# Patient Record
Sex: Female | Born: 1945 | Race: Black or African American | Hispanic: No | Marital: Single | Smoking: Former smoker
Health system: Southern US, Community
[De-identification: ages and names within clinical notes are randomized; demographics above are authoritative.]

## PROBLEM LIST (undated history)

## (undated) DIAGNOSIS — I10 Essential (primary) hypertension: Secondary | ICD-10-CM

## (undated) DIAGNOSIS — E785 Hyperlipidemia, unspecified: Secondary | ICD-10-CM

## (undated) DIAGNOSIS — M1990 Unspecified osteoarthritis, unspecified site: Secondary | ICD-10-CM

## (undated) HISTORY — DX: Unspecified osteoarthritis, unspecified site: M19.90

## (undated) HISTORY — DX: Hyperlipidemia, unspecified: E78.5

## (undated) HISTORY — DX: Essential (primary) hypertension: I10

---

## 2000-10-09 ENCOUNTER — Encounter: Admission: RE | Admit: 2000-10-09 | Discharge: 2000-10-09 | Payer: Self-pay | Admitting: Family Medicine

## 2000-10-09 ENCOUNTER — Encounter: Payer: Self-pay | Admitting: Family Medicine

## 2003-02-11 ENCOUNTER — Encounter: Admission: RE | Admit: 2003-02-11 | Discharge: 2003-02-11 | Payer: Self-pay | Admitting: Family Medicine

## 2003-02-11 ENCOUNTER — Encounter: Payer: Self-pay | Admitting: Family Medicine

## 2005-12-22 ENCOUNTER — Encounter: Admission: RE | Admit: 2005-12-22 | Discharge: 2005-12-22 | Payer: Self-pay | Admitting: Family Medicine

## 2007-04-06 IMAGING — CT CT HEAD WO/W CM
1 of 2 series · 13 of 30 positions shown, 17 images · IV contrast (omnipaque)
Comparison: none

CLINICAL DATA: Vertigo.
 HEAD CT WITHOUT AND WITH CONTRAST:
TECHNIQUE: Contiguous axial images were obtained from the base of the skull through the vertex according to standard protocol before and after administration of intravenous contrast.
 Contrast:  75 cc Omnipaque 300

[Series 2: brain · axial · 0.49mm/px · z∈[+33,+157]mm · 13 of 28 slices shown, 17 images]
[im 2/28  brain]
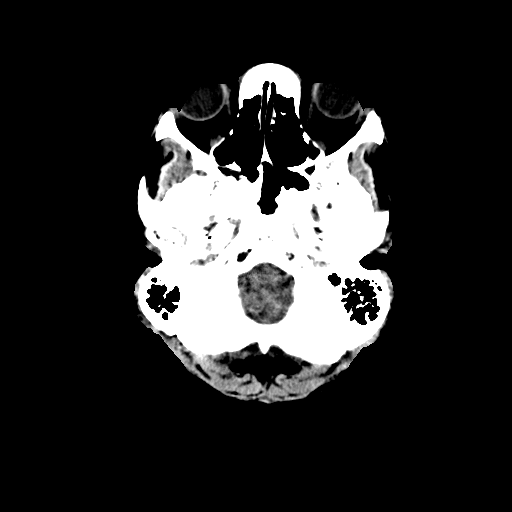
[im 2/28  bone]
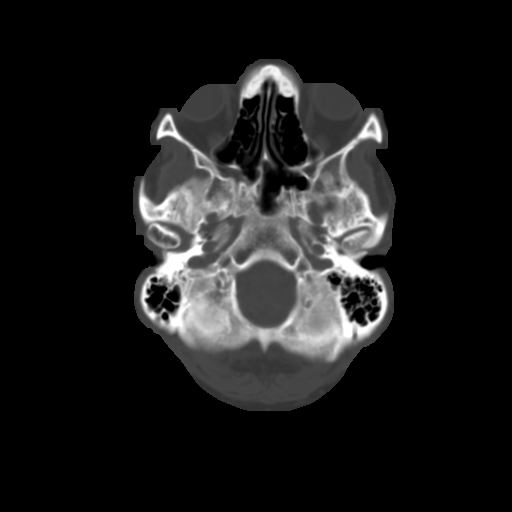
[im 4/28  brain]
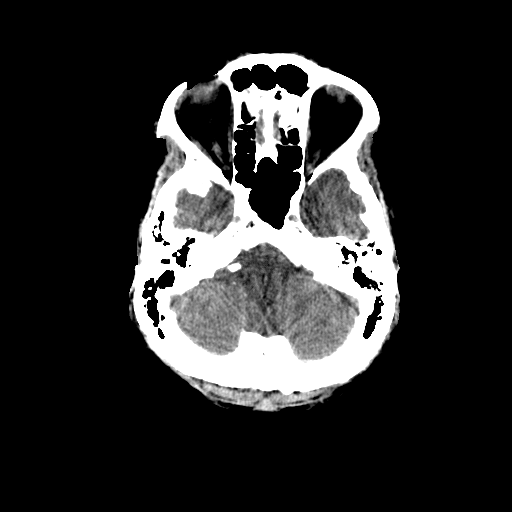
[im 6/28  brain]
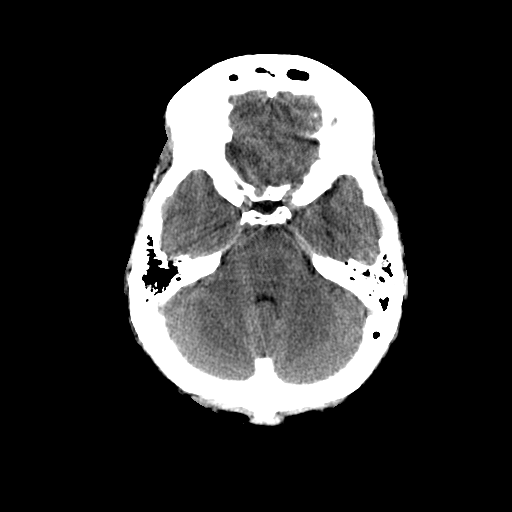
[im 8/28  brain]
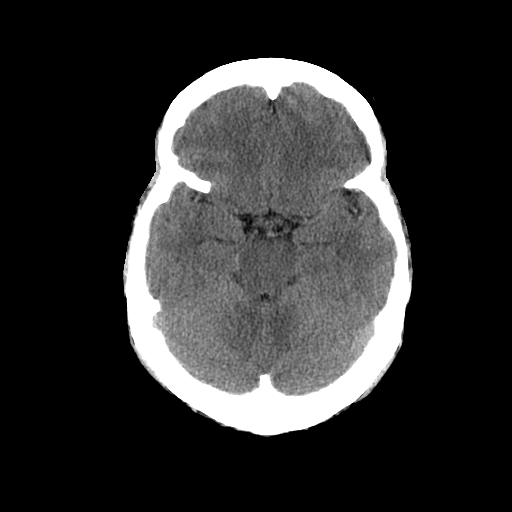
[im 10/28  brain]
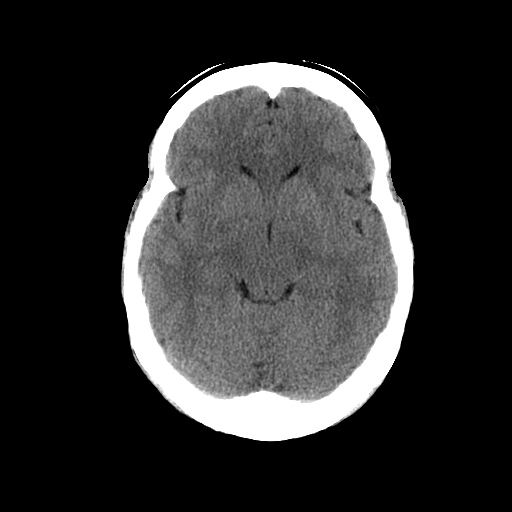
[im 10/28  bone]
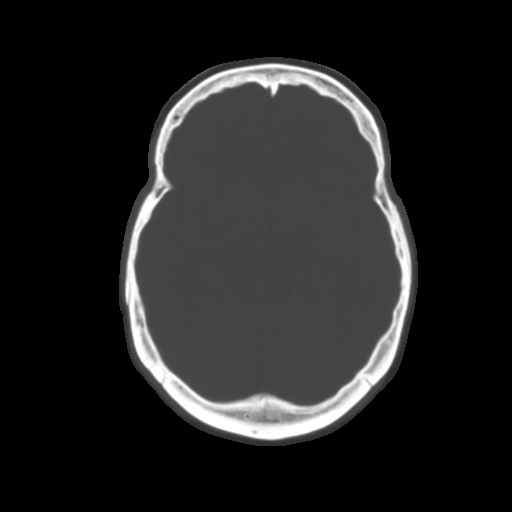
[im 12/28  brain]
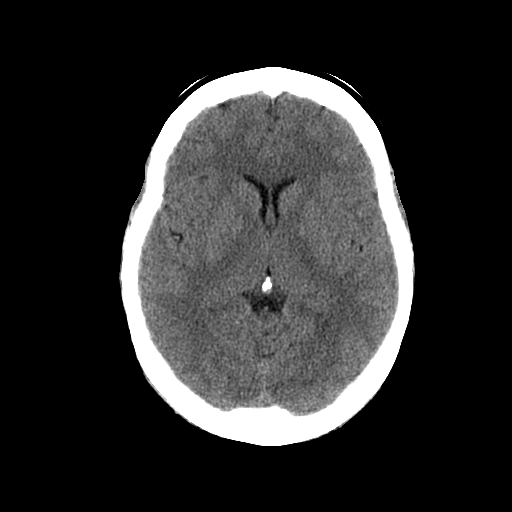
[im 14/28  brain]
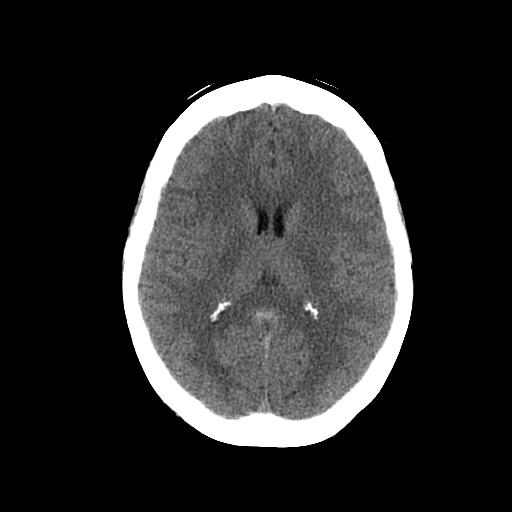
[im 16/28  brain]
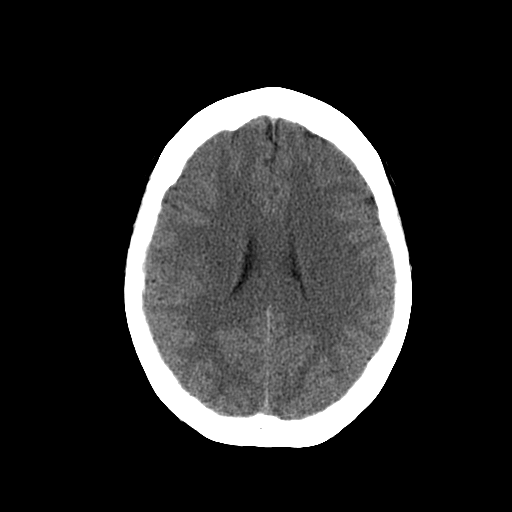
[im 18/28  brain]
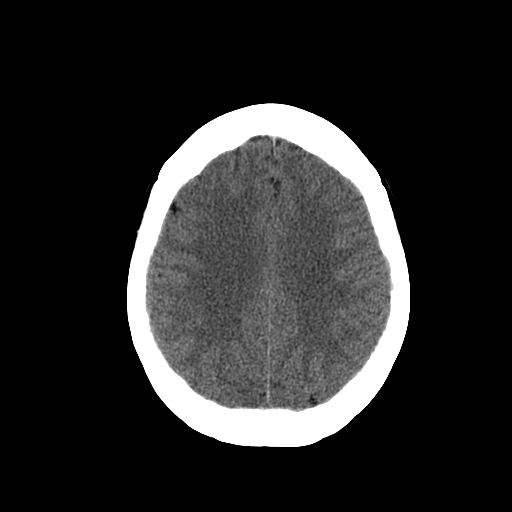
[im 18/28  bone]
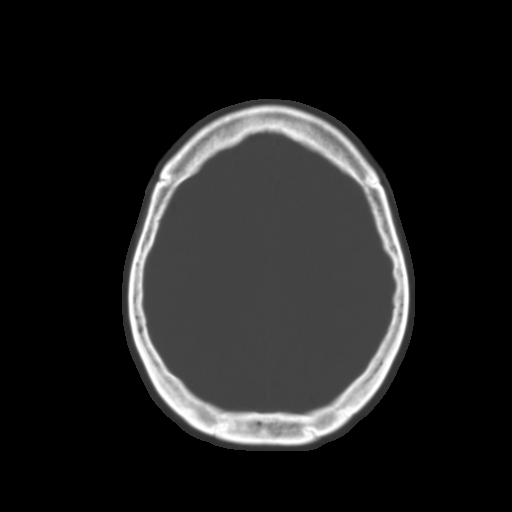
[im 20/28  brain]
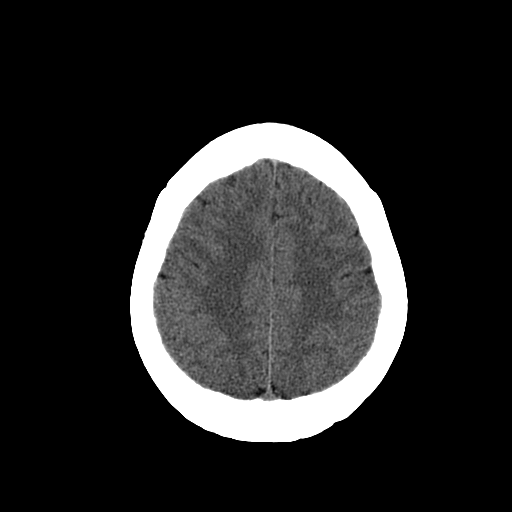
[im 22/28  brain]
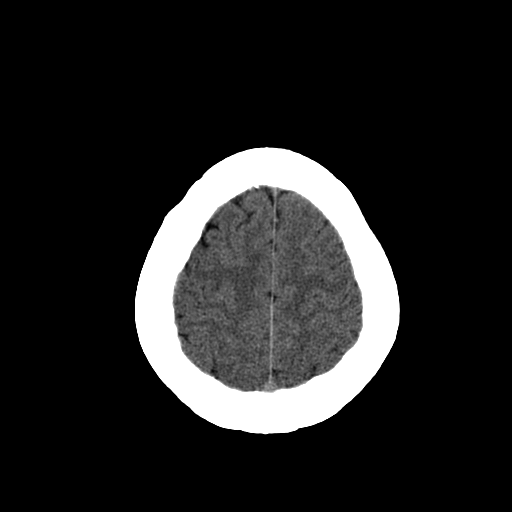
[im 24/28  brain]
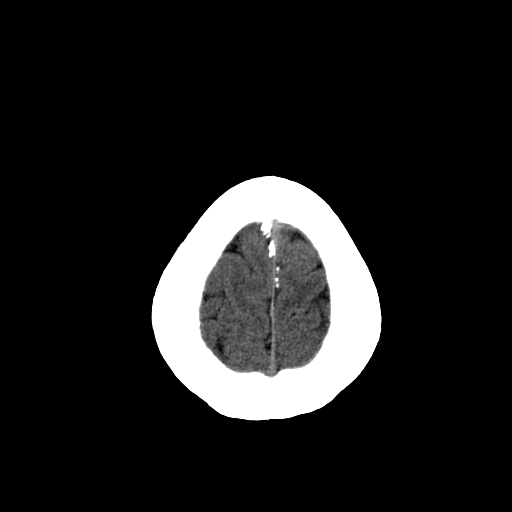
[im 26/28  brain]
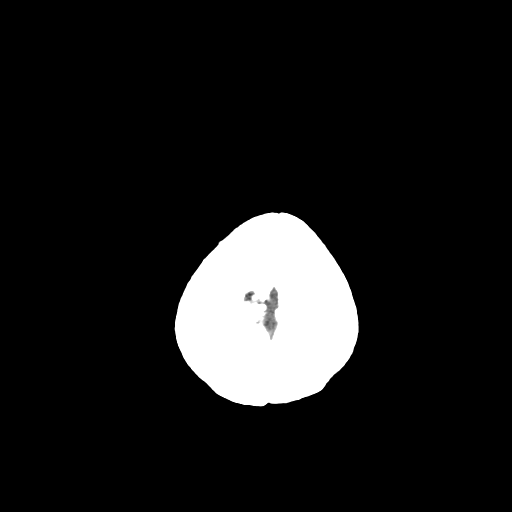
[im 26/28  bone]
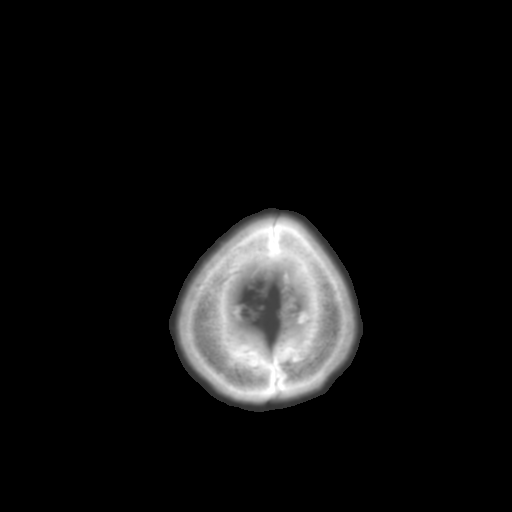

[13 of 30 positions shown; findings below may reference images not displayed]

FINDINGS: On the unenhanced portion the ventricular system is normal in size and configuration and the septum is in a normal midline position.  No acute intracranial abnormality is seen and no mass effect is noted.  After contrast administration, no enhancing lesion is seen.  On bone window images no bony abnormality is seen.  The paranasal sinuses and the mastoid air cells appear normally pneumatized.
IMPRESSION: Negative CT brain scan.  No mass or enhancing lesion is seen.

## 2011-12-26 ENCOUNTER — Other Ambulatory Visit: Payer: Self-pay | Admitting: Family Medicine

## 2011-12-26 ENCOUNTER — Ambulatory Visit
Admission: RE | Admit: 2011-12-26 | Discharge: 2011-12-26 | Disposition: A | Discharge status: Home / Self Care | Payer: Medicare Other | Source: Ambulatory Visit | Attending: Family Medicine | Admitting: Family Medicine

## 2011-12-26 DIAGNOSIS — I10 Essential (primary) hypertension: Secondary | ICD-10-CM

## 2011-12-26 DIAGNOSIS — M545 Low back pain: Secondary | ICD-10-CM

## 2012-05-28 ENCOUNTER — Other Ambulatory Visit: Payer: Self-pay | Admitting: Family Medicine

## 2012-05-28 ENCOUNTER — Encounter: Payer: Self-pay | Admitting: Gastroenterology

## 2012-05-28 DIAGNOSIS — Z1231 Encounter for screening mammogram for malignant neoplasm of breast: Secondary | ICD-10-CM

## 2012-07-01 ENCOUNTER — Ambulatory Visit (AMBULATORY_SURGERY_CENTER): Payer: Medicare Other | Admitting: *Deleted

## 2012-07-01 VITALS — Ht 66.0 in | Wt 143.0 lb

## 2012-07-01 DIAGNOSIS — Z1211 Encounter for screening for malignant neoplasm of colon: Secondary | ICD-10-CM

## 2012-07-01 MED ORDER — SUPREP BOWEL PREP KIT 17.5-3.13-1.6 GM/177ML PO SOLN: ORAL | Status: DC

## 2012-07-09 ENCOUNTER — Ambulatory Visit: Payer: Medicare Other

## 2012-07-16 ENCOUNTER — Encounter: Payer: Self-pay | Admitting: Gastroenterology

## 2012-07-16 ENCOUNTER — Ambulatory Visit (AMBULATORY_SURGERY_CENTER): Payer: Medicare Other | Admitting: Gastroenterology

## 2012-07-16 VITALS — BP 108/71 | HR 63 | Temp 98.0°F | Resp 17 | Ht 66.0 in | Wt 143.0 lb

## 2012-07-16 DIAGNOSIS — K5730 Diverticulosis of large intestine without perforation or abscess without bleeding: Secondary | ICD-10-CM

## 2012-07-16 DIAGNOSIS — D126 Benign neoplasm of colon, unspecified: Secondary | ICD-10-CM

## 2012-07-16 DIAGNOSIS — Z1211 Encounter for screening for malignant neoplasm of colon: Secondary | ICD-10-CM

## 2012-07-16 MED ORDER — SODIUM CHLORIDE 0.9 % IV SOLN: 500.0000 mL | INTRAVENOUS | Status: DC

## 2012-07-16 NOTE — Progress Notes (Signed)
1100 a/ox3 pleased with MAC report to [employer]

## 2012-07-16 NOTE — Patient Instructions (Signed)
YOU HAD AN ENDOSCOPIC PROCEDURE TODAY AT THE [facility]: Refer to the procedure report that was given to you for any specific questions about what was found during the examination.  If the procedure report does not answer your questions, please call your gastroenterologist to clarify.  If you requested that your care partner not be given the details of your procedure findings, then the procedure report has been included in a sealed envelope for you to review at your convenience later.  YOU SHOULD EXPECT: Some feelings of bloating in the abdomen. Passage of more gas than usual.  Walking can help get rid of the air that was put into your GI tract during the procedure and reduce the bloating. If you had a lower endoscopy (such as a colonoscopy or flexible sigmoidoscopy) you may notice spotting of blood in your stool or on the toilet paper. If you underwent a bowel prep for your procedure, then you may not have a normal bowel movement for a few days.  DIET: Your first meal following the procedure should be a light meal and then it is ok to progress to your normal diet.  A half-sandwich or bowl of soup is an example of a good first meal.  Heavy or fried foods are harder to digest and may make you feel nauseous or bloated.  Likewise meals heavy in dairy and vegetables can cause extra gas to form and this can also increase the bloating.  Drink plenty of fluids but you should avoid alcoholic beverages for 24 hours.  ACTIVITY: Your care partner should take you home directly after the procedure.  You should plan to take it easy, moving slowly for the rest of the day.  You can resume normal activity the day after the procedure however you should NOT DRIVE or use heavy machinery for 24 hours (because of the sedation medicines used during the test).    SYMPTOMS TO REPORT IMMEDIATELY: A gastroenterologist can be reached at any hour.  During normal business hours, 8:30 AM to 5:00 PM Monday through Friday,  call [phone].  After hours and on weekends, please call the GI answering service at [phone] who will take a message and have the physician on call contact you.   Following lower endoscopy (colonoscopy or flexible sigmoidoscopy):  Excessive amounts of blood in the stool  Significant tenderness or worsening of abdominal pains  Swelling of the abdomen that is new, acute  Fever of 100F or higher    FOLLOW UP: If any biopsies were taken you will be contacted by phone or by letter within the next 1-3 weeks.  Call your gastroenterologist if you have not heard about the biopsies in 3 weeks.  Our staff will call the home number listed on your records the next business day following your procedure to check on you and address any questions or concerns that you may have at that time regarding the information given to you following your procedure. This is a courtesy call and so if there is no answer at the home number and we have not heard from you through the emergency physician on call, we will assume that you have returned to your regular daily activities without incident.  SIGNATURES/CONFIDENTIALITY: You and/or your care partner have signed paperwork which will be entered into your electronic medical record.  These signatures attest to the fact that that the information above on your After Visit Summary has been reviewed and is understood.  Full responsibility of the confidentiality   of this discharge information lies with you and/or your care-partner.     

## 2012-07-16 NOTE — Progress Notes (Signed)
Patient did not experience any of the following events: a burn prior to discharge; a fall within the facility; wrong site/side/patient/procedure/implant event; or a hospital transfer or hospital admission upon discharge from the facility. (G8907) Patient did not have preoperative order for IV antibiotic SSI prophylaxis. (G8918)  

## 2012-07-16 NOTE — Op Note (Signed)
[facility] [address] [place], [address]   COLONOSCOPY PROCEDURE REPORT  PATIENT: Priscilla Thornton, Priscilla Thornton  MR#: [id number] BIRTHDATE: Dec 24, 1945 , 66  yrs. old GENDER: Female ENDOSCOPIST: Louis Meckel, MD REFERRED OZ:HYQMV Blount, M.D. PROCEDURE DATE:  07/16/2012 PROCEDURE:   Colonoscopy with snare polypectomy ASA CLASS:   Class II INDICATIONS:average risk screening. MEDICATIONS: MAC sedation, administered by CRNA and Propofol (Diprivan) 180 mg IV  DESCRIPTION OF PROCEDURE:   After the risks benefits and alternatives of the procedure were thoroughly explained, informed consent was obtained.  A digital rectal exam revealed no abnormalities of the rectum.   The LB CF-[id number]  endoscope was introduced through the anus and advanced to the cecum, which was identified by both the appendix and ileocecal valve. No adverse events experienced.   The quality of the prep was Suprep excellent The instrument was then slowly withdrawn as the colon was fully examined.      COLON FINDINGS: A flat polyp measuring 4 mm in size was found in the ascending colon.  A polypectomy was performed with a cold snare. The resection was complete and the polyp tissue was completely retrieved.   Mild diverticulosis was noted in the sigmoid colon. The colon mucosa was otherwise normal.  Retroflexed views revealed no abnormalities. The time to cecum=3 minutes 03 seconds. Withdrawal time=11 minutes 23 seconds.  The scope was withdrawn and the procedure completed. COMPLICATIONS: There were no complications.  ENDOSCOPIC IMPRESSION: 1.   Flat polyp measuring 4 mm in size was found in the ascending colon; polypectomy was performed with a cold snare 2.   Mild diverticulosis was noted in the sigmoid colon 3.   The colon mucosa was otherwise normal  RECOMMENDATIONS: If the polyp(s) removed today are proven to be adenomatous (pre-cancerous) polyps, you will need a repeat colonoscopy  in 5 years.  Otherwise you should continue to follow colorectal cancer screening guidelines for "routine risk" patients with colonoscopy in 10 years.  You will receive a letter within 1-2 weeks with the results of your biopsy as well as final recommendations.  Please call my office if you have not received a letter after 3 weeks.   eSigned:  Louis Meckel, MD 07/16/2012 11:00 AM   cc:   PATIENT NAME:  Priscilla Thornton, Priscilla Thornton MR#: [id number]

## 2012-07-17 ENCOUNTER — Telehealth: Payer: Self-pay | Admitting: *Deleted

## 2012-07-17 NOTE — Telephone Encounter (Signed)
  Follow up Call-  Call back number 07/16/2012  Post procedure Call Back phone  # [phone]  Permission to leave phone message Yes    [facility]

## 2012-07-23 ENCOUNTER — Encounter: Payer: Self-pay | Admitting: Gastroenterology

## 2012-08-12 ENCOUNTER — Ambulatory Visit
Admission: RE | Admit: 2012-08-12 | Discharge: 2012-08-12 | Disposition: A | Discharge status: Home / Self Care | Payer: Medicare Other | Source: Ambulatory Visit | Attending: Family Medicine | Admitting: Family Medicine

## 2012-08-12 DIAGNOSIS — Z1231 Encounter for screening mammogram for malignant neoplasm of breast: Secondary | ICD-10-CM

## 2012-08-13 ENCOUNTER — Other Ambulatory Visit: Payer: Self-pay | Admitting: Family Medicine

## 2012-08-13 DIAGNOSIS — R928 Other abnormal and inconclusive findings on diagnostic imaging of breast: Secondary | ICD-10-CM

## 2016-03-10 ENCOUNTER — Other Ambulatory Visit: Payer: Self-pay | Admitting: Internal Medicine

## 2016-03-10 DIAGNOSIS — R946 Abnormal results of thyroid function studies: Secondary | ICD-10-CM

## 2016-03-28 ENCOUNTER — Ambulatory Visit (HOSPITAL_COMMUNITY)
Admission: RE | Admit: 2016-03-28 | Discharge: 2016-03-28 | Disposition: A | Discharge status: Home / Self Care | Payer: Medicare Other | Source: Ambulatory Visit | Attending: Internal Medicine | Admitting: Internal Medicine

## 2016-03-28 DIAGNOSIS — R946 Abnormal results of thyroid function studies: Secondary | ICD-10-CM

## 2016-03-29 ENCOUNTER — Encounter (HOSPITAL_COMMUNITY)
Admission: RE | Admit: 2016-03-29 | Discharge: 2016-03-29 | Disposition: A | Discharge status: Home / Self Care | Payer: Medicare Other | Source: Ambulatory Visit | Attending: Internal Medicine | Admitting: Internal Medicine

## 2016-03-29 DIAGNOSIS — R946 Abnormal results of thyroid function studies: Secondary | ICD-10-CM | POA: Diagnosis not present

## 2016-03-29 MED ORDER — SODIUM IODIDE I 131 CAPSULE
12.8000 | Freq: Once | INTRAVENOUS | Status: AC | PRN
  Administered 2016-03-28: 12.8 via ORAL

## 2016-03-29 MED ORDER — SODIUM PERTECHNETATE TC 99M INJECTION
10.5000 | Freq: Once | INTRAVENOUS | Status: AC | PRN
  Administered 2016-03-29: 10.5 via INTRAVENOUS

## 2017-08-15 ENCOUNTER — Encounter: Payer: Self-pay | Admitting: Gastroenterology

## 2018-05-07 ENCOUNTER — Other Ambulatory Visit: Payer: Self-pay | Admitting: Nurse Practitioner

## 2018-06-03 ENCOUNTER — Ambulatory Visit: Payer: Medicare Other | Admitting: Nurse Practitioner

## 2018-06-03 ENCOUNTER — Encounter: Payer: Self-pay | Admitting: Nurse Practitioner

## 2018-06-03 VITALS — BP 106/72 | HR 62 | Temp 98.3°F | Ht 64.0 in | Wt 140.2 lb

## 2018-06-03 DIAGNOSIS — I10 Essential (primary) hypertension: Secondary | ICD-10-CM | POA: Insufficient documentation

## 2018-06-03 DIAGNOSIS — M1990 Unspecified osteoarthritis, unspecified site: Secondary | ICD-10-CM | POA: Diagnosis not present

## 2018-06-03 DIAGNOSIS — Z7982 Long term (current) use of aspirin: Secondary | ICD-10-CM

## 2018-06-03 DIAGNOSIS — E0590 Thyrotoxicosis, unspecified without thyrotoxic crisis or storm: Secondary | ICD-10-CM | POA: Diagnosis not present

## 2018-06-03 LAB — CMP14 + ANION GAP
ALBUMIN: 4.4 g/dL (ref 3.5–4.8)
ALT: 17 IU/L (ref 0–32)
ANION GAP: 16 mmol/L (ref 10.0–18.0)
AST: 22 IU/L (ref 0–40)
Albumin/Globulin Ratio: 1.7 (ref 1.2–2.2)
Alkaline Phosphatase: 56 IU/L (ref 39–117)
BUN/Creatinine Ratio: 13 (ref 12–28)
BUN: 13 mg/dL (ref 8–27)
Bilirubin Total: 0.5 mg/dL (ref 0.0–1.2)
CALCIUM: 10.3 mg/dL (ref 8.7–10.3)
CO2: 25 mmol/L (ref 20–29)
CREATININE: 1.04 mg/dL — AB (ref 0.57–1.00)
Chloride: 102 mmol/L (ref 96–106)
GFR calc Af Amer: 62 mL/min/{1.73_m2} (ref >59)
GFR, EST NON AFRICAN AMERICAN: 54 mL/min/{1.73_m2} — AB (ref >59)
GLOBULIN, TOTAL: 2.6 g/dL (ref 1.5–4.5)
Glucose: 103 mg/dL — ABNORMAL HIGH (ref 65–99)
Potassium: 4 mmol/L (ref 3.5–5.2)
SODIUM: 143 mmol/L (ref 134–144)
Total Protein: 7 g/dL (ref 6.0–8.5)

## 2018-06-03 NOTE — Progress Notes (Signed)
Subjective:     Patient ID: Priscilla Thornton , female    DOB: 01/19/1946 , 72 y.o.   MRN: [id number]   Chief Complaint  Patient presents with  . Hypertension    HPI  Hypertension  This is a chronic problem. Pertinent negatives include no chest pain, headaches, palpitations or shortness of breath. There are no associated agents to hypertension. There are no known risk factors for coronary artery disease. Past treatments include angiotensin blockers. The current treatment provides moderate improvement. There are no compliance problems.  There is no history of angina, kidney disease or CVA. Identifiable causes of hypertension include a thyroid problem. There is no history of chronic renal disease.     Past Medical History:  Diagnosis Date  . Arthritis   . Hyperlipidemia   . Hypertension      Family History  Problem Relation Age of Onset  . Diabetes Mother   . Heart disease Father   . Colon cancer Neg Hx   . Esophageal cancer Neg Hx   . Rectal cancer Neg Hx   . Stomach cancer Neg Hx      Current Outpatient Medications:  .  aspirin 81 MG tablet, Take 81 mg by mouth daily., Disp: , Rfl:  .  atorvastatin (LIPITOR) 20 MG tablet, Take 20 mg by mouth daily., Disp: , Rfl:  .  fish oil-omega-3 fatty acids 1000 MG capsule, Take 1 g by mouth daily., Disp: , Rfl:  .  folic acid (FOLVITE) 1 MG tablet, Take 1 mg by mouth daily., Disp: , Rfl:  .  ibuprofen (ADVIL,MOTRIN) 200 MG tablet, Take 200 mg by mouth every 6 (six) hours as needed., Disp: , Rfl:  .  methotrexate (50 MG/ML) 1 g injection, Inject 1 mg into the vein once a week., Disp: , Rfl:  .  Multiple Vitamins-Calcium (ONE-A-DAY WOMENS FORMULA PO), Take 1 tablet by mouth daily., Disp: , Rfl:  .  potassium chloride (K-DUR,KLOR-CON) 10 MEQ tablet, TAKE 1 TABLET BY MOUTH EVERY DAY WITH FOOD, Disp: 90 tablet, Rfl: 0 .  valsartan-hydrochlorothiazide (DIOVAN-HCT) 160-12.5 MG per tablet, Take 1 tablet by mouth daily., Disp: , Rfl:    No  Known Allergies   Review of Systems  Constitutional: Negative.  Negative for fatigue.  Eyes: Negative for visual disturbance.  Respiratory: Negative.  Negative for shortness of breath.   Cardiovascular: Negative.  Negative for chest pain, palpitations and leg swelling.  Gastrointestinal: Negative.   Endocrine: Negative.   Musculoskeletal: Negative.   Skin: Negative.   Neurological: Negative for dizziness, weakness and headaches.  Psychiatric/Behavioral: Negative for confusion. The patient is not nervous/anxious.      Today's Vitals   06/03/18 1025  BP: 106/72  Pulse: 62  Temp: 98.3 F (36.8 C)  TempSrc: Oral  SpO2: 98%  Weight: 140 lb 3.2 oz (63.6 kg)  Height: 5\' 4"  (1.626 m)  PainSc: 0-No pain   Body mass index is 24.07 kg/m.   Objective:  Physical Exam  Constitutional: She is oriented to person, place, and time. She appears well-developed and well-nourished.  Eyes: Pupils are equal, round, and reactive to light.  Cardiovascular: Normal rate, regular rhythm, normal heart sounds and intact distal pulses.  No murmur heard. Pulmonary/Chest: Effort normal and breath sounds normal.  Musculoskeletal: Normal range of motion. She exhibits no edema.  Neurological: She is alert and oriented to person, place, and time. No cranial nerve deficit.  Skin: Skin is warm and dry. Capillary refill takes less than 2  seconds.  Psychiatric: She has a normal mood and affect.  Vitals reviewed.       Assessment And Plan:     1. Essential hypertension  Chronic, controlled  Continue with current medications  2. Hyperthyroidism  Chronic  Continue follow up with Dr. Chalmers Cater  Continue with your methimazole daily  3. Arthritis  Managed by Dr Noemi Chapel  Continue methotrexate       [name], FNP

## 2018-06-03 NOTE — Patient Instructions (Signed)

## 2018-06-07 ENCOUNTER — Other Ambulatory Visit: Payer: Self-pay

## 2018-06-07 NOTE — Patient Outreach (Signed)
[facility]) Care Management  06/07/2018  Priscilla Thornton 28-Mar-1946 [id number]   Medication Adherence call to Mrs. Priscilla Thornton left a message for patient to call back patient is due on Losartan/ HCTZ 100/12.5 mg. Mrs. Priscilla Thornton is showing past due under [place].   [place] Management Direct Dial [phone]  Fax [phone] [email or web address]

## 2018-06-10 ENCOUNTER — Other Ambulatory Visit: Payer: Self-pay

## 2018-06-10 MED ORDER — LOSARTAN POTASSIUM-HCTZ 100-12.5 MG PO TABS: 1.0000 | ORAL_TABLET | Freq: Every day | ORAL | 1 refills | Status: DC

## 2018-06-19 ENCOUNTER — Other Ambulatory Visit: Payer: Self-pay | Admitting: Nurse Practitioner

## 2018-06-19 DIAGNOSIS — I10 Essential (primary) hypertension: Secondary | ICD-10-CM

## 2018-06-19 MED ORDER — HYDROCHLOROTHIAZIDE 12.5 MG PO TABS: 12.5000 mg | ORAL_TABLET | Freq: Every day | ORAL | 1 refills | Status: DC

## 2018-06-19 MED ORDER — LOSARTAN POTASSIUM 100 MG PO TABS: 100.0000 mg | ORAL_TABLET | Freq: Every day | ORAL | 1 refills | Status: DC

## 2018-08-01 ENCOUNTER — Other Ambulatory Visit: Payer: Self-pay | Admitting: Nurse Practitioner

## 2018-08-29 ENCOUNTER — Other Ambulatory Visit: Payer: Self-pay | Admitting: Nurse Practitioner

## 2018-12-03 ENCOUNTER — Other Ambulatory Visit: Payer: Self-pay | Admitting: Nurse Practitioner

## 2018-12-11 ENCOUNTER — Other Ambulatory Visit: Payer: Self-pay | Admitting: Nurse Practitioner

## 2018-12-11 DIAGNOSIS — I10 Essential (primary) hypertension: Secondary | ICD-10-CM

## 2018-12-19 ENCOUNTER — Other Ambulatory Visit: Payer: Self-pay

## 2018-12-19 ENCOUNTER — Ambulatory Visit: Payer: Medicare Other | Admitting: Nurse Practitioner

## 2018-12-19 ENCOUNTER — Ambulatory Visit (INDEPENDENT_AMBULATORY_CARE_PROVIDER_SITE_OTHER): Payer: Medicare Other

## 2018-12-19 VITALS — BP 118/78 | HR 72 | Temp 98.7°F | Ht 64.6 in | Wt 134.4 lb

## 2018-12-19 DIAGNOSIS — D179 Benign lipomatous neoplasm, unspecified: Secondary | ICD-10-CM

## 2018-12-19 DIAGNOSIS — I10 Essential (primary) hypertension: Secondary | ICD-10-CM

## 2018-12-19 DIAGNOSIS — Z0000 Encounter for general adult medical examination without abnormal findings: Secondary | ICD-10-CM

## 2018-12-19 DIAGNOSIS — D229 Melanocytic nevi, unspecified: Secondary | ICD-10-CM

## 2018-12-19 DIAGNOSIS — E782 Mixed hyperlipidemia: Secondary | ICD-10-CM

## 2018-12-19 DIAGNOSIS — M1990 Unspecified osteoarthritis, unspecified site: Secondary | ICD-10-CM | POA: Diagnosis not present

## 2018-12-19 DIAGNOSIS — E0590 Thyrotoxicosis, unspecified without thyrotoxic crisis or storm: Secondary | ICD-10-CM | POA: Diagnosis not present

## 2018-12-19 LAB — POCT URINALYSIS DIPSTICK
Bilirubin, UA: NEGATIVE
Glucose, UA: NEGATIVE
Ketones, UA: NEGATIVE
Nitrite, UA: NEGATIVE
Protein, UA: POSITIVE — AB
Spec Grav, UA: 1.02 (ref 1.010–1.025)
Urobilinogen, UA: 0.2 E.U./dL
pH, UA: 5.5 (ref 5.0–8.0)

## 2018-12-19 LAB — POCT UA - MICROALBUMIN
Creatinine, POC: 300 mg/dL
Microalbumin Ur, POC: 80 mg/L

## 2018-12-19 NOTE — Progress Notes (Signed)
Subjective:   Priscilla Thornton is a 73 y.o. female who presents for Medicare Annual (Subsequent) preventive examination.  Review of Systems:  n/a Cardiac Risk Factors include: advanced age (>16men, >64 women);hypertension;sedentary lifestyle     Objective:     Vitals: BP 118/78 (BP Location: Left Arm, Patient Position: Sitting, Cuff Size: Normal)    Pulse 72    Temp 98.7 F (37.1 C) (Oral)    Ht 5' 4.6" (1.641 m)    Wt 134 lb 6.4 oz (61 kg)    SpO2 98%    BMI 22.64 kg/m   Body mass index is 22.64 kg/m.  Advanced Directives 12/19/2018  Does Patient Have a Medical Advance Directive? No  Would patient like information on creating a medical advance directive? No - Patient declined    Tobacco Social History   Tobacco Use  Smoking Status Former Smoker   Packs/day: 0.20   Types: Cigarettes   Quit date: 2016   Years since quitting: 4.4  Smokeless Tobacco Never Used  Tobacco Comment   2-3 cigaretts a day     Counseling given: Not Answered Comment: 2-3 cigaretts a day   Clinical Intake:  Pre-visit preparation completed: Yes  Pain : No/denies pain Pain Score: 0-No pain     Nutritional Status: BMI of 19-24  Normal Nutritional Risks: None Diabetes: No  How often do you need to have someone help you when you read instructions, pamphlets, or other written materials from your doctor or pharmacy?: 1 - Never What is the last grade level you completed in school?: some college  Interpreter Needed?: No  Information entered by :: NAllen LPN  Past Medical History:  Diagnosis Date   Arthritis    Hyperlipidemia    Hypertension    History reviewed. No pertinent surgical history. Family History  Problem Relation Age of Onset   Diabetes Mother    Heart disease Father    Colon cancer Neg Hx    Esophageal cancer Neg Hx    Rectal cancer Neg Hx    Stomach cancer Neg Hx    Social History   Socioeconomic History   Marital status: Single    Spouse name: Not  on file   Number of children: Not on file   Years of education: Not on file   Highest education level: Not on file  Occupational History   Not on file  Social Needs   Financial resource strain: Not hard at all   Food insecurity    Worry: Never true    Inability: Never true   Transportation needs    Medical: No    Non-medical: No  Tobacco Use   Smoking status: Former Smoker    Packs/day: 0.20    Types: Cigarettes    Quit date: 2016    Years since quitting: 4.4   Smokeless tobacco: Never Used   Tobacco comment: 2-3 cigaretts a day  Substance and Sexual Activity   Alcohol use: No   Drug use: No   Sexual activity: Not Currently  Lifestyle   Physical activity    Days per week: 0 days    Minutes per session: 0 min   Stress: Not at all  Relationships   Social connections    Talks on phone: Not on file    Gets together: Not on file    Attends religious service: Not on file    Active member of club or organization: Not on file    Attends meetings of clubs or  organizations: Not on file    Relationship status: Not on file  Other Topics Concern   Not on file  Social History Narrative   Not on file    Outpatient Encounter Medications as of 12/19/2018  Medication Sig   aspirin 81 MG tablet Take 81 mg by mouth daily.   atorvastatin (LIPITOR) 20 MG tablet TAKE 1 TABLET BY MOUTH EVERY DAY   fish oil-omega-3 fatty acids 1000 MG capsule Take 1 g by mouth daily.   folic acid (FOLVITE) 1 MG tablet Take 1 mg by mouth daily.   ibuprofen (ADVIL,MOTRIN) 200 MG tablet Take 200 mg by mouth every 6 (six) hours as needed.   losartan-hydrochlorothiazide (HYZAAR) 100-12.5 MG tablet Take 1 tablet by mouth daily.   methimazole (TAPAZOLE) 5 MG tablet Take 5 mg by mouth daily.   methotrexate (50 MG/ML) 1 g injection Inject 1 mg into the vein once a week.   Multiple Vitamins-Calcium (ONE-A-DAY WOMENS FORMULA PO) Take 1 tablet by mouth daily.   potassium chloride (K-DUR)  10 MEQ tablet TAKE 1 TABLET BY MOUTH EVERY DAY WITH FOOD   hydrochlorothiazide (HYDRODIURIL) 12.5 MG tablet TAKE 1 TABLET(12.5 MG) BY MOUTH DAILY (Patient not taking: Reported on 12/19/2018)   losartan (COZAAR) 100 MG tablet TAKE 1 TABLET(100 MG) BY MOUTH DAILY (Patient not taking: Reported on 12/19/2018)   valsartan-hydrochlorothiazide (DIOVAN-HCT) 160-12.5 MG per tablet Take 1 tablet by mouth daily.   No facility-administered encounter medications on file as of 12/19/2018.     Activities of Daily Living In your present state of health, do you have any difficulty performing the following activities: 12/19/2018  Hearing? N  Vision? N  Difficulty concentrating or making decisions? Y  Comment some forgetfulness  Walking or climbing stairs? N  Dressing or bathing? N  Doing errands, shopping? N  Preparing Food and eating ? N  Using the Toilet? N  In the past six months, have you accidently leaked urine? N  Do you have problems with loss of bowel control? N  Managing your Medications? N  Managing your Finances? N  Housekeeping or managing your Housekeeping? N  Some recent data might be hidden    Patient Care Team: [name], FNP as PCP - General (General Practice)    Assessment:   This is a routine wellness examination for [place].  Exercise Activities and Dietary recommendations Current Exercise Habits: The patient does not participate in regular exercise at present  Goals     Patient Stated     Stay healthy       Fall Risk Fall Risk  12/19/2018 06/03/2018  Falls in the past year? 0 0  Risk for fall due to : Medication side effect -  Follow up Falls prevention discussed;Education provided -   Is the patient's home free of loose throw rugs in walkways, pet beds, electrical cords, etc?   yes      Grab bars in the bathroom? no      Handrails on the stairs?   n/a      Adequate lighting?   yes  Timed Get Up and Go performed: n/a  Depression Screen PHQ 2/9 Scores  12/19/2018 06/03/2018  PHQ - 2 Score 0 0  PHQ- 9 Score 1 -     Cognitive Function     6CIT Screen 12/19/2018  What Year? 0 points  What month? 0 points  What time? 0 points  Count back from 20 0 points  Months in reverse 4 points  Repeat  phrase 2 points  Total Score 6     There is no immunization history on file for this patient.  Qualifies for Shingles Vaccine? yes  Screening Tests Health Maintenance  Topic Date Due   Hepatitis C Screening  [birth year]   COLONOSCOPY  07/16/2017   MAMMOGRAM  12/19/2019 (Originally 08/12/2014)   TETANUS/TDAP  12/19/2019 (Originally 09/18/1965)   PNA vac Low Risk Adult (1 of 2 - PCV13) 12/19/2019 (Originally 09/19/2011)   INFLUENZA VACCINE  02/08/2019   DEXA SCAN  Completed    Cancer Screenings: Lung: Low Dose CT Chest recommended if Age 57-80 years, 30 pack-year currently smoking OR have quit w/in 15years. Patient does not qualify. Breast:  Up to date on Mammogram? No   Up to date of Bone Density/Dexa? Yes Colorectal: up to date  Additional Screenings: : Hepatitis C Screening: scheduled today     Plan:    Wants to stay healthy.   I have personally reviewed and noted the following in the patients chart:    Medical and social history  Use of alcohol, tobacco or illicit drugs   Current medications and supplements  Functional ability and status  Nutritional status  Physical activity  Advanced directives  List of other physicians  Hospitalizations, surgeries, and ER visits in previous 12 months  Vitals  Screenings to include cognitive, depression, and falls  Referrals and appointments  In addition, I have reviewed and discussed with patient certain preventive protocols, quality metrics, and best practice recommendations. A written personalized care plan for preventive services as well as general preventive health recommendations were provided to patient.     Kellie Simmering, LPN  [id number]

## 2018-12-19 NOTE — Progress Notes (Addendum)
Subjective:     Patient ID: Priscilla Thornton , female    DOB: [birth year] , 73 y.o.   MRN: [id number]   Chief Complaint  Patient presents with  . Annual Exam   The patient states she uses post menopausal status for birth control. Last LMP was No LMP recorded. Patient is postmenopausal.. Negative for Dysmenorrhea and Negative for Menorrhagia Mammogram last done 2014 she chooses not to do them. Negative for: breast discharge, breast lump(s), breast pain and breast self exam.  Pertinent negatives include abnormal bleeding (hematology), anxiety, decreased libido, depression, difficulty falling sleep, dyspareunia, history of infertility, nocturia, sexual dysfunction, sleep disturbances, urinary incontinence, urinary urgency, vaginal discharge and vaginal itching. Diet regular.The patient states her exercise level is      The patient's tobacco use is:    Social History   Tobacco Use  Smoking Status Former Smoker  . Packs/day: 0.20  . Types: Cigarettes  . Quit date: 2016  . Years since quitting: 4.4  Smokeless Tobacco Never Used  Tobacco Comment   2-3 cigaretts a day   She has been exposed to passive smoke. The patient's alcohol use is:  Social History   Substance and Sexual Activity  Alcohol Use No   Additional information: she is no longer having PAPs by her choice.  HPI  Hyperlipidemia -   Seen Dr. Candise Bowens - arthritis doctor seen this week.  Dr. Chalmers Cater next week for thyroid.    Dr. Gershon Crane - eye - seen last week.  Hypertension The current episode started more than 1 year ago. The problem is controlled. Pertinent negatives include no anxiety. Risk factors for coronary artery disease include dyslipidemia.     Past Medical History:  Diagnosis Date  . Arthritis   . Hyperlipidemia   . Hypertension      Family History  Problem Relation Age of Onset  . Diabetes Mother   . Heart disease Father   . Colon cancer Neg Hx   . Esophageal cancer Neg Hx   . Rectal cancer Neg Hx    . Stomach cancer Neg Hx      Current Outpatient Medications:  .  aspirin 81 MG tablet, Take 81 mg by mouth daily., Disp: , Rfl:  .  atorvastatin (LIPITOR) 20 MG tablet, TAKE 1 TABLET BY MOUTH EVERY DAY, Disp: 90 tablet, Rfl: 1 .  fish oil-omega-3 fatty acids 1000 MG capsule, Take 1 g by mouth daily., Disp: , Rfl:  .  folic acid (FOLVITE) 1 MG tablet, Take 1 mg by mouth daily., Disp: , Rfl:  .  hydrochlorothiazide (HYDRODIURIL) 12.5 MG tablet, TAKE 1 TABLET(12.5 MG) BY MOUTH DAILY (Patient not taking: Reported on 12/19/2018), Disp: 90 tablet, Rfl: 1 .  ibuprofen (ADVIL,MOTRIN) 200 MG tablet, Take 200 mg by mouth every 6 (six) hours as needed., Disp: , Rfl:  .  losartan (COZAAR) 100 MG tablet, TAKE 1 TABLET(100 MG) BY MOUTH DAILY (Patient not taking: Reported on 12/19/2018), Disp: 90 tablet, Rfl: 1 .  losartan-hydrochlorothiazide (HYZAAR) 100-12.5 MG tablet, Take 1 tablet by mouth daily., Disp: 90 tablet, Rfl: 1 .  methimazole (TAPAZOLE) 5 MG tablet, Take 5 mg by mouth daily., Disp: , Rfl:  .  methotrexate (50 MG/ML) 1 g injection, Inject 1 mg into the vein once a week., Disp: , Rfl:  .  Multiple Vitamins-Calcium (ONE-A-DAY WOMENS FORMULA PO), Take 1 tablet by mouth daily., Disp: , Rfl:  .  potassium chloride (K-DUR) 10 MEQ tablet, TAKE 1 TABLET BY MOUTH EVERY  DAY WITH FOOD, Disp: 90 tablet, Rfl: 0 .  valsartan-hydrochlorothiazide (DIOVAN-HCT) 160-12.5 MG per tablet, Take 1 tablet by mouth daily., Disp: , Rfl:    No Known Allergies   Review of Systems  Constitutional: Negative.   HENT: Negative.   Eyes: Negative.   Respiratory: Negative.  Negative for cough.   Cardiovascular: Negative.   Gastrointestinal: Negative.   Endocrine: Negative.   Genitourinary: Negative.   Musculoskeletal: Negative.   Skin: Negative.   Allergic/Immunologic: Negative.   Neurological: Negative.   Hematological: Negative.   Psychiatric/Behavioral: Negative.      Today's Vitals   12/19/18 0950  BP: 118/78   Pulse: 72  Temp: 98.7 F (37.1 C)  TempSrc: Oral  Weight: 134 lb 6.4 oz (61 kg)  Height: 5' 4.6" (1.641 m)   Body mass index is 22.64 kg/m.   Objective:  Physical Exam Vitals signs reviewed.  Constitutional:      Appearance: Normal appearance. She is well-developed.  HENT:     Head: Normocephalic and atraumatic.     Right Ear: Hearing, tympanic membrane, ear canal and external ear normal.     Left Ear: Hearing, tympanic membrane, ear canal and external ear normal.     Nose: Nose normal.     Mouth/Throat:     Mouth: Mucous membranes are moist.  Eyes:     General: Lids are normal.     Conjunctiva/sclera: Conjunctivae normal.     Pupils: Pupils are equal, round, and reactive to light.     Funduscopic exam:    Right eye: No papilledema.        Left eye: No papilledema.  Neck:     Musculoskeletal: Full passive range of motion without pain, normal range of motion and neck supple.     Thyroid: No thyroid mass.     Vascular: No carotid bruit.  Cardiovascular:     Rate and Rhythm: Normal rate and regular rhythm.     Pulses: Normal pulses.     Heart sounds: Normal heart sounds. No murmur.  Pulmonary:     Effort: Pulmonary effort is normal.     Breath sounds: Normal breath sounds.  Abdominal:     General: Abdomen is flat. Bowel sounds are normal.     Palpations: Abdomen is soft.  Musculoskeletal: Normal range of motion.        General: No swelling.     Right lower leg: No edema.     Left lower leg: No edema.  Skin:    General: Skin is warm and dry.     Capillary Refill: Capillary refill takes less than 2 seconds.     Findings: Lesion (irregular shaped nevi to upper left posterior ) present.     Comments: Large soft tissue lump approximately 10cm x 7 cmto left upper back  Neurological:     General: No focal deficit present.     Mental Status: She is alert and oriented to person, place, and time.     Cranial Nerves: No cranial nerve deficit.     Sensory: No sensory  deficit.  Psychiatric:        Mood and Affect: Mood normal.        Behavior: Behavior normal.        Thought Content: Thought content normal.        Judgment: Judgment normal.         Assessment And Plan:    1. Health maintenance examination . Behavior modifications discussed and diet history reviewed.   Marland Kitchen  Pt will continue to exercise regularly and modify diet with low GI, plant based foods and decrease intake of processed foods.  . Recommend intake of daily multivitamin, Vitamin D, and calcium.  . Recommend mammogram and colonoscopy for preventive screenings, as well as recommend immunizations that include influenza, TDAP, and Shingles  2. Essential hypertension . B/P is well controlled.  . CMP ordered to check renal function.  . The importance of regular exercise and dietary modification was stressed to the patient.  . EKG done no acute changes - POCT Urinalysis Dipstick (81002) - POCT UA - Microalbumin - EKG 12-Lead - CBC no Diff - CMP14 + Anion Gap  3. Hyperthyroidism  Chronic, controlled  Continue with current medications  Continue with follow up with Dr. Chalmers Cater  4. Arthritis  Chronic,   Continue you follow up with Rheumatology  5. Atypical nevi  She has an irregular raised nevi to left posterior back she noticed this worsened in the last several months.   She is fair skinned and I would like to refer her to dermatology for further evaluation  6. Lipoma, unspecified site  She has a large soft tissue raised area to her back approximately 10cm x 7cm to left upper back  Denies any discomfort  Advised if has problems or worsens to return to office she may need an ultrasound and referral to surgery  7. Mixed hyperlipidemia  Chronic, controlled  Continue with current medications - Lipid Profile        [name], FNP    THE PATIENT IS ENCOURAGED TO PRACTICE SOCIAL DISTANCING DUE TO THE COVID-19 PANDEMIC.

## 2018-12-19 NOTE — Patient Instructions (Signed)
Priscilla Thornton , Thank you for taking time to come for your Medicare Wellness Visit. I appreciate your ongoing commitment to your health goals. Please review the following plan we discussed and let me know if I can assist you in the future.   Screening recommendations/referrals: Colonoscopy: 07/2012 Mammogram: declines Bone Density: 12/2015 Recommended yearly ophthalmology/optometry visit for glaucoma screening and checkup Recommended yearly dental visit for hygiene and checkup  Vaccinations: Influenza vaccine: declined Pneumococcal vaccine: declined Tdap vaccine: declined Shingles vaccine: discussed    Advanced directives: Advance directive discussed with you today. Even though you declined this today please call our office should you change your mind and we can give you the proper paperwork for you to fill out.   Conditions/risks identified: none  Next appointment: 12/19/2070   Preventive Care 72 Years and Older, Female Preventive care refers to lifestyle choices and visits with your health care provider that can promote health and wellness. What does preventive care include?  A yearly physical exam. This is also called an annual well check.  Dental exams once or twice a year.  Routine eye exams. Ask your health care provider how often you should have your eyes checked.  Personal lifestyle choices, including:  Daily care of your teeth and gums.  Regular physical activity.  Eating a healthy diet.  Avoiding tobacco and drug use.  Limiting alcohol use.  Practicing safe sex.  Taking low-dose aspirin every day.  Taking vitamin and mineral supplements as recommended by your health care provider. What happens during an annual well check? The services and screenings done by your health care provider during your annual well check will depend on your age, overall health, lifestyle risk factors, and family history of disease. Counseling  Your health care provider may ask you  questions about your:  Alcohol use.  Tobacco use.  Drug use.  Emotional well-being.  Home and relationship well-being.  Sexual activity.  Eating habits.  History of falls.  Memory and ability to understand (cognition).  Work and work Statistician.  Reproductive health. Screening  You may have the following tests or measurements:  Height, weight, and BMI.  Blood pressure.  Lipid and cholesterol levels. These may be checked every 5 years, or more frequently if you are over 30 years old.  Skin check.  Lung cancer screening. You may have this screening every year starting at age 72 if you have a 30-pack-year history of smoking and currently smoke or have quit within the past 15 years.  Fecal occult blood test (FOBT) of the stool. You may have this test every year starting at age 72.  Flexible sigmoidoscopy or colonoscopy. You may have a sigmoidoscopy every 5 years or a colonoscopy every 10 years starting at age 72.  Hepatitis C blood test.  Hepatitis B blood test.  Sexually transmitted disease (STD) testing.  Diabetes screening. This is done by checking your blood sugar (glucose) after you have not eaten for a while (fasting). You may have this done every 1-3 years.  Bone density scan. This is done to screen for osteoporosis. You may have this done starting at age 72.  Mammogram. This may be done every 1-2 years. Talk to your health care provider about how often you should have regular mammograms. Talk with your health care provider about your test results, treatment options, and if necessary, the need for more tests. Vaccines  Your health care provider may recommend certain vaccines, such as:  Influenza vaccine. This is recommended every year.  Tetanus, diphtheria, and acellular pertussis (Tdap, Td) vaccine. You may need a Td booster every 10 years.  Zoster vaccine. You may need this after age 72.  Pneumococcal 13-valent conjugate (PCV13) vaccine. One dose is  recommended after age 72.  Pneumococcal polysaccharide (PPSV23) vaccine. One dose is recommended after age 72. Talk to your health care provider about which screenings and vaccines you need and how often you need them. This information is not intended to replace advice given to you by your health care provider. Make sure you discuss any questions you have with your health care provider. Document Released: 07/23/2015 Document Revised: 03/15/2016 Document Reviewed: 04/27/2015 Elsevier Interactive Patient Education  2017 [place] Prevention in the Home Falls can cause injuries. They can happen to people of all ages. There are many things you can do to make your home safe and to help prevent falls. What can I do on the outside of my home?  Regularly fix the edges of walkways and driveways and fix any cracks.  Remove anything that might make you trip as you walk through a door, such as a raised step or threshold.  Trim any bushes or trees on the path to your home.  Use bright outdoor lighting.  Clear any walking paths of anything that might make someone trip, such as rocks or tools.  Regularly check to see if handrails are loose or broken. Make sure that both sides of any steps have handrails.  Any raised decks and porches should have guardrails on the edges.  Have any leaves, snow, or ice cleared regularly.  Use sand or salt on walking paths during winter.  Clean up any spills in your garage right away. This includes oil or grease spills. What can I do in the bathroom?  Use night lights.  Install grab bars by the toilet and in the tub and shower. Do not use towel bars as grab bars.  Use non-skid mats or decals in the tub or shower.  If you need to sit down in the shower, use a plastic, non-slip stool.  Keep the floor dry. Clean up any water that spills on the floor as soon as it happens.  Remove soap buildup in the tub or shower regularly.  Attach bath mats  securely with double-sided non-slip rug tape.  Do not have throw rugs and other things on the floor that can make you trip. What can I do in the bedroom?  Use night lights.  Make sure that you have a light by your bed that is easy to reach.  Do not use any sheets or blankets that are too big for your bed. They should not hang down onto the floor.  Have a firm chair that has side arms. You can use this for support while you get dressed.  Do not have throw rugs and other things on the floor that can make you trip. What can I do in the kitchen?  Clean up any spills right away.  Avoid walking on wet floors.  Keep items that you use a lot in easy-to-reach places.  If you need to reach something above you, use a strong step stool that has a grab bar.  Keep electrical cords out of the way.  Do not use floor polish or wax that makes floors slippery. If you must use wax, use non-skid floor wax.  Do not have throw rugs and other things on the floor that can make you trip. What can I do  with my stairs?  Do not leave any items on the stairs.  Make sure that there are handrails on both sides of the stairs and use them. Fix handrails that are broken or loose. Make sure that handrails are as long as the stairways.  Check any carpeting to make sure that it is firmly attached to the stairs. Fix any carpet that is loose or worn.  Avoid having throw rugs at the top or bottom of the stairs. If you do have throw rugs, attach them to the floor with carpet tape.  Make sure that you have a light switch at the top of the stairs and the bottom of the stairs. If you do not have them, ask someone to add them for you. What else can I do to help prevent falls?  Wear shoes that:  Do not have high heels.  Have rubber bottoms.  Are comfortable and fit you well.  Are closed at the toe. Do not wear sandals.  If you use a stepladder:  Make sure that it is fully opened. Do not climb a closed  stepladder.  Make sure that both sides of the stepladder are locked into place.  Ask someone to hold it for you, if possible.  Clearly mark and make sure that you can see:  Any grab bars or handrails.  First and last steps.  Where the edge of each step is.  Use tools that help you move around (mobility aids) if they are needed. These include:  Canes.  Walkers.  Scooters.  Crutches.  Turn on the lights when you go into a dark area. Replace any light bulbs as soon as they burn out.  Set up your furniture so you have a clear path. Avoid moving your furniture around.  If any of your floors are uneven, fix them.  If there are any pets around you, be aware of where they are.  Review your medicines with your doctor. Some medicines can make you feel dizzy. This can increase your chance of falling. Ask your doctor what other things that you can do to help prevent falls. This information is not intended to replace advice given to you by your health care provider. Make sure you discuss any questions you have with your health care provider. Document Released: 04/22/2009 Document Revised: 12/02/2015 Document Reviewed: 07/31/2014 Elsevier Interactive Patient Education  2017 [employer].

## 2018-12-20 LAB — CBC
Hematocrit: 36.3 % (ref 34.0–46.6)
Hemoglobin: 12 g/dL (ref 11.1–15.9)
MCH: 29.3 pg (ref 26.6–33.0)
MCHC: 33.1 g/dL (ref 31.5–35.7)
MCV: 89 fL (ref 79–97)
Platelets: 353 10*3/uL (ref 150–450)
RBC: 4.1 x10E6/uL (ref 3.77–5.28)
RDW: 13.4 % (ref 11.7–15.4)
WBC: 3.5 10*3/uL (ref 3.4–10.8)

## 2018-12-20 LAB — CMP14 + ANION GAP
ALT: 15 IU/L (ref 0–32)
AST: 23 IU/L (ref 0–40)
Albumin/Globulin Ratio: 1.7 (ref 1.2–2.2)
Albumin: 4.7 g/dL (ref 3.7–4.7)
Alkaline Phosphatase: 52 IU/L (ref 39–117)
Anion Gap: 17 mmol/L (ref 10.0–18.0)
BUN/Creatinine Ratio: 10 — ABNORMAL LOW (ref 12–28)
BUN: 14 mg/dL (ref 8–27)
Bilirubin Total: 0.3 mg/dL (ref 0.0–1.2)
CO2: 24 mmol/L (ref 20–29)
Calcium: 10.9 mg/dL — ABNORMAL HIGH (ref 8.7–10.3)
Chloride: 101 mmol/L (ref 96–106)
Creatinine, Ser: 1.38 mg/dL — ABNORMAL HIGH (ref 0.57–1.00)
GFR calc Af Amer: 44 mL/min/{1.73_m2} — ABNORMAL LOW (ref >59)
GFR calc non Af Amer: 38 mL/min/{1.73_m2} — ABNORMAL LOW (ref >59)
Globulin, Total: 2.7 g/dL (ref 1.5–4.5)
Glucose: 97 mg/dL (ref 65–99)
Potassium: 3.8 mmol/L (ref 3.5–5.2)
Sodium: 142 mmol/L (ref 134–144)
Total Protein: 7.4 g/dL (ref 6.0–8.5)

## 2018-12-20 LAB — LIPID PANEL
Chol/HDL Ratio: 2.9 ratio (ref 0.0–4.4)
Cholesterol, Total: 151 mg/dL (ref 100–199)
HDL: 52 mg/dL (ref >39)
LDL Calculated: 77 mg/dL (ref 0–99)
Triglycerides: 108 mg/dL (ref 0–149)
VLDL Cholesterol Cal: 22 mg/dL (ref 5–40)

## 2018-12-22 ENCOUNTER — Encounter: Payer: Self-pay | Admitting: Nurse Practitioner

## 2018-12-23 ENCOUNTER — Other Ambulatory Visit: Payer: Self-pay | Admitting: Nurse Practitioner

## 2018-12-23 DIAGNOSIS — Z1159 Encounter for screening for other viral diseases: Secondary | ICD-10-CM

## 2018-12-24 LAB — HEPATITIS C ANTIBODY: Hep C Virus Ab: 0.1 s/co ratio (ref 0.0–0.9)

## 2019-01-23 ENCOUNTER — Other Ambulatory Visit: Payer: Self-pay | Admitting: Nurse Practitioner

## 2019-02-23 ENCOUNTER — Other Ambulatory Visit: Payer: Self-pay | Admitting: Nurse Practitioner

## 2019-05-22 ENCOUNTER — Other Ambulatory Visit: Payer: Self-pay | Admitting: Nurse Practitioner

## 2019-06-03 ENCOUNTER — Other Ambulatory Visit: Payer: Self-pay | Admitting: Nurse Practitioner

## 2019-06-03 DIAGNOSIS — I10 Essential (primary) hypertension: Secondary | ICD-10-CM

## 2019-06-06 ENCOUNTER — Other Ambulatory Visit: Payer: Self-pay | Admitting: Nurse Practitioner

## 2019-06-06 DIAGNOSIS — I10 Essential (primary) hypertension: Secondary | ICD-10-CM

## 2019-06-25 ENCOUNTER — Encounter: Payer: Self-pay | Admitting: Nurse Practitioner

## 2019-06-25 ENCOUNTER — Ambulatory Visit: Payer: Medicare Other | Admitting: Nurse Practitioner

## 2019-06-25 ENCOUNTER — Other Ambulatory Visit: Payer: Self-pay

## 2019-06-25 VITALS — BP 110/72 | HR 60 | Temp 98.9°F | Ht 65.0 in | Wt 131.2 lb

## 2019-06-25 DIAGNOSIS — M1990 Unspecified osteoarthritis, unspecified site: Secondary | ICD-10-CM | POA: Diagnosis not present

## 2019-06-25 DIAGNOSIS — Z1211 Encounter for screening for malignant neoplasm of colon: Secondary | ICD-10-CM

## 2019-06-25 DIAGNOSIS — E782 Mixed hyperlipidemia: Secondary | ICD-10-CM

## 2019-06-25 DIAGNOSIS — I10 Essential (primary) hypertension: Secondary | ICD-10-CM

## 2019-06-25 DIAGNOSIS — E0590 Thyrotoxicosis, unspecified without thyrotoxic crisis or storm: Secondary | ICD-10-CM

## 2019-06-25 NOTE — Progress Notes (Signed)
Subjective:     Patient ID: Priscilla Thornton , female    DOB: 15-Nov-1945 , 73 y.o.   MRN: [id number]   Chief Complaint  Patient presents with  . Hypertension    HPI  Hypertension This is a chronic problem. The current episode started more than 1 year ago. The problem is unchanged. The problem is controlled. Pertinent negatives include no chest pain, headaches, palpitations or shortness of breath. There are no associated agents to hypertension. There are no known risk factors for coronary artery disease. Past treatments include angiotensin blockers. The current treatment provides moderate improvement. There are no compliance problems.  There is no history of angina, kidney disease or CVA. Identifiable causes of hypertension include a thyroid problem. There is no history of chronic renal disease.     Past Medical History:  Diagnosis Date  . Arthritis   . Hyperlipidemia   . Hypertension      Family History  Problem Relation Age of Onset  . Diabetes Mother   . Heart disease Father   . Colon cancer Neg Hx   . Esophageal cancer Neg Hx   . Rectal cancer Neg Hx   . Stomach cancer Neg Hx      Current Outpatient Medications:  .  aspirin 81 MG tablet, Take 81 mg by mouth daily., Disp: , Rfl:  .  atorvastatin (LIPITOR) 20 MG tablet, TAKE 1 TABLET BY MOUTH EVERY DAY, Disp: 90 tablet, Rfl: 1 .  fish oil-omega-3 fatty acids 1000 MG capsule, Take 1 g by mouth daily., Disp: , Rfl:  .  folic acid (FOLVITE) 1 MG tablet, Take 1 mg by mouth daily., Disp: , Rfl:  .  hydrochlorothiazide (HYDRODIURIL) 12.5 MG tablet, TAKE 1 TABLET(12.5 MG) BY MOUTH DAILY, Disp: 90 tablet, Rfl: 1 .  ibuprofen (ADVIL,MOTRIN) 200 MG tablet, Take 200 mg by mouth every 6 (six) hours as needed., Disp: , Rfl:  .  losartan (COZAAR) 100 MG tablet, TAKE 1 TABLET(100 MG) BY MOUTH DAILY, Disp: 90 tablet, Rfl: 1 .  methimazole (TAPAZOLE) 5 MG tablet, Take 5 mg by mouth daily., Disp: , Rfl:  .  methotrexate (50 MG/ML) 1 g  injection, Inject 1 mg into the vein once a week., Disp: , Rfl:  .  Multiple Vitamins-Calcium (ONE-A-DAY WOMENS FORMULA PO), Take 1 tablet by mouth daily., Disp: , Rfl:  .  potassium chloride (KLOR-CON) 10 MEQ tablet, TAKE 1 TABLET BY MOUTH EVERY DAY WITH FOOD, Disp: 90 tablet, Rfl: 0   No Known Allergies   Review of Systems  Constitutional: Negative.  Negative for fatigue.  Eyes: Negative for visual disturbance.  Respiratory: Negative.  Negative for shortness of breath.   Cardiovascular: Negative.  Negative for chest pain, palpitations and leg swelling.  Gastrointestinal: Negative.   Endocrine: Negative.   Musculoskeletal: Negative.   Skin: Negative.   Neurological: Negative for dizziness, weakness and headaches.  Psychiatric/Behavioral: Negative for confusion. The patient is not nervous/anxious.      Today's Vitals   06/25/19 0901  BP: 110/72  Pulse: 60  Temp: 98.9 F (37.2 C)  TempSrc: Oral  Weight: 131 lb 3.2 oz (59.5 kg)  Height: 5\' 5"  (1.651 m)  PainSc: 0-No pain   Body mass index is 21.83 kg/m.   Objective:  Physical Exam Vitals reviewed.  Constitutional:      Appearance: She is well-developed.  Eyes:     Pupils: Pupils are equal, round, and reactive to light.  Cardiovascular:     Rate and  Rhythm: Normal rate and regular rhythm.     Heart sounds: Normal heart sounds. No murmur.  Pulmonary:     Effort: Pulmonary effort is normal.     Breath sounds: Normal breath sounds.  Musculoskeletal:        General: Normal range of motion.  Skin:    General: Skin is warm and dry.     Capillary Refill: Capillary refill takes less than 2 seconds.  Neurological:     Mental Status: She is alert and oriented to person, place, and time.     Cranial Nerves: No cranial nerve deficit.         Assessment And Plan:     1. Essential hypertension  Chronic, well controlled  Continue with current medications  2. Hyperthyroidism  Chronic, seen by Dr Chalmers Cater a week  ago  Will get records from that office visit  Continue with your methimazole daily  3. Arthritis  Managed by Dr Noemi Chapel, seen last week overall doing well.   Continue methotrexate  5. Encounter for screening colonoscopy  According to USPTF Colorectal cancer Screening guidelines. Colonoscopy is recommended every 10 years, starting at age 89years.  Ordered cologuard, aware if positive will need a colonoscopy for colon cancer screening. - Cologuard       [name], FNP

## 2019-07-15 ENCOUNTER — Telehealth: Payer: Self-pay

## 2019-07-15 NOTE — Telephone Encounter (Signed)
Returned call to pt.

## 2019-07-15 NOTE — Telephone Encounter (Signed)
I left pt v/m to call the office I was calling to see if she completed her cologuard kit. YRL,RMA

## 2019-07-18 ENCOUNTER — Other Ambulatory Visit: Payer: Self-pay | Admitting: Nurse Practitioner

## 2019-08-18 ENCOUNTER — Other Ambulatory Visit: Payer: Self-pay | Admitting: Nurse Practitioner

## 2019-09-01 ENCOUNTER — Telehealth: Payer: Self-pay

## 2019-09-01 NOTE — Telephone Encounter (Signed)
I called patient to see if she is going to do her cologuard if not she is to send back the supplies and we will send her for a colonoscopy. Left pt v/m to call the office [facility]

## 2019-09-23 ENCOUNTER — Ambulatory Visit: Payer: Medicare Other | Admitting: Nurse Practitioner

## 2019-09-25 ENCOUNTER — Ambulatory Visit: Payer: Medicare Other | Attending: Family

## 2019-09-25 DIAGNOSIS — Z23 Encounter for immunization: Secondary | ICD-10-CM

## 2019-09-25 NOTE — Progress Notes (Signed)
   Covid-19 Vaccination Clinic  Name:  Priscilla Thornton    MRN: [id number] DOB: 08-03-1945  09/25/2019  [name] was observed post Covid-19 immunization for 15 minutes without incident. She was provided with Vaccine Information Sheet and instruction to access the V-Safe system.   [name] was instructed to call 911 with any severe reactions post vaccine: Marland Kitchen Difficulty breathing  . Swelling of face and throat  . A fast heartbeat  . A bad rash all over body  . Dizziness and weakness   Immunizations Administered    Name Date Dose VIS Date Route   Moderna COVID-19 Vaccine 09/25/2019  1:14 PM 0.5 mL 06/10/2019 Intramuscular   Manufacturer: Moderna   Lot: [id number]   [id number]

## 2019-10-16 ENCOUNTER — Other Ambulatory Visit: Payer: Self-pay | Admitting: Nurse Practitioner

## 2019-10-16 DIAGNOSIS — I10 Essential (primary) hypertension: Secondary | ICD-10-CM

## 2019-10-22 ENCOUNTER — Telehealth: Payer: Self-pay

## 2019-10-22 NOTE — Telephone Encounter (Signed)
Patient's cologuard has not been completed so I have called pt and left pt v/m to call the office [facility]

## 2019-10-28 ENCOUNTER — Ambulatory Visit: Payer: Medicare Other | Attending: Family

## 2019-10-28 DIAGNOSIS — Z23 Encounter for immunization: Secondary | ICD-10-CM

## 2019-10-28 NOTE — Progress Notes (Signed)
   Covid-19 Vaccination Clinic  Name:  Priscilla Thornton    MRN: [id number] DOB: 03/05/1946  10/28/2019  [name] was observed post Covid-19 immunization for 15 minutes without incident. She was provided with Vaccine Information Sheet and instruction to access the V-Safe system.   [name] was instructed to call 911 with any severe reactions post vaccine: Marland Kitchen Difficulty breathing  . Swelling of face and throat  . A fast heartbeat  . A bad rash all over body  . Dizziness and weakness   Immunizations Administered    Name Date Dose VIS Date Route   Moderna COVID-19 Vaccine 10/28/2019 11:25 AM 0.5 mL 06/2019 Intramuscular   Manufacturer: Moderna   Lot: [id number]   [place]BE:[id number]

## 2019-11-17 ENCOUNTER — Telehealth: Payer: Self-pay

## 2019-11-17 NOTE — Telephone Encounter (Signed)
I called pt to see if she has completed her cologuard. I left pt v/m to call the office [facility]

## 2019-11-21 ENCOUNTER — Other Ambulatory Visit: Payer: Self-pay | Admitting: Nurse Practitioner

## 2019-12-03 ENCOUNTER — Other Ambulatory Visit: Payer: Self-pay | Admitting: Nurse Practitioner

## 2019-12-03 DIAGNOSIS — I10 Essential (primary) hypertension: Secondary | ICD-10-CM

## 2019-12-31 ENCOUNTER — Ambulatory Visit (INDEPENDENT_AMBULATORY_CARE_PROVIDER_SITE_OTHER): Payer: Medicare PPO | Admitting: Nurse Practitioner

## 2019-12-31 ENCOUNTER — Ambulatory Visit (INDEPENDENT_AMBULATORY_CARE_PROVIDER_SITE_OTHER): Payer: Medicare PPO

## 2019-12-31 ENCOUNTER — Encounter: Payer: Self-pay | Admitting: Nurse Practitioner

## 2019-12-31 ENCOUNTER — Other Ambulatory Visit: Payer: Self-pay

## 2019-12-31 VITALS — BP 128/62 | HR 70 | Temp 97.8°F | Ht 64.2 in | Wt 136.0 lb

## 2019-12-31 VITALS — BP 128/62 | HR 70 | Temp 97.8°F | Ht 64.2 in | Wt 136.6 lb

## 2019-12-31 DIAGNOSIS — Z0000 Encounter for general adult medical examination without abnormal findings: Secondary | ICD-10-CM

## 2019-12-31 DIAGNOSIS — D179 Benign lipomatous neoplasm, unspecified: Secondary | ICD-10-CM

## 2019-12-31 DIAGNOSIS — I10 Essential (primary) hypertension: Secondary | ICD-10-CM | POA: Diagnosis not present

## 2019-12-31 DIAGNOSIS — E0590 Thyrotoxicosis, unspecified without thyrotoxic crisis or storm: Secondary | ICD-10-CM | POA: Diagnosis not present

## 2019-12-31 DIAGNOSIS — E782 Mixed hyperlipidemia: Secondary | ICD-10-CM

## 2019-12-31 DIAGNOSIS — M1990 Unspecified osteoarthritis, unspecified site: Secondary | ICD-10-CM

## 2019-12-31 LAB — POCT URINALYSIS DIPSTICK
Bilirubin, UA: NEGATIVE
Blood, UA: NEGATIVE
Glucose, UA: NEGATIVE
Ketones, UA: NEGATIVE
Nitrite, UA: NEGATIVE
Protein, UA: POSITIVE — AB
Spec Grav, UA: 1.01 (ref 1.010–1.025)
Urobilinogen, UA: 0.2 E.U./dL
pH, UA: 6.5 (ref 5.0–8.0)

## 2019-12-31 LAB — POCT UA - MICROALBUMIN
Albumin/Creatinine Ratio, Urine, POC: <30
Creatinine, POC: 300 mg/dL
Microalbumin Ur, POC: 30 mg/L

## 2019-12-31 MED ORDER — LOSARTAN POTASSIUM 100 MG PO TABS: 100.0000 mg | ORAL_TABLET | Freq: Every day | ORAL | 1 refills | Status: DC

## 2019-12-31 MED ORDER — ATORVASTATIN CALCIUM 20 MG PO TABS
20.0000 mg | ORAL_TABLET | Freq: Every day | ORAL | 1 refills | Status: DC
Start: 2019-12-31 — End: 2020-06-24

## 2019-12-31 MED ORDER — HYDROCHLOROTHIAZIDE 12.5 MG PO TABS: 12.5000 mg | ORAL_TABLET | Freq: Every day | ORAL | 0 refills | Status: DC

## 2019-12-31 MED ORDER — POTASSIUM CHLORIDE CRYS ER 10 MEQ PO TBCR: 10.0000 meq | EXTENDED_RELEASE_TABLET | Freq: Every day | ORAL | 1 refills | Status: DC

## 2019-12-31 NOTE — Progress Notes (Signed)
Subjective:     Patient ID: Priscilla Thornton , female    DOB: Aug 21, 1945 , 74 y.o.   MRN: [id number]   Chief Complaint  Patient presents with  . Annual Exam   The patient states she uses post menopausal status for birth control. Last LMP was No LMP recorded. Patient is postmenopausal.. Negative for Dysmenorrhea and Negative for Menorrhagia Mammogram last done 2014 she chooses not to do them. Negative for: breast discharge, breast lump(s), breast pain and breast self exam.  Pertinent negatives include abnormal bleeding (hematology), anxiety, decreased libido, depression, difficulty falling sleep, dyspareunia, history of infertility, nocturia, sexual dysfunction, sleep disturbances, urinary incontinence, urinary urgency, vaginal discharge and vaginal itching. Diet regular.The patient states her exercise level is      The patient's tobacco use is:    Social History   Tobacco Use  Smoking Status Former Smoker  . Packs/day: 0.20  . Types: Cigarettes  . Quit date: 2016  . Years since quitting: 5.4  Smokeless Tobacco Never Used  Tobacco Comment   2-3 cigaretts a day   She has been exposed to passive smoke. The patient's alcohol use is:  Social History   Substance and Sexual Activity  Alcohol Use No   Additional information: she is no longer having PAPs by her choice.  HPI  Hyperlipidemia - she is doing well with her cholesterol  Dr. Candise Bowens - seen last month  Dr. Chalmers Cater - seen June 9th.    Dr. Gershon Crane - eye - seen June 10th.  Wt Readings from Last 3 Encounters: 12/31/19 : 136 lb (61.7 kg) 12/31/19 : 136 lb 9.6 oz (62 kg) 06/25/19 : 131 lb 3.2 oz (59.5 kg)   Hypertension This is a chronic problem. The current episode started more than 1 year ago. The problem is controlled. Pertinent negatives include no anxiety, chest pain, headaches, palpitations or shortness of breath. Risk factors for coronary artery disease include dyslipidemia and sedentary lifestyle. There are no  compliance problems.  There is no history of kidney disease. There is no history of chronic renal disease.     Past Medical History:  Diagnosis Date  . Arthritis   . Hyperlipidemia   . Hypertension      Family History  Problem Relation Age of Onset  . Diabetes Mother   . Heart disease Father   . Colon cancer Neg Hx   . Esophageal cancer Neg Hx   . Rectal cancer Neg Hx   . Stomach cancer Neg Hx      Current Outpatient Medications:  .  aspirin 81 MG tablet, Take 81 mg by mouth daily., Disp: , Rfl:  .  atorvastatin (LIPITOR) 20 MG tablet, TAKE 1 TABLET BY MOUTH EVERY DAY, Disp: 90 tablet, Rfl: 1 .  fish oil-omega-3 fatty acids 1000 MG capsule, Take 1 g by mouth daily., Disp: , Rfl:  .  folic acid (FOLVITE) 1 MG tablet, Take 1 mg by mouth daily., Disp: , Rfl:  .  hydrochlorothiazide (HYDRODIURIL) 12.5 MG tablet, TAKE 1 TABLET(12.5 MG) BY MOUTH DAILY, Disp: 90 tablet, Rfl: 0 .  ibuprofen (ADVIL,MOTRIN) 200 MG tablet, Take 200 mg by mouth every 6 (six) hours as needed., Disp: , Rfl:  .  losartan (COZAAR) 100 MG tablet, TAKE 1 TABLET(100 MG) BY MOUTH DAILY, Disp: 90 tablet, Rfl: 1 .  methimazole (TAPAZOLE) 5 MG tablet, Take 5 mg by mouth daily., Disp: , Rfl:  .  methotrexate (50 MG/ML) 1 g injection, Inject 1 mg into the  vein once a week., Disp: , Rfl:  .  Multiple Vitamins-Calcium (ONE-A-DAY WOMENS FORMULA PO), Take 1 tablet by mouth daily., Disp: , Rfl:  .  potassium chloride (KLOR-CON) 10 MEQ tablet, TAKE 1 TABLET BY MOUTH EVERY DAY WITH FOOD, Disp: 90 tablet, Rfl: 0   No Known Allergies   Review of Systems  Constitutional: Negative.   HENT: Negative.   Eyes: Negative.   Respiratory: Negative.  Negative for cough and shortness of breath.   Cardiovascular: Negative.  Negative for chest pain, palpitations and leg swelling.  Gastrointestinal: Negative.   Endocrine: Negative.   Genitourinary: Negative.   Musculoskeletal: Negative.   Skin: Negative.   Allergic/Immunologic:  Negative.   Neurological: Negative.  Negative for dizziness and headaches.  Hematological: Negative.   Psychiatric/Behavioral: Negative.      Today's Vitals   12/31/19 0937  BP: 128/62  Pulse: 70  Temp: 97.8 F (36.6 C)  TempSrc: Oral  SpO2: 97%  Weight: 136 lb (61.7 kg)  Height: 5' 4.2" (1.631 m)   Body mass index is 23.2 kg/m.   Objective:  Physical Exam Vitals reviewed.  Constitutional:      Appearance: Normal appearance. She is well-developed.  HENT:     Head: Normocephalic and atraumatic.     Right Ear: Hearing, tympanic membrane, ear canal and external ear normal.     Left Ear: Hearing, tympanic membrane, ear canal and external ear normal.     Nose: Nose normal.     Mouth/Throat:     Mouth: Mucous membranes are moist.  Eyes:     General: Lids are normal.     Conjunctiva/sclera: Conjunctivae normal.     Pupils: Pupils are equal, round, and reactive to light.     Funduscopic exam:    Right eye: No papilledema.        Left eye: No papilledema.  Neck:     Thyroid: No thyroid mass.     Vascular: No carotid bruit.  Cardiovascular:     Rate and Rhythm: Normal rate and regular rhythm.     Pulses: Normal pulses.     Heart sounds: Normal heart sounds. No murmur heard.   Pulmonary:     Effort: Pulmonary effort is normal.     Breath sounds: Normal breath sounds.  Abdominal:     General: Abdomen is flat. Bowel sounds are normal.     Palpations: Abdomen is soft.  Musculoskeletal:        General: No swelling. Normal range of motion.     Cervical back: Full passive range of motion without pain, normal range of motion and neck supple.     Right lower leg: No edema.     Left lower leg: No edema.  Skin:    General: Skin is warm and dry.     Capillary Refill: Capillary refill takes less than 2 seconds.     Findings: No lesion.     Comments: Large soft tissue lump to right upper back  Neurological:     General: No focal deficit present.     Mental Status: She is  alert and oriented to person, place, and time.     Cranial Nerves: No cranial nerve deficit.     Sensory: No sensory deficit.  Psychiatric:        Mood and Affect: Mood normal.        Behavior: Behavior normal.        Thought Content: Thought content normal.  Judgment: Judgment normal.         Assessment And Plan:    1. Health maintenance examination . Behavior modifications discussed and diet history reviewed.   . Pt will continue to exercise regularly and modify diet with low GI, plant based foods and decrease intake of processed foods.  . Recommend intake of daily multivitamin, Vitamin D, and calcium.  . Recommend for preventive screenings, as well as recommend immunizations that include TDAP (up to date)  2. Essential hypertension  Chronic, excellent control  Continue current medications  EKG done with occasional ectopic ventricular beat, right atrial enlargement - POCT Urinalysis Dipstick (81002) - POCT UA - Microalbumin - EKG 12-Lead - CMP14+EGFR - hydrochlorothiazide (HYDRODIURIL) 12.5 MG tablet; Take 1 tablet (12.5 mg total) by mouth daily.  Dispense: 90 tablet; Refill: 0 - losartan (COZAAR) 100 MG tablet; Take 1 tablet (100 mg total) by mouth daily.  Dispense: 90 tablet; Refill: 1 - potassium chloride (KLOR-CON) 10 MEQ tablet; Take 1 tablet (10 mEq total) by mouth daily. with food  Dispense: 90 tablet; Refill: 1  3. Mixed hyperlipidemia  Chronic, stable  Continue with medications - Lipid panel - CBC no Diff  4. Hyperthyroidism  Chronic, continue follow up with Dr. Chalmers Cater  5. Arthritis  Chronic, being followed by Dr. Carney Harder  6. Lipoma, unspecified site  Chronic, lipoma is actually on the right upper back   no changes or concerns           [name], FNP    THE PATIENT IS ENCOURAGED TO PRACTICE SOCIAL DISTANCING DUE TO THE COVID-19 PANDEMIC.

## 2019-12-31 NOTE — Progress Notes (Signed)
This visit occurred during the SARS-CoV-2 public health emergency.  Safety protocols were in place, including screening questions prior to the visit, additional usage of staff PPE, and extensive cleaning of exam room while observing appropriate contact time as indicated for disinfecting solutions.  Subjective:   Priscilla Thornton is a 74 y.o. female who presents for Medicare Annual (Subsequent) preventive examination.  Review of Systems    n/a Cardiac Risk Factors include: advanced age (>57men, >65 women);hypertension;sedentary lifestyle     Objective:    Today's Vitals   12/31/19 0836  BP: 128/62  Pulse: 70  Temp: 97.8 F (36.6 C)  TempSrc: Oral  SpO2: 97%  Weight: 136 lb 9.6 oz (62 kg)  Height: 5' 4.2" (1.631 m)   Body mass index is 23.3 kg/m.  Advanced Directives 12/31/2019 12/19/2018  Does Patient Have a Medical Advance Directive? No No  Would patient like information on creating a medical advance directive? No - Patient declined No - Patient declined    Current Medications (verified) Outpatient Encounter Medications as of 12/31/2019  Medication Sig  . aspirin 81 MG tablet Take 81 mg by mouth daily.  . fish oil-omega-3 fatty acids 1000 MG capsule Take 1 g by mouth daily.  . folic acid (FOLVITE) 1 MG tablet Take 1 mg by mouth daily.  Marland Kitchen ibuprofen (ADVIL,MOTRIN) 200 MG tablet Take 200 mg by mouth every 6 (six) hours as needed.  . methimazole (TAPAZOLE) 5 MG tablet Take 5 mg by mouth daily.  . methotrexate (50 MG/ML) 1 g injection Inject 1 mg into the vein once a week.  . Multiple Vitamins-Calcium (ONE-A-DAY WOMENS FORMULA PO) Take 1 tablet by mouth daily.  . [DISCONTINUED] atorvastatin (LIPITOR) 20 MG tablet TAKE 1 TABLET BY MOUTH EVERY DAY  . [DISCONTINUED] hydrochlorothiazide (HYDRODIURIL) 12.5 MG tablet TAKE 1 TABLET(12.5 MG) BY MOUTH DAILY  . [DISCONTINUED] losartan (COZAAR) 100 MG tablet TAKE 1 TABLET(100 MG) BY MOUTH DAILY  . [DISCONTINUED] potassium chloride  (KLOR-CON) 10 MEQ tablet TAKE 1 TABLET BY MOUTH EVERY DAY WITH FOOD   No facility-administered encounter medications on file as of 12/31/2019.    Allergies (verified) Patient has no known allergies.   History: Past Medical History:  Diagnosis Date  . Arthritis   . Hyperlipidemia   . Hypertension    History reviewed. No pertinent surgical history. Family History  Problem Relation Age of Onset  . Diabetes Mother   . Heart disease Father   . Colon cancer Neg Hx   . Esophageal cancer Neg Hx   . Rectal cancer Neg Hx   . Stomach cancer Neg Hx    Social History   Socioeconomic History  . Marital status: Single    Spouse name: Not on file  . Number of children: Not on file  . Years of education: Not on file  . Highest education level: Not on file  Occupational History  . Not on file  Tobacco Use  . Smoking status: Former Smoker    Packs/day: 0.20    Types: Cigarettes    Quit date: 2016    Years since quitting: 5.4  . Smokeless tobacco: Never Used  . Tobacco comment: 2-3 cigaretts a day  Vaping Use  . Vaping Use: Never used  Substance and Sexual Activity  . Alcohol use: No  . Drug use: No  . Sexual activity: Not Currently  Other Topics Concern  . Not on file  Social History Narrative  . Not on file   Social Determinants of  Health   Financial Resource Strain: Low Risk   . Difficulty of Paying Living Expenses: Not hard at all  Food Insecurity: No Food Insecurity  . Worried About Charity fundraiser in the Last Year: Never true  . Ran Out of Food in the Last Year: Never true  Transportation Needs: No Transportation Needs  . Lack of Transportation (Medical): No  . Lack of Transportation (Non-Medical): No  Physical Activity: Inactive  . Days of Exercise per Week: 0 days  . Minutes of Exercise per Session: 0 min  Stress: No Stress Concern Present  . Feeling of Stress : Not at all  Social Connections:   . Frequency of Communication with Friends and Family:   .  Frequency of Social Gatherings with Friends and Family:   . Attends Religious Services:   . Active Member of Clubs or Organizations:   . Attends Archivist Meetings:   Marland Kitchen Marital Status:     Tobacco Counseling Counseling given: Not Answered Comment: 2-3 cigaretts a day   Clinical Intake:  Pre-visit preparation completed: Yes  Pain : No/denies pain     Nutritional Status: BMI of 19-24  Normal Nutritional Risks: None Diabetes: No  How often do you need to have someone help you when you read instructions, pamphlets, or other written materials from your doctor or pharmacy?: 1 - Never What is the last grade level you completed in school?: some college  Diabetic?no  Interpreter Needed?: No  Information entered by :: NAllen LPN   Activities of Daily Living In your present state of health, do you have any difficulty performing the following activities: 12/31/2019  Hearing? N  Vision? N  Difficulty concentrating or making decisions? N  Walking or climbing stairs? N  Dressing or bathing? N  Doing errands, shopping? N  Preparing Food and eating ? N  Using the Toilet? N  In the past six months, have you accidently leaked urine? N  Do you have problems with loss of bowel control? N  Managing your Medications? N  Managing your Finances? N  Housekeeping or managing your Housekeeping? N  Some recent data might be hidden    Patient Care Team: [name], FNP as PCP - General (General Practice)  Indicate any recent Medical Services you may have received from other than [facility] providers in the past year (date may be approximate).     Assessment:   This is a routine wellness examination for [place].  Hearing/Vision screen  Hearing Screening   125Hz  250Hz  500Hz  1000Hz  2000Hz  3000Hz  4000Hz  6000Hz  8000Hz   Right ear:           Left ear:           Vision Screening Comments: Regular eye exams. Dr. Gershon Crane  Dietary issues and exercise activities discussed: Current  Exercise Habits: The patient does not participate in regular exercise at present  Goals    . Patient Stated     Stay healthy    . Patient Stated     12/31/2019, to stay safe      Depression Screen PHQ 2/9 Scores 12/31/2019 06/25/2019 12/19/2018 06/03/2018  PHQ - 2 Score 0 0 0 0  PHQ- 9 Score 0 - 1 -    Fall Risk Fall Risk  12/31/2019 06/25/2019 12/19/2018 06/03/2018  Falls in the past year? 0 0 0 0  Risk for fall due to : Medication side effect - Medication side effect -  Follow up Falls evaluation completed;Education provided;Falls prevention discussed -  Falls prevention discussed;Education provided -    Any stairs in or around the home? No  If so, are there any without handrails? n/a Home free of loose throw rugs in walkways, pet beds, electrical cords, etc? Yes  Adequate lighting in your home to reduce risk of falls? Yes   ASSISTIVE DEVICES UTILIZED TO PREVENT FALLS:  Life alert? No  Use of a cane, walker or w/c? Yes  Grab bars in the bathroom? No  Shower chair or bench in shower? No  Elevated toilet seat or a handicapped toilet? No   TIMED UP AND GO:  Was the test performed? No .  .   Gait steady and fast without use of assistive device  Cognitive Function:     6CIT Screen 12/31/2019 12/19/2018  What Year? 0 points 0 points  What month? 0 points 0 points  What time? 0 points 0 points  Count back from 20 0 points 0 points  Months in reverse 2 points 4 points  Repeat phrase 4 points 2 points  Total Score 6 6    Immunizations Immunization History  Administered Date(s) Administered  . Moderna SARS-COVID-2 Vaccination 09/25/2019, 10/28/2019    TDAP status: Due, Education has been provided regarding the importance of this vaccine. Advised may receive this vaccine at local pharmacy or Health Dept. Aware to provide a copy of the vaccination record if obtained from local pharmacy or Health Dept. Verbalized acceptance and understanding. Flu Vaccine status: Declined,  Education has been provided regarding the importance of this vaccine but patient still declined. Advised may receive this vaccine at local pharmacy or Health Dept. Aware to provide a copy of the vaccination record if obtained from local pharmacy or Health Dept. Verbalized acceptance and understanding. Pneumococcal vaccine status: Declined,  Education has been provided regarding the importance of this vaccine but patient still declined. Advised may receive this vaccine at local pharmacy or Health Dept. Aware to provide a copy of the vaccination record if obtained from local pharmacy or Health Dept. Verbalized acceptance and understanding.  Covid-19 vaccine status: Completed vaccines  Qualifies for Shingles Vaccine? Yes   Zostavax completed No   Shingrix Completed?: No.    Education has been provided regarding the importance of this vaccine. Patient has been advised to call insurance company to determine out of pocket expense if they have not yet received this vaccine. Advised may also receive vaccine at local pharmacy or Health Dept. Verbalized acceptance and understanding.  Screening Tests Health Maintenance  Topic Date Due  . MAMMOGRAM  12/30/2020 (Originally 08/12/2014)  . COLONOSCOPY  12/30/2020 (Originally 07/16/2017)  . TETANUS/TDAP  12/30/2020 (Originally 09/18/1965)  . PNA vac Low Risk Adult (1 of 2 - PCV13) 12/30/2020 (Originally 09/19/2011)  . INFLUENZA VACCINE  02/08/2020  . DEXA SCAN  Completed  . COVID-19 Vaccine  Completed  . Hepatitis C Screening  Completed    Health Maintenance  There are no preventive care reminders to display for this patient.  Colorectal cancer screening: Completed 07/16/2012. Declines repeat Mammogram status: Patient declines Bone Density status: Completed 03/25/2018.   Lung Cancer Screening: (Low Dose CT Chest recommended if Age 75-80 years, 30 pack-year currently smoking OR have quit w/in 15years.) does not qualify.   Lung Cancer Screening Referral:  no  Additional Screening:  Hepatitis C Screening: does qualify; Completed 12/23/2018  Vision Screening: Recommended annual ophthalmology exams for early detection of glaucoma and other disorders of the eye. Is the patient up to date with their annual eye exam?  Yes  Who is the provider or what is the name of the office in which the patient attends annual eye exams? Dr. Gershon Crane If pt is not established with a provider, would they like to be referred to a provider to establish care? No .   Dental Screening: Recommended annual dental exams for proper oral hygiene  Community Resource Referral / Chronic Care Management: CRR required this visit?  No   CCM required this visit?  No      Plan:     I have personally reviewed and noted the following in the patient's chart:   . Medical and social history . Use of alcohol, tobacco or illicit drugs  . Current medications and supplements . Functional ability and status . Nutritional status . Physical activity . Advanced directives . List of other physicians . Hospitalizations, surgeries, and ER visits in previous 12 months . Vitals . Screenings to include cognitive, depression, and falls . Referrals and appointments  In addition, I have reviewed and discussed with patient certain preventive protocols, quality metrics, and best practice recommendations. A written personalized care plan for preventive services as well as general preventive health recommendations were provided to patient.     Kellie Simmering, LPN   08/17/221   Nurse Notes: Patient declines preventive testing. States that if she has something she just does.

## 2019-12-31 NOTE — Patient Instructions (Signed)
Health Maintenance After Age 73 After age 73, you are at a higher risk for certain long-term diseases and infections as well as injuries from falls. Falls are a major cause of broken bones and head injuries in people who are older than age 73. Getting regular preventive care can help to keep you healthy and well. Preventive care includes getting regular testing and making lifestyle changes as recommended by your health care provider. Talk with your health care provider about:  Which screenings and tests you should have. A screening is a test that checks for a disease when you have no symptoms.  A diet and exercise plan that is right for you. What should I know about screenings and tests to prevent falls? Screening and testing are the best ways to find a health problem early. Early diagnosis and treatment give you the best chance of managing medical conditions that are common after age 73. Certain conditions and lifestyle choices may make you more likely to have a fall. Your health care provider may recommend:  Regular vision checks. Poor vision and conditions such as cataracts can make you more likely to have a fall. If you wear glasses, make sure to get your prescription updated if your vision changes.  Medicine review. Work with your health care provider to regularly review all of the medicines you are taking, including over-the-counter medicines. Ask your health care provider about any side effects that may make you more likely to have a fall. Tell your health care provider if any medicines that you take make you feel dizzy or sleepy.  Osteoporosis screening. Osteoporosis is a condition that causes the bones to get weaker. This can make the bones weak and cause them to break more easily.  Blood pressure screening. Blood pressure changes and medicines to control blood pressure can make you feel dizzy.  Strength and balance checks. Your health care provider may recommend certain tests to check your  strength and balance while standing, walking, or changing positions.  Foot health exam. Foot pain and numbness, as well as not wearing proper footwear, can make you more likely to have a fall.  Depression screening. You may be more likely to have a fall if you have a fear of falling, feel emotionally low, or feel unable to do activities that you used to do.  Alcohol use screening. Using too much alcohol can affect your balance and may make you more likely to have a fall. What actions can I take to lower my risk of falls? General instructions  Talk with your health care provider about your risks for falling. Tell your health care provider if: ? You fall. Be sure to tell your health care provider about all falls, even ones that seem minor. ? You feel dizzy, sleepy, or off-balance.  Take over-the-counter and prescription medicines only as told by your health care provider. These include any supplements.  Eat a healthy diet and maintain a healthy weight. A healthy diet includes low-fat dairy products, low-fat (lean) meats, and fiber from whole grains, beans, and lots of fruits and vegetables. Home safety  Remove any tripping hazards, such as rugs, cords, and clutter.  Install safety equipment such as grab bars in bathrooms and safety rails on stairs.  Keep rooms and walkways well-lit. Activity   Follow a regular exercise program to stay fit. This will help you maintain your balance. Ask your health care provider what types of exercise are appropriate for you.  If you need a cane or   walker, use it as recommended by your health care provider.  Wear supportive shoes that have nonskid soles. Lifestyle  Do not drink alcohol if your health care provider tells you not to drink.  If you drink alcohol, limit how much you have: ? 0-1 drink a day for women. ? 0-2 drinks a day for men.  Be aware of how much alcohol is in your drink. In the [place] one drink equals one typical bottle of beer (12  oz), one-half glass of wine (5 oz), or one shot of hard liquor (1 oz).  Do not use any products that contain nicotine or tobacco, such as cigarettes and e-cigarettes. If you need help quitting, ask your health care provider. Summary  Having a healthy lifestyle and getting preventive care can help to protect your health and wellness after age 73.  Screening and testing are the best way to find a health problem early and help you avoid having a fall. Early diagnosis and treatment give you the best chance for managing medical conditions that are more common for people who are older than age 73.  Falls are a major cause of broken bones and head injuries in people who are older than age 73. Take precautions to prevent a fall at home.  Work with your health care provider to learn what changes you can make to improve your health and wellness and to prevent falls. This information is not intended to replace advice given to you by your health care provider. Make sure you discuss any questions you have with your health care provider. Document Revised: 10/17/2018 Document Reviewed: 05/09/2017 Elsevier Patient Education  2020 [employer]  

## 2019-12-31 NOTE — Patient Instructions (Signed)
Priscilla Thornton , Thank you for taking time to come for your Medicare Wellness Visit. I appreciate your ongoing commitment to your health goals. Please review the following plan we discussed and let me know if I can assist you in the future.   Screening recommendations/referrals: Colonoscopy: 1/7/2014decline Mammogram: declines Bone Density: 03/2018 Recommended yearly ophthalmology/optometry visit for glaucoma screening and checkup Recommended yearly dental visit for hygiene and checkup  Vaccinations: Influenza vaccine: decline Pneumococcal vaccine: decline Tdap vaccine: decline Shingles vaccine: decline   Covid-19:10/28/2019, 09/25/2019  Advanced directives: Advance directive discussed with you today. Even though you declined this today please call our office should you change your mind and we can give you the proper paperwork for you to fill out.   Conditions/risks identified: none  Next appointment: 06/28/2020 at 8:30 Follow up in one year for your annual wellness visit    Preventive Care 65 Years and Older, Female Preventive care refers to lifestyle choices and visits with your health care provider that can promote health and wellness. What does preventive care include?  A yearly physical exam. This is also called an annual well check.  Dental exams once or twice a year.  Routine eye exams. Ask your health care provider how often you should have your eyes checked.  Personal lifestyle choices, including:  Daily care of your teeth and gums.  Regular physical activity.  Eating a healthy diet.  Avoiding tobacco and drug use.  Limiting alcohol use.  Practicing safe sex.  Taking low-dose aspirin every day.  Taking vitamin and mineral supplements as recommended by your health care provider. What happens during an annual well check? The services and screenings done by your health care provider during your annual well check will depend on your age, overall health, lifestyle  risk factors, and family history of disease. Counseling  Your health care provider may ask you questions about your:  Alcohol use.  Tobacco use.  Drug use.  Emotional well-being.  Home and relationship well-being.  Sexual activity.  Eating habits.  History of falls.  Memory and ability to understand (cognition).  Work and work Statistician.  Reproductive health. Screening  You may have the following tests or measurements:  Height, weight, and BMI.  Blood pressure.  Lipid and cholesterol levels. These may be checked every 5 years, or more frequently if you are over 38 years old.  Skin check.  Lung cancer screening. You may have this screening every year starting at age 16 if you have a 30-pack-year history of smoking and currently smoke or have quit within the past 15 years.  Fecal occult blood test (FOBT) of the stool. You may have this test every year starting at age 63.  Flexible sigmoidoscopy or colonoscopy. You may have a sigmoidoscopy every 5 years or a colonoscopy every 10 years starting at age 47.  Hepatitis C blood test.  Hepatitis B blood test.  Sexually transmitted disease (STD) testing.  Diabetes screening. This is done by checking your blood sugar (glucose) after you have not eaten for a while (fasting). You may have this done every 1-3 years.  Bone density scan. This is done to screen for osteoporosis. You may have this done starting at age 66.  Mammogram. This may be done every 1-2 years. Talk to your health care provider about how often you should have regular mammograms. Talk with your health care provider about your test results, treatment options, and if necessary, the need for more tests. Vaccines  Your health care provider  may recommend certain vaccines, such as:  Influenza vaccine. This is recommended every year.  Tetanus, diphtheria, and acellular pertussis (Tdap, Td) vaccine. You may need a Td booster every 10 years.  Zoster vaccine.  You may need this after age 18.  Pneumococcal 13-valent conjugate (PCV13) vaccine. One dose is recommended after age 23.  Pneumococcal polysaccharide (PPSV23) vaccine. One dose is recommended after age 73. Talk to your health care provider about which screenings and vaccines you need and how often you need them. This information is not intended to replace advice given to you by your health care provider. Make sure you discuss any questions you have with your health care provider. Document Released: 07/23/2015 Document Revised: 03/15/2016 Document Reviewed: 04/27/2015 Elsevier Interactive Patient Education  2017 [place] Prevention in the Home Falls can cause injuries. They can happen to people of all ages. There are many things you can do to make your home safe and to help prevent falls. What can I do on the outside of my home?  Regularly fix the edges of walkways and driveways and fix any cracks.  Remove anything that might make you trip as you walk through a door, such as a raised step or threshold.  Trim any bushes or trees on the path to your home.  Use bright outdoor lighting.  Clear any walking paths of anything that might make someone trip, such as rocks or tools.  Regularly check to see if handrails are loose or broken. Make sure that both sides of any steps have handrails.  Any raised decks and porches should have guardrails on the edges.  Have any leaves, snow, or ice cleared regularly.  Use sand or salt on walking paths during winter.  Clean up any spills in your garage right away. This includes oil or grease spills. What can I do in the bathroom?  Use night lights.  Install grab bars by the toilet and in the tub and shower. Do not use towel bars as grab bars.  Use non-skid mats or decals in the tub or shower.  If you need to sit down in the shower, use a plastic, non-slip stool.  Keep the floor dry. Clean up any water that spills on the floor as soon  as it happens.  Remove soap buildup in the tub or shower regularly.  Attach bath mats securely with double-sided non-slip rug tape.  Do not have throw rugs and other things on the floor that can make you trip. What can I do in the bedroom?  Use night lights.  Make sure that you have a light by your bed that is easy to reach.  Do not use any sheets or blankets that are too big for your bed. They should not hang down onto the floor.  Have a firm chair that has side arms. You can use this for support while you get dressed.  Do not have throw rugs and other things on the floor that can make you trip. What can I do in the kitchen?  Clean up any spills right away.  Avoid walking on wet floors.  Keep items that you use a lot in easy-to-reach places.  If you need to reach something above you, use a strong step stool that has a grab bar.  Keep electrical cords out of the way.  Do not use floor polish or wax that makes floors slippery. If you must use wax, use non-skid floor wax.  Do not have throw rugs  and other things on the floor that can make you trip. What can I do with my stairs?  Do not leave any items on the stairs.  Make sure that there are handrails on both sides of the stairs and use them. Fix handrails that are broken or loose. Make sure that handrails are as long as the stairways.  Check any carpeting to make sure that it is firmly attached to the stairs. Fix any carpet that is loose or worn.  Avoid having throw rugs at the top or bottom of the stairs. If you do have throw rugs, attach them to the floor with carpet tape.  Make sure that you have a light switch at the top of the stairs and the bottom of the stairs. If you do not have them, ask someone to add them for you. What else can I do to help prevent falls?  Wear shoes that:  Do not have high heels.  Have rubber bottoms.  Are comfortable and fit you well.  Are closed at the toe. Do not wear sandals.  If  you use a stepladder:  Make sure that it is fully opened. Do not climb a closed stepladder.  Make sure that both sides of the stepladder are locked into place.  Ask someone to hold it for you, if possible.  Clearly mark and make sure that you can see:  Any grab bars or handrails.  First and last steps.  Where the edge of each step is.  Use tools that help you move around (mobility aids) if they are needed. These include:  Canes.  Walkers.  Scooters.  Crutches.  Turn on the lights when you go into a dark area. Replace any light bulbs as soon as they burn out.  Set up your furniture so you have a clear path. Avoid moving your furniture around.  If any of your floors are uneven, fix them.  If there are any pets around you, be aware of where they are.  Review your medicines with your doctor. Some medicines can make you feel dizzy. This can increase your chance of falling. Ask your doctor what other things that you can do to help prevent falls. This information is not intended to replace advice given to you by your health care provider. Make sure you discuss any questions you have with your health care provider. Document Released: 04/22/2009 Document Revised: 12/02/2015 Document Reviewed: 07/31/2014 Elsevier Interactive Patient Education  2017 [employer].

## 2020-01-01 LAB — CBC
Hematocrit: 34.3 % (ref 34.0–46.6)
Hemoglobin: 10.8 g/dL — ABNORMAL LOW (ref 11.1–15.9)
MCH: 28.7 pg (ref 26.6–33.0)
MCHC: 31.5 g/dL (ref 31.5–35.7)
MCV: 91 fL (ref 79–97)
Platelets: 316 10*3/uL (ref 150–450)
RBC: 3.76 x10E6/uL — ABNORMAL LOW (ref 3.77–5.28)
RDW: 13.5 % (ref 11.7–15.4)
WBC: 3.3 10*3/uL — ABNORMAL LOW (ref 3.4–10.8)

## 2020-01-01 LAB — CMP14+EGFR
ALT: 17 IU/L (ref 0–32)
AST: 25 IU/L (ref 0–40)
Albumin/Globulin Ratio: 1.8 (ref 1.2–2.2)
Albumin: 4.6 g/dL (ref 3.7–4.7)
Alkaline Phosphatase: 55 IU/L (ref 48–121)
BUN/Creatinine Ratio: 12 (ref 12–28)
BUN: 12 mg/dL (ref 8–27)
Bilirubin Total: 0.3 mg/dL (ref 0.0–1.2)
CO2: 26 mmol/L (ref 20–29)
Calcium: 10 mg/dL (ref 8.7–10.3)
Chloride: 103 mmol/L (ref 96–106)
Creatinine, Ser: 1.03 mg/dL — ABNORMAL HIGH (ref 0.57–1.00)
GFR calc Af Amer: 62 mL/min/{1.73_m2} (ref >59)
GFR calc non Af Amer: 54 mL/min/{1.73_m2} — ABNORMAL LOW (ref >59)
Globulin, Total: 2.5 g/dL (ref 1.5–4.5)
Glucose: 91 mg/dL (ref 65–99)
Potassium: 4.3 mmol/L (ref 3.5–5.2)
Sodium: 142 mmol/L (ref 134–144)
Total Protein: 7.1 g/dL (ref 6.0–8.5)

## 2020-01-01 LAB — LIPID PANEL
Chol/HDL Ratio: 2.9 ratio (ref 0.0–4.4)
Cholesterol, Total: 149 mg/dL (ref 100–199)
HDL: 51 mg/dL (ref >39)
LDL Chol Calc (NIH): 80 mg/dL (ref 0–99)
Triglycerides: 98 mg/dL (ref 0–149)
VLDL Cholesterol Cal: 18 mg/dL (ref 5–40)

## 2020-02-17 ENCOUNTER — Other Ambulatory Visit: Payer: Self-pay | Admitting: Nurse Practitioner

## 2020-02-17 DIAGNOSIS — I10 Essential (primary) hypertension: Secondary | ICD-10-CM

## 2020-03-08 ENCOUNTER — Other Ambulatory Visit: Payer: Self-pay | Admitting: Nurse Practitioner

## 2020-03-08 DIAGNOSIS — I10 Essential (primary) hypertension: Secondary | ICD-10-CM

## 2020-03-24 DIAGNOSIS — E0590 Thyrotoxicosis, unspecified without thyrotoxic crisis or storm: Secondary | ICD-10-CM | POA: Diagnosis not present

## 2020-03-24 DIAGNOSIS — D72819 Decreased white blood cell count, unspecified: Secondary | ICD-10-CM | POA: Diagnosis not present

## 2020-03-24 DIAGNOSIS — M0589 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: Secondary | ICD-10-CM | POA: Diagnosis not present

## 2020-03-24 DIAGNOSIS — Z79899 Other long term (current) drug therapy: Secondary | ICD-10-CM | POA: Diagnosis not present

## 2020-03-24 DIAGNOSIS — M549 Dorsalgia, unspecified: Secondary | ICD-10-CM | POA: Diagnosis not present

## 2020-05-31 ENCOUNTER — Ambulatory Visit: Payer: Medicare Other | Attending: Family

## 2020-05-31 DIAGNOSIS — Z23 Encounter for immunization: Secondary | ICD-10-CM

## 2020-06-12 ENCOUNTER — Other Ambulatory Visit: Payer: Self-pay | Admitting: Nurse Practitioner

## 2020-06-12 DIAGNOSIS — I10 Essential (primary) hypertension: Secondary | ICD-10-CM

## 2020-06-17 DIAGNOSIS — M0589 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: Secondary | ICD-10-CM | POA: Diagnosis not present

## 2020-06-17 DIAGNOSIS — E0520 Thyrotoxicosis with toxic multinodular goiter without thyrotoxic crisis or storm: Secondary | ICD-10-CM | POA: Diagnosis not present

## 2020-06-17 LAB — CBC AND DIFFERENTIAL
HCT: 38 (ref 36–46)
Hemoglobin: 11.7 — AB (ref 12.0–16.0)
Platelets: 340 (ref 150–399)
WBC: 3.2

## 2020-06-17 LAB — CBC: RBC: 4.02 (ref 3.87–5.11)

## 2020-06-17 LAB — BASIC METABOLIC PANEL
BUN: 13 (ref 4–21)
CO2: 27 — AB (ref 13–22)
Chloride: 108 (ref 99–108)
Creatinine: 1.3 — AB (ref 0.5–1.1)
Glucose: 136
Potassium: 4.6 (ref 3.4–5.3)
Sodium: 149 — AB (ref 137–147)

## 2020-06-17 LAB — COMPREHENSIVE METABOLIC PANEL: Calcium: 9.6 (ref 8.7–10.7)

## 2020-06-23 DIAGNOSIS — M0589 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: Secondary | ICD-10-CM | POA: Diagnosis not present

## 2020-06-23 DIAGNOSIS — E0590 Thyrotoxicosis, unspecified without thyrotoxic crisis or storm: Secondary | ICD-10-CM | POA: Diagnosis not present

## 2020-06-23 DIAGNOSIS — D72819 Decreased white blood cell count, unspecified: Secondary | ICD-10-CM | POA: Diagnosis not present

## 2020-06-23 DIAGNOSIS — Z79899 Other long term (current) drug therapy: Secondary | ICD-10-CM | POA: Diagnosis not present

## 2020-06-23 DIAGNOSIS — M549 Dorsalgia, unspecified: Secondary | ICD-10-CM | POA: Diagnosis not present

## 2020-06-24 ENCOUNTER — Other Ambulatory Visit: Payer: Self-pay | Admitting: Nurse Practitioner

## 2020-06-24 DIAGNOSIS — I10 Essential (primary) hypertension: Secondary | ICD-10-CM

## 2020-06-28 ENCOUNTER — Encounter: Payer: Self-pay | Admitting: Nurse Practitioner

## 2020-06-28 ENCOUNTER — Other Ambulatory Visit: Payer: Self-pay

## 2020-06-28 ENCOUNTER — Ambulatory Visit (INDEPENDENT_AMBULATORY_CARE_PROVIDER_SITE_OTHER): Payer: Medicare PPO | Admitting: Nurse Practitioner

## 2020-06-28 VITALS — BP 122/76 | HR 59 | Temp 98.2°F | Ht 64.6 in | Wt 136.0 lb

## 2020-06-28 DIAGNOSIS — E782 Mixed hyperlipidemia: Secondary | ICD-10-CM

## 2020-06-28 DIAGNOSIS — D649 Anemia, unspecified: Secondary | ICD-10-CM

## 2020-06-28 DIAGNOSIS — I10 Essential (primary) hypertension: Secondary | ICD-10-CM | POA: Diagnosis not present

## 2020-06-28 NOTE — Patient Instructions (Signed)

## 2020-06-28 NOTE — Progress Notes (Signed)
I,Yamilka Roman [employer] as a Education administrator for [employer], FNP.,have documented all relevant documentation on the behalf of [name], FNP,as directed by  [name], FNP while in the presence of [name], [place]. This visit occurred during the SARS-CoV-2 public health emergency.  Safety protocols were in place, including screening questions prior to the visit, additional usage of staff PPE, and extensive cleaning of exam room while observing appropriate contact time as indicated for disinfecting solutions.  Subjective:     Patient ID: Priscilla Thornton , female    DOB: 03-Sep-1945 , 74 y.o.   MRN: [id number]   Chief Complaint  Patient presents with  . Hypertension    HPI  Patient here for a blood pressure f/u  Wt Readings from Last 3 Encounters: 06/28/20 : 136 lb (61.7 kg) 12/31/19 : 136 lb (61.7 kg) 12/31/19 : 136 lb 9.6 oz (62 kg)  Hypertension This is a chronic problem. The current episode started more than 1 year ago. The problem is unchanged. The problem is controlled. Pertinent negatives include no chest pain, headaches, palpitations or shortness of breath. There are no associated agents to hypertension. There are no known risk factors for coronary artery disease. Past treatments include angiotensin blockers. The current treatment provides moderate improvement. There are no compliance problems.  There is no history of angina, kidney disease or CVA. Identifiable causes of hypertension include a thyroid problem. There is no history of chronic renal disease.     Past Medical History:  Diagnosis Date  . Arthritis   . Hyperlipidemia   . Hypertension      Family History  Problem Relation Age of Onset  . Diabetes Mother   . Heart disease Father   . Colon cancer Neg Hx   . Esophageal cancer Neg Hx   . Rectal cancer Neg Hx   . Stomach cancer Neg Hx      Current Outpatient Medications:  .  aspirin 81 MG tablet, Take 81 mg by mouth daily., Disp: , Rfl:  .  atorvastatin  (LIPITOR) 20 MG tablet, TAKE 1 TABLET(20 MG) BY MOUTH DAILY, Disp: 90 tablet, Rfl: 1 .  fish oil-omega-3 fatty acids 1000 MG capsule, Take 1 g by mouth daily., Disp: , Rfl:  .  folic acid (FOLVITE) 1 MG tablet, Take 1 mg by mouth daily., Disp: , Rfl:  .  hydrochlorothiazide (HYDRODIURIL) 12.5 MG tablet, TAKE 1 TABLET(12.5 MG) BY MOUTH DAILY, Disp: 90 tablet, Rfl: 0 .  ibuprofen (ADVIL,MOTRIN) 200 MG tablet, Take 200 mg by mouth every 6 (six) hours as needed., Disp: , Rfl:  .  losartan (COZAAR) 100 MG tablet, TAKE 1 TABLET(100 MG) BY MOUTH DAILY, Disp: 90 tablet, Rfl: 1 .  methimazole (TAPAZOLE) 5 MG tablet, Take 5 mg by mouth daily., Disp: , Rfl:  .  methotrexate (50 MG/ML) 1 g injection, Inject 1 mg into the vein once a week., Disp: , Rfl:  .  Multiple Vitamins-Calcium (ONE-A-DAY WOMENS FORMULA PO), Take 1 tablet by mouth daily., Disp: , Rfl:  .  potassium chloride (KLOR-CON) 10 MEQ tablet, TAKE 1 TABLET BY MOUTH EVERY DAY WITH FOOD, Disp: 90 tablet, Rfl: 1   No Known Allergies   Review of Systems  Constitutional: Negative.   Respiratory: Negative for apnea and shortness of breath.   Cardiovascular: Negative for chest pain and palpitations.  Neurological: Negative for headaches.  Psychiatric/Behavioral: Negative.      Today's Vitals   06/28/20 0840  BP: 122/76  Pulse: (!) 59  Temp: 98.2 F (36.8 C)  TempSrc: Oral  Weight: 136 lb (61.7 kg)  Height: 5' 4.6" (1.641 m)  PainSc: 0-No pain   Body mass index is 22.91 kg/m.   Objective:  Physical Exam Vitals reviewed.  Constitutional:      Appearance: She is well-developed.  HENT:     Head: Normocephalic and atraumatic.  Eyes:     Pupils: Pupils are equal, round, and reactive to light.  Cardiovascular:     Rate and Rhythm: Normal rate and regular rhythm.     Pulses: Normal pulses.     Heart sounds: Normal heart sounds. No murmur heard.   Pulmonary:     Effort: Pulmonary effort is normal. No respiratory distress.      Breath sounds: Normal breath sounds. No wheezing.  Musculoskeletal:        General: Normal range of motion.  Skin:    General: Skin is warm and dry.     Capillary Refill: Capillary refill takes less than 2 seconds.  Neurological:     General: No focal deficit present.     Mental Status: She is alert and oriented to person, place, and time.     Cranial Nerves: No cranial nerve deficit.  Psychiatric:        Mood and Affect: Mood normal.        Behavior: Behavior normal.        Thought Content: Thought content normal.        Judgment: Judgment normal.         Assessment And Plan:     1. Essential hypertension  Chronic, excellent control  Continue with current medications - CBC no Diff - CMP14+EGFR  2. Low hemoglobin  Will check Hgb today if still low will add an iron studies, if those are low will treat with iron supplement. Otherwise if not will refer to GI for further evaluation. She has continued to refuse a referral for a Colonoscopy.  I have offered cologuard as well and she has not returned to [facility] - CBC no Diff  3. Mixed hyperlipidemia  Chronic, well controlled within normal limits  Tolerating medications well  No labs this visit as was normal last visit.   Patient was given opportunity to ask questions. Patient verbalized understanding of the plan and was able to repeat key elements of the plan. All questions were answered to their satisfaction.    Teola Bradley, FNP, have reviewed all documentation for this visit. The documentation on 06/28/20 for the exam, diagnosis, procedures, and orders are all accurate and complete.  THE PATIENT IS ENCOURAGED TO PRACTICE SOCIAL DISTANCING DUE TO THE COVID-19 PANDEMIC.

## 2020-06-29 LAB — CBC
Hematocrit: 35 % (ref 34.0–46.6)
Hemoglobin: 11.5 g/dL (ref 11.1–15.9)
MCH: 29.7 pg (ref 26.6–33.0)
MCHC: 32.9 g/dL (ref 31.5–35.7)
MCV: 90 fL (ref 79–97)
Platelets: 305 10*3/uL (ref 150–450)
RBC: 3.87 x10E6/uL (ref 3.77–5.28)
RDW: 13 % (ref 11.7–15.4)
WBC: 3.1 10*3/uL — ABNORMAL LOW (ref 3.4–10.8)

## 2020-06-29 LAB — CMP14+EGFR
ALT: 31 IU/L (ref 0–32)
AST: 37 IU/L (ref 0–40)
Albumin/Globulin Ratio: 1.6 (ref 1.2–2.2)
Albumin: 4.5 g/dL (ref 3.7–4.7)
Alkaline Phosphatase: 51 IU/L (ref 44–121)
BUN/Creatinine Ratio: 12 (ref 12–28)
BUN: 13 mg/dL (ref 8–27)
Bilirubin Total: 0.6 mg/dL (ref 0.0–1.2)
CO2: 26 mmol/L (ref 20–29)
Calcium: 10.6 mg/dL — ABNORMAL HIGH (ref 8.7–10.3)
Chloride: 104 mmol/L (ref 96–106)
Creatinine, Ser: 1.09 mg/dL — ABNORMAL HIGH (ref 0.57–1.00)
GFR calc Af Amer: 58 mL/min/{1.73_m2} — ABNORMAL LOW (ref >59)
GFR calc non Af Amer: 50 mL/min/{1.73_m2} — ABNORMAL LOW (ref >59)
Globulin, Total: 2.8 g/dL (ref 1.5–4.5)
Glucose: 125 mg/dL — ABNORMAL HIGH (ref 65–99)
Potassium: 3.6 mmol/L (ref 3.5–5.2)
Sodium: 144 mmol/L (ref 134–144)
Total Protein: 7.3 g/dL (ref 6.0–8.5)

## 2020-08-11 ENCOUNTER — Other Ambulatory Visit: Payer: Self-pay | Admitting: Nurse Practitioner

## 2020-08-11 DIAGNOSIS — I10 Essential (primary) hypertension: Secondary | ICD-10-CM

## 2020-08-12 ENCOUNTER — Other Ambulatory Visit: Payer: Self-pay | Admitting: Nurse Practitioner

## 2020-08-12 DIAGNOSIS — I10 Essential (primary) hypertension: Secondary | ICD-10-CM

## 2020-09-14 NOTE — Progress Notes (Signed)
   Covid-19 Vaccination Clinic  Name:  Priscilla Thornton    MRN: [id number] DOB: Jun 09, 1946  09/14/2020  Priscilla Thornton was observed post Covid-19 immunization for 15 minutes without incident. She was provided with Vaccine Information Sheet and instruction to access the V-Safe system.   Priscilla Thornton was instructed to call 911 with any severe reactions post vaccine: Marland Kitchen Difficulty breathing  . Swelling of face and throat  . A fast heartbeat  . A bad rash all over body  . Dizziness and weakness   Immunizations Administered    Name Date Dose VIS Date Route   Moderna Covid-19 Booster Vaccine 05/31/2020  3:30 PM 0.25 mL 04/28/2020 Intramuscular   Manufacturer: Moderna   Lot: [id number]   [place]: [id number]

## 2020-09-21 DIAGNOSIS — M0589 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: Secondary | ICD-10-CM | POA: Diagnosis not present

## 2020-09-21 DIAGNOSIS — D72819 Decreased white blood cell count, unspecified: Secondary | ICD-10-CM | POA: Diagnosis not present

## 2020-09-21 DIAGNOSIS — M549 Dorsalgia, unspecified: Secondary | ICD-10-CM | POA: Diagnosis not present

## 2020-09-21 DIAGNOSIS — E0590 Thyrotoxicosis, unspecified without thyrotoxic crisis or storm: Secondary | ICD-10-CM | POA: Diagnosis not present

## 2020-09-21 DIAGNOSIS — Z79899 Other long term (current) drug therapy: Secondary | ICD-10-CM | POA: Diagnosis not present

## 2020-09-27 ENCOUNTER — Encounter: Payer: Self-pay | Admitting: Nurse Practitioner

## 2020-09-27 ENCOUNTER — Other Ambulatory Visit: Payer: Self-pay

## 2020-09-27 ENCOUNTER — Ambulatory Visit (INDEPENDENT_AMBULATORY_CARE_PROVIDER_SITE_OTHER): Payer: Medicare PPO | Admitting: Nurse Practitioner

## 2020-09-27 VITALS — BP 102/60 | HR 57 | Temp 98.5°F | Ht 64.4 in | Wt 136.6 lb

## 2020-09-27 DIAGNOSIS — E0590 Thyrotoxicosis, unspecified without thyrotoxic crisis or storm: Secondary | ICD-10-CM

## 2020-09-27 DIAGNOSIS — Z79899 Other long term (current) drug therapy: Secondary | ICD-10-CM | POA: Diagnosis not present

## 2020-09-27 DIAGNOSIS — E782 Mixed hyperlipidemia: Secondary | ICD-10-CM | POA: Diagnosis not present

## 2020-09-27 DIAGNOSIS — I10 Essential (primary) hypertension: Secondary | ICD-10-CM

## 2020-09-27 MED ORDER — LOSARTAN POTASSIUM 100 MG PO TABS: 100.0000 mg | ORAL_TABLET | Freq: Every day | ORAL | 1 refills | Status: DC

## 2020-09-27 MED ORDER — HYDROCHLOROTHIAZIDE 12.5 MG PO TABS
12.5000 mg | ORAL_TABLET | Freq: Every day | ORAL | 1 refills | Status: DC
Start: 2020-09-27 — End: 2021-06-14

## 2020-09-27 MED ORDER — ATORVASTATIN CALCIUM 20 MG PO TABS: 20.0000 mg | ORAL_TABLET | Freq: Every day | ORAL | 1 refills | Status: DC

## 2020-09-27 NOTE — Progress Notes (Signed)
I,Yamilka [employer] as a Education administrator for [employer], FNP.,have documented all relevant documentation on the behalf of [name], FNP,as directed by  [name], FNP while in the presence of [name], [place]. This visit occurred during the SARS-CoV-2 public health emergency.  Safety protocols were in place, including screening questions prior to the visit, additional usage of staff PPE, and extensive cleaning of exam room while observing appropriate contact time as indicated for disinfecting solutions.  Subjective:     Patient ID: Priscilla Thornton , female    DOB: 05/07/1946 , 75 y.o.   MRN: [id number]   Chief Complaint  Patient presents with  . Hypertension    HPI  Patient here for a blood pressure f/u.    Wt Readings from Last 3 Encounters: 09/27/20 : 136 lb 9.6 oz (62 kg) 06/28/20 : 136 lb (61.7 kg) 12/31/19 : 136 lb (61.7 kg)  Hypertension This is a chronic problem. The current episode started more than 1 year ago. The problem is unchanged. The problem is controlled. Pertinent negatives include no chest pain, headaches, palpitations or shortness of breath. There are no associated agents to hypertension. There are no known risk factors for coronary artery disease. Past treatments include angiotensin blockers. The current treatment provides moderate improvement. There are no compliance problems.  There is no history of angina, kidney disease or CVA. Identifiable causes of hypertension include a thyroid problem. There is no history of chronic renal disease.     Past Medical History:  Diagnosis Date  . Arthritis   . Hyperlipidemia   . Hypertension      Family History  Problem Relation Age of Onset  . Diabetes Mother   . Heart disease Father   . Colon cancer Neg Hx   . Esophageal cancer Neg Hx   . Rectal cancer Neg Hx   . Stomach cancer Neg Hx      Current Outpatient Medications:  .  aspirin 81 MG tablet, Take 81 mg by mouth daily., Disp: , Rfl:  .  fish  oil-omega-3 fatty acids 1000 MG capsule, Take 1 g by mouth daily., Disp: , Rfl:  .  folic acid (FOLVITE) 1 MG tablet, Take 1 mg by mouth daily., Disp: , Rfl:  .  ibuprofen (ADVIL,MOTRIN) 200 MG tablet, Take 200 mg by mouth every 6 (six) hours as needed., Disp: , Rfl:  .  KLOR-CON M10 10 MEQ tablet, TAKE 1 TABLET BY MOUTH EVERY DAY WITH FOOD, Disp: 90 tablet, Rfl: 1 .  methimazole (TAPAZOLE) 5 MG tablet, Take 5 mg by mouth daily., Disp: , Rfl:  .  methotrexate (50 MG/ML) 1 g injection, Inject 1 mg into the vein once a week., Disp: , Rfl:  .  Multiple Vitamins-Calcium (ONE-A-DAY WOMENS FORMULA PO), Take 1 tablet by mouth daily., Disp: , Rfl:  .  atorvastatin (LIPITOR) 20 MG tablet, Take 1 tablet (20 mg total) by mouth daily., Disp: 90 tablet, Rfl: 1 .  hydrochlorothiazide (HYDRODIURIL) 12.5 MG tablet, Take 1 tablet (12.5 mg total) by mouth daily., Disp: 90 tablet, Rfl: 1 .  losartan (COZAAR) 100 MG tablet, Take 1 tablet (100 mg total) by mouth daily., Disp: 90 tablet, Rfl: 1   No Known Allergies   Review of Systems  Constitutional: Positive for appetite change (decreased).  Respiratory: Negative for shortness of breath.   Cardiovascular: Negative for chest pain, palpitations and leg swelling.  Neurological: Negative for dizziness and headaches.  Psychiatric/Behavioral: Negative.      Today's Vitals  09/27/20 0853  BP: 102/60  Pulse: (!) 57  Temp: 98.5 F (36.9 C)  TempSrc: Oral  Weight: 136 lb 9.6 oz (62 kg)  Height: 5' 4.4" (1.636 m)  PainSc: 0-No pain   Body mass index is 23.16 kg/m.   Objective:  Physical Exam Constitutional:      General: She is not in acute distress.    Appearance: Normal appearance. She is obese.  Cardiovascular:     Rate and Rhythm: Normal rate and regular rhythm.     Pulses: Normal pulses.     Heart sounds: Normal heart sounds. No murmur heard.   Pulmonary:     Effort: Pulmonary effort is normal. No respiratory distress.     Breath sounds:  Normal breath sounds. No wheezing.  Abdominal:     General: Abdomen is flat. Bowel sounds are normal.     Palpations: Abdomen is soft.  Musculoskeletal:        General: Normal range of motion.     Cervical back: Normal range of motion and neck supple.  Skin:    General: Skin is warm and dry.     Capillary Refill: Capillary refill takes less than 2 seconds.  Neurological:     General: No focal deficit present.     Mental Status: She is alert and oriented to person, place, and time.  Psychiatric:        Mood and Affect: Mood normal.        Behavior: Behavior normal.        Thought Content: Thought content normal.        Judgment: Judgment normal.         Assessment And Plan:     1. Essential hypertension . B/P is controlled.  . CMP ordered to check renal function.  . The importance of regular exercise and dietary modification was stressed to the patient.  . Stressed importance of losing ten percent of her body weight to help with B/P control.  - hydrochlorothiazide (HYDRODIURIL) 12.5 MG tablet; Take 1 tablet (12.5 mg total) by mouth daily.  Dispense: 90 tablet; Refill: 1 - losartan (COZAAR) 100 MG tablet; Take 1 tablet (100 mg total) by mouth daily.  Dispense: 90 tablet; Refill: 1 - CMP14+EGFR - CBC  2. Mixed hyperlipidemia  Chronic, controlled  Continue with current medications - atorvastatin (LIPITOR) 20 MG tablet; Take 1 tablet (20 mg total) by mouth daily.  Dispense: 90 tablet; Refill: 1 - Lipid panel  3. Hyperthyroidism  Chronic, being followed by Dr. Noemi Chapel  4. Other long term (current) drug therapy - CBC     Patient was given opportunity to ask questions. Patient verbalized understanding of the plan and was able to repeat key elements of the plan. All questions were answered to their satisfaction.  [name], FNP   I, [name], FNP, have reviewed all documentation for this visit. The documentation on 09/27/20 for the exam, diagnosis, procedures, and  orders are all accurate and complete.   IF YOU HAVE BEEN REFERRED TO A SPECIALIST, IT MAY TAKE 1-2 WEEKS TO SCHEDULE/PROCESS THE REFERRAL. IF YOU HAVE NOT HEARD FROM US/SPECIALIST IN TWO WEEKS, PLEASE GIVE [place] A CALL AT [phone].   THE PATIENT IS ENCOURAGED TO PRACTICE SOCIAL DISTANCING DUE TO THE COVID-19 PANDEMIC.

## 2020-09-27 NOTE — Progress Notes (Deleted)
Established Patient Office Visit  Subjective:  Patient ID: Priscilla Thornton, female    DOB: 22-Jan-1946  Age: 74 y.o. MRN: [id number]  CC:  Chief Complaint  Patient presents with  . Hypertension    HPI Priscilla Thornton presents for a hypertension follow up.  Pertinent negatives include chest pain, palpitations, shortness of breath, dizziness, and blurred vision.   The patient is not monitoring her blood pressure at home.  She denies side effects from medications and endorses medication compliance.  She reports running out of HCTZ last Monday.  She reports a decreased appetite over the past 3-4 months.  She is eating twice per day, however not as much as she was previously.    She has an appointment with her endocrinologist, Dr. Chalmers Cater next month.    Past Medical History:  Diagnosis Date  . Arthritis   . Hyperlipidemia   . Hypertension     No past surgical history on file.  Family History  Problem Relation Age of Onset  . Diabetes Mother   . Heart disease Father   . Colon cancer Neg Hx   . Esophageal cancer Neg Hx   . Rectal cancer Neg Hx   . Stomach cancer Neg Hx     Social History   Socioeconomic History  . Marital status: Single    Spouse name: Not on file  . Number of children: Not on file  . Years of education: Not on file  . Highest education level: Not on file  Occupational History  . Not on file  Tobacco Use  . Smoking status: Former Smoker    Packs/day: 0.20    Types: Cigarettes    Quit date: 2016    Years since quitting: 6.2  . Smokeless tobacco: Never Used  . Tobacco comment: 2-3 cigaretts a day  Vaping Use  . Vaping Use: Never used  Substance and Sexual Activity  . Alcohol use: No  . Drug use: No  . Sexual activity: Not Currently  Other Topics Concern  . Not on file  Social History Narrative  . Not on file   Social Determinants of Health   Financial Resource Strain: Low Risk   . Difficulty of Paying Living Expenses: Not hard at all  Food  Insecurity: No Food Insecurity  . Worried About Charity fundraiser in the Last Year: Never true  . Ran Out of Food in the Last Year: Never true  Transportation Needs: No Transportation Needs  . Lack of Transportation (Medical): No  . Lack of Transportation (Non-Medical): No  Physical Activity: Inactive  . Days of Exercise per Week: 0 days  . Minutes of Exercise per Session: 0 min  Stress: No Stress Concern Present  . Feeling of Stress : Not at all  Social Connections: Not on file  Intimate Partner Violence: Not on file    Outpatient Medications Prior to Visit  Medication Sig Dispense Refill  . aspirin 81 MG tablet Take 81 mg by mouth daily.    Marland Kitchen atorvastatin (LIPITOR) 20 MG tablet TAKE 1 TABLET(20 MG) BY MOUTH DAILY 90 tablet 1  . fish oil-omega-3 fatty acids 1000 MG capsule Take 1 g by mouth daily.    . folic acid (FOLVITE) 1 MG tablet Take 1 mg by mouth daily.    . hydrochlorothiazide (HYDRODIURIL) 12.5 MG tablet TAKE 1 TABLET(12.5 MG) BY MOUTH DAILY 90 tablet 0  . ibuprofen (ADVIL,MOTRIN) 200 MG tablet Take 200 mg by mouth every 6 (six) hours  as needed.    Marland Kitchen KLOR-CON M10 10 MEQ tablet TAKE 1 TABLET BY MOUTH EVERY DAY WITH FOOD 90 tablet 1  . losartan (COZAAR) 100 MG tablet TAKE 1 TABLET(100 MG) BY MOUTH DAILY 90 tablet 1  . methimazole (TAPAZOLE) 5 MG tablet Take 5 mg by mouth daily.    . methotrexate (50 MG/ML) 1 g injection Inject 1 mg into the vein once a week.    . Multiple Vitamins-Calcium (ONE-A-DAY WOMENS FORMULA PO) Take 1 tablet by mouth daily.     No facility-administered medications prior to visit.    No Known Allergies  ROS Review of Systems  Constitutional: Positive for appetite change (decreased appetite).      Objective:    Physical Exam Constitutional:      Appearance: Normal appearance.  Cardiovascular:     Rate and Rhythm: Normal rate and regular rhythm.     Pulses: Normal pulses.     Heart sounds: Normal heart sounds.  Pulmonary:     Effort:  Pulmonary effort is normal.     Breath sounds: Normal breath sounds.  Psychiatric:        Mood and Affect: Mood normal.        Behavior: Behavior normal.     BP 102/60 (BP Location: Left Arm, Patient Position: Sitting, Cuff Size: Small)   Pulse (!) 57   Temp 98.5 F (36.9 C) (Oral)   Ht 5' 4.4" (1.636 m)   Wt 136 lb 9.6 oz (62 kg)   BMI 23.16 kg/m  Wt Readings from Last 3 Encounters:  09/27/20 136 lb 9.6 oz (62 kg)  06/28/20 136 lb (61.7 kg)  12/31/19 136 lb (61.7 kg)     There are no preventive care reminders to display for this patient.  There are no preventive care reminders to display for this patient.  No results found for: TSH Lab Results  Component Value Date   WBC 3.1 (L) 06/28/2020   HGB 11.5 06/28/2020   HCT 35.0 06/28/2020   MCV 90 06/28/2020   PLT 305 06/28/2020   Lab Results  Component Value Date   NA 144 06/28/2020   K 3.6 06/28/2020   CO2 26 06/28/2020   GLUCOSE 125 (H) 06/28/2020   BUN 13 06/28/2020   CREATININE 1.09 (H) 06/28/2020   BILITOT 0.6 06/28/2020   ALKPHOS 51 06/28/2020   AST 37 06/28/2020   ALT 31 06/28/2020   PROT 7.3 06/28/2020   ALBUMIN 4.5 06/28/2020   CALCIUM 10.6 (H) 06/28/2020   Lab Results  Component Value Date   CHOL 149 12/31/2019   Lab Results  Component Value Date   HDL 51 12/31/2019   Lab Results  Component Value Date   LDLCALC 80 12/31/2019   Lab Results  Component Value Date   TRIG 98 12/31/2019   Lab Results  Component Value Date   CHOLHDL 2.9 12/31/2019   No results found for: HGBA1C    Assessment & Plan:   Problem List Items Addressed This Visit      Cardiovascular and Mediastinum   Essential hypertension - Primary      No orders of the defined types were placed in this encounter.   Follow-up: Return in about 3 months (around 12/28/2020) for HTN F/U .    Anastasio Auerbach, RN

## 2020-09-27 NOTE — Patient Instructions (Signed)

## 2020-09-30 ENCOUNTER — Encounter: Payer: Self-pay | Admitting: Nurse Practitioner

## 2020-12-09 DIAGNOSIS — E0590 Thyrotoxicosis, unspecified without thyrotoxic crisis or storm: Secondary | ICD-10-CM | POA: Diagnosis not present

## 2020-12-09 DIAGNOSIS — M0589 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: Secondary | ICD-10-CM | POA: Diagnosis not present

## 2020-12-16 DIAGNOSIS — E0590 Thyrotoxicosis, unspecified without thyrotoxic crisis or storm: Secondary | ICD-10-CM | POA: Diagnosis not present

## 2020-12-16 DIAGNOSIS — I10 Essential (primary) hypertension: Secondary | ICD-10-CM | POA: Diagnosis not present

## 2020-12-16 DIAGNOSIS — E0520 Thyrotoxicosis with toxic multinodular goiter without thyrotoxic crisis or storm: Secondary | ICD-10-CM | POA: Diagnosis not present

## 2020-12-20 DIAGNOSIS — M0589 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: Secondary | ICD-10-CM | POA: Diagnosis not present

## 2020-12-20 DIAGNOSIS — N1831 Chronic kidney disease, stage 3a: Secondary | ICD-10-CM | POA: Diagnosis not present

## 2020-12-20 DIAGNOSIS — Z79899 Other long term (current) drug therapy: Secondary | ICD-10-CM | POA: Diagnosis not present

## 2020-12-20 DIAGNOSIS — E0590 Thyrotoxicosis, unspecified without thyrotoxic crisis or storm: Secondary | ICD-10-CM | POA: Diagnosis not present

## 2020-12-20 DIAGNOSIS — Z780 Asymptomatic menopausal state: Secondary | ICD-10-CM | POA: Diagnosis not present

## 2020-12-20 DIAGNOSIS — D72819 Decreased white blood cell count, unspecified: Secondary | ICD-10-CM | POA: Diagnosis not present

## 2020-12-20 DIAGNOSIS — M549 Dorsalgia, unspecified: Secondary | ICD-10-CM | POA: Diagnosis not present

## 2021-01-05 ENCOUNTER — Other Ambulatory Visit: Payer: Self-pay | Admitting: Nurse Practitioner

## 2021-01-05 ENCOUNTER — Encounter: Payer: Medicare PPO | Admitting: Nurse Practitioner

## 2021-01-05 ENCOUNTER — Ambulatory Visit (INDEPENDENT_AMBULATORY_CARE_PROVIDER_SITE_OTHER): Payer: Medicare PPO

## 2021-01-05 ENCOUNTER — Other Ambulatory Visit: Payer: Self-pay

## 2021-01-05 VITALS — BP 102/58 | HR 74 | Temp 98.2°F | Ht 64.0 in | Wt 130.8 lb

## 2021-01-05 DIAGNOSIS — I10 Essential (primary) hypertension: Secondary | ICD-10-CM | POA: Diagnosis not present

## 2021-01-05 DIAGNOSIS — Z0000 Encounter for general adult medical examination without abnormal findings: Secondary | ICD-10-CM

## 2021-01-05 DIAGNOSIS — R82998 Other abnormal findings in urine: Secondary | ICD-10-CM | POA: Diagnosis not present

## 2021-01-05 LAB — POCT URINALYSIS DIPSTICK
Bilirubin, UA: NEGATIVE
Glucose, UA: NEGATIVE
Ketones, UA: NEGATIVE
Nitrite, UA: NEGATIVE
Protein, UA: NEGATIVE
Spec Grav, UA: 1.015 (ref 1.010–1.025)
Urobilinogen, UA: 0.2 E.U./dL
pH, UA: 6.5 (ref 5.0–8.0)

## 2021-01-05 LAB — POCT UA - MICROALBUMIN
Creatinine, POC: 300 mg/dL
Microalbumin Ur, POC: 80 mg/L

## 2021-01-05 NOTE — Patient Instructions (Signed)
Priscilla Thornton , Thank you for taking time to come for your Medicare Wellness Visit. I appreciate your ongoing commitment to your health goals. Please review the following plan we discussed and let me know if I can assist you in the future.   Screening recommendations/referrals: Colonoscopy: decline Mammogram: decline Bone Density: completed 03/25/2018 Recommended yearly ophthalmology/optometry visit for glaucoma screening and checkup Recommended yearly dental visit for hygiene and checkup  Vaccinations: Influenza vaccine: decline Pneumococcal vaccine: decline Tdap vaccine: decline Shingles vaccine: decline   Covid-19: 05/31/2020, 10/28/2019, 09/25/2019  Advanced directives: Advance directive discussed with you today. Even though you declined this today please call our office should you change your mind and we can give you the proper paperwork for you to fill out.  Conditions/risks identified: none  Next appointment: Follow up in one year for your annual wellness visit    Preventive Care 65 Years and Older, Female Preventive care refers to lifestyle choices and visits with your health care provider that can promote health and wellness. What does preventive care include? A yearly physical exam. This is also called an annual well check. Dental exams once or twice a year. Routine eye exams. Ask your health care provider how often you should have your eyes checked. Personal lifestyle choices, including: Daily care of your teeth and gums. Regular physical activity. Eating a healthy diet. Avoiding tobacco and drug use. Limiting alcohol use. Practicing safe sex. Taking low-dose aspirin every day. Taking vitamin and mineral supplements as recommended by your health care provider. What happens during an annual well check? The services and screenings done by your health care provider during your annual well check will depend on your age, overall health, lifestyle risk factors, and family  history of disease. Counseling  Your health care provider may ask you questions about your: Alcohol use. Tobacco use. Drug use. Emotional well-being. Home and relationship well-being. Sexual activity. Eating habits. History of falls. Memory and ability to understand (cognition). Work and work Statistician. Reproductive health. Screening  You may have the following tests or measurements: Height, weight, and BMI. Blood pressure. Lipid and cholesterol levels. These may be checked every 5 years, or more frequently if you are over 22 years old. Skin check. Lung cancer screening. You may have this screening every year starting at age 11 if you have a 30-pack-year history of smoking and currently smoke or have quit within the past 15 years. Fecal occult blood test (FOBT) of the stool. You may have this test every year starting at age 18. Flexible sigmoidoscopy or colonoscopy. You may have a sigmoidoscopy every 5 years or a colonoscopy every 10 years starting at age 79. Hepatitis C blood test. Hepatitis B blood test. Sexually transmitted disease (STD) testing. Diabetes screening. This is done by checking your blood sugar (glucose) after you have not eaten for a while (fasting). You may have this done every 1-3 years. Bone density scan. This is done to screen for osteoporosis. You may have this done starting at age 51. Mammogram. This may be done every 1-2 years. Talk to your health care provider about how often you should have regular mammograms. Talk with your health care provider about your test results, treatment options, and if necessary, the need for more tests. Vaccines  Your health care provider may recommend certain vaccines, such as: Influenza vaccine. This is recommended every year. Tetanus, diphtheria, and acellular pertussis (Tdap, Td) vaccine. You may need a Td booster every 10 years. Zoster vaccine. You may need this after  age 63. Pneumococcal 13-valent conjugate (PCV13)  vaccine. One dose is recommended after age 58. Pneumococcal polysaccharide (PPSV23) vaccine. One dose is recommended after age 37. Talk to your health care provider about which screenings and vaccines you need and how often you need them. This information is not intended to replace advice given to you by your health care provider. Make sure you discuss any questions you have with your health care provider. Document Released: 07/23/2015 Document Revised: 03/15/2016 Document Reviewed: 04/27/2015 Elsevier Interactive Patient Education  2017 [place] Prevention in the Home Falls can cause injuries. They can happen to people of all ages. There are many things you can do to make your home safe and to help prevent falls. What can I do on the outside of my home? Regularly fix the edges of walkways and driveways and fix any cracks. Remove anything that might make you trip as you walk through a door, such as a raised step or threshold. Trim any bushes or trees on the path to your home. Use bright outdoor lighting. Clear any walking paths of anything that might make someone trip, such as rocks or tools. Regularly check to see if handrails are loose or broken. Make sure that both sides of any steps have handrails. Any raised decks and porches should have guardrails on the edges. Have any leaves, snow, or ice cleared regularly. Use sand or salt on walking paths during winter. Clean up any spills in your garage right away. This includes oil or grease spills. What can I do in the bathroom? Use night lights. Install grab bars by the toilet and in the tub and shower. Do not use towel bars as grab bars. Use non-skid mats or decals in the tub or shower. If you need to sit down in the shower, use a plastic, non-slip stool. Keep the floor dry. Clean up any water that spills on the floor as soon as it happens. Remove soap buildup in the tub or shower regularly. Attach bath mats securely with  double-sided non-slip rug tape. Do not have throw rugs and other things on the floor that can make you trip. What can I do in the bedroom? Use night lights. Make sure that you have a light by your bed that is easy to reach. Do not use any sheets or blankets that are too big for your bed. They should not hang down onto the floor. Have a firm chair that has side arms. You can use this for support while you get dressed. Do not have throw rugs and other things on the floor that can make you trip. What can I do in the kitchen? Clean up any spills right away. Avoid walking on wet floors. Keep items that you use a lot in easy-to-reach places. If you need to reach something above you, use a strong step stool that has a grab bar. Keep electrical cords out of the way. Do not use floor polish or wax that makes floors slippery. If you must use wax, use non-skid floor wax. Do not have throw rugs and other things on the floor that can make you trip. What can I do with my stairs? Do not leave any items on the stairs. Make sure that there are handrails on both sides of the stairs and use them. Fix handrails that are broken or loose. Make sure that handrails are as long as the stairways. Check any carpeting to make sure that it is firmly attached to the stairs.  Fix any carpet that is loose or worn. Avoid having throw rugs at the top or bottom of the stairs. If you do have throw rugs, attach them to the floor with carpet tape. Make sure that you have a light switch at the top of the stairs and the bottom of the stairs. If you do not have them, ask someone to add them for you. What else can I do to help prevent falls? Wear shoes that: Do not have high heels. Have rubber bottoms. Are comfortable and fit you well. Are closed at the toe. Do not wear sandals. If you use a stepladder: Make sure that it is fully opened. Do not climb a closed stepladder. Make sure that both sides of the stepladder are locked  into place. Ask someone to hold it for you, if possible. Clearly mark and make sure that you can see: Any grab bars or handrails. First and last steps. Where the edge of each step is. Use tools that help you move around (mobility aids) if they are needed. These include: Canes. Walkers. Scooters. Crutches. Turn on the lights when you go into a dark area. Replace any light bulbs as soon as they burn out. Set up your furniture so you have a clear path. Avoid moving your furniture around. If any of your floors are uneven, fix them. If there are any pets around you, be aware of where they are. Review your medicines with your doctor. Some medicines can make you feel dizzy. This can increase your chance of falling. Ask your doctor what other things that you can do to help prevent falls. This information is not intended to replace advice given to you by your health care provider. Make sure you discuss any questions you have with your health care provider. Document Released: 04/22/2009 Document Revised: 12/02/2015 Document Reviewed: 07/31/2014 Elsevier Interactive Patient Education  2017 [employer].

## 2021-01-05 NOTE — Addendum Note (Signed)
Addended by: Kellie Simmering on: 01/05/2021 11:34 AM   Modules accepted: Orders

## 2021-01-05 NOTE — Progress Notes (Signed)
This visit occurred during the SARS-CoV-2 public health emergency.  Safety protocols were in place, including screening questions prior to the visit, additional usage of staff PPE, and extensive cleaning of exam room while observing appropriate contact time as indicated for disinfecting solutions.  Subjective:   Priscilla Thornton.  Review of Systems     Cardiac Risk Factors include: advanced age (>44men, >46 women);hypertension;sedentary lifestyle     Objective:    Today's Vitals   01/05/21 0852  BP: (!) 102/58  Pulse: 74  Temp: 98.2 F (36.8 C)  TempSrc: Oral  SpO2: 98%  Weight: 130 lb 12.8 oz (59.3 kg)  Height: 5\' 4"  (1.626 m)   Body mass index is 22.45 kg/m.  Advanced Directives 01/05/2021 12/31/2019 12/19/2018  Does Patient Have a Medical Advance Directive? No No No  Would patient like information on creating a medical advance directive? No - Patient declined No - Patient declined No - Patient declined    Current Medications (verified) Outpatient Encounter Medications as of 01/05/2021  Medication Sig   aspirin 81 MG tablet Take 81 mg by mouth daily.   atorvastatin (LIPITOR) 20 MG tablet Take 1 tablet (20 mg total) by mouth daily.   fish oil-omega-3 fatty acids 1000 MG capsule Take 1 g by mouth daily.   folic acid (FOLVITE) 1 MG tablet Take 1 mg by mouth daily.   hydrochlorothiazide (HYDRODIURIL) 12.5 MG tablet Take 1 tablet (12.5 mg total) by mouth daily.   ibuprofen (ADVIL,MOTRIN) 200 MG tablet Take 200 mg by mouth every 6 (six) hours as needed.   KLOR-CON M10 10 MEQ tablet TAKE 1 TABLET BY MOUTH EVERY DAY WITH FOOD   leflunomide (ARAVA) 10 MG tablet Take 10 mg by mouth daily.   losartan (COZAAR) 100 MG tablet Take 1 tablet (100 mg total) by mouth daily.   methimazole (TAPAZOLE) 5 MG tablet Take 5 mg by mouth daily.   Multiple Vitamins-Calcium (ONE-A-DAY WOMENS FORMULA PO) Take 1  tablet by mouth daily.   methotrexate (50 MG/ML) 1 g injection Inject 1 mg into the vein once a week. (Patient not taking: Reported on 01/05/2021)   No facility-administered encounter medications on file as of 01/05/2021.    Allergies (verified) Patient has no known allergies.   History: Past Medical History:  Diagnosis Date   Arthritis    Hyperlipidemia    Hypertension    History reviewed. No pertinent surgical history. Family History  Problem Relation Age of Onset   Diabetes Mother    Heart disease Father    Colon cancer Neg Hx    Esophageal cancer Neg Hx    Rectal cancer Neg Hx    Stomach cancer Neg Hx    Social History   Socioeconomic History   Marital status: Single    Spouse name: Not on file   Number of children: Not on file   Years of education: Not on file   Highest education level: Not on file  Occupational History   Occupation: retired  Tobacco Use   Smoking status: Former    Packs/day: 0.20    Pack years: 0.00    Types: Cigarettes    Quit date: 2016    Years since quitting: 6.4   Smokeless tobacco: Never   Tobacco comments:    2-3 cigaretts a day  Vaping Use   Vaping Use: Never used  Substance and Sexual Activity   Alcohol use: No  Drug use: No   Sexual activity: Not Currently  Other Topics Concern   Not on file  Social History Narrative   Not on file   Social Determinants of Health   Financial Resource Strain: Low Risk    Difficulty of Paying Living Expenses: Not hard at all  Food Insecurity: No Food Insecurity   Worried About Running Out of Food in the Last Year: Never true   [place] in the Last Year: Never true  Transportation Needs: No Transportation Needs   Lack of Transportation (Medical): No   Lack of Transportation (Non-Medical): No  Physical Activity: Inactive   Days of Exercise per Week: 0 days   Minutes of Exercise per Session: 0 min  Stress: No Stress Concern Present   Feeling of Stress : Not at all  Social  Connections: Not on file    Tobacco Counseling Counseling given: Not Answered Tobacco comments: 2-3 cigaretts a day   Clinical Intake:  Pre-visit preparation completed: Yes  Pain : No/denies pain     Nutritional Status: BMI of 19-24  Normal Nutritional Risks: None Diabetes: No  How often do you need to have someone help you when you read instructions, pamphlets, or other written materials from your doctor or pharmacy?: 1 - Never What is the last grade level you completed in school?: 27yr college  Diabetic? no  Interpreter Needed?: No  Information entered by :: NAllen LPN   Activities of Daily Living In your present state of health, do you have any difficulty performing the following activities: 01/05/2021  Hearing? N  Vision? N  Difficulty concentrating or making decisions? Y  Comment sometimes  Walking or climbing stairs? N  Dressing or bathing? N  Doing errands, shopping? N  Preparing Food and eating ? N  Using the Toilet? N  In the past six months, have you accidently leaked urine? N  Do you have problems with loss of bowel control? N  Managing your Medications? N  Managing your Finances? N  Housekeeping or managing your Housekeeping? N  Some recent data might be hidden    Patient Care Team: [name], FNP as PCP - General ([place]) Jacelyn Pi, MD as Referring Physician (Endocrinology) Lahoma Rocker, MD as Consulting Physician (Rheumatology) [place], [place] any recent Medical Services you may have received from other than [facility] providers in the past year (date may be approximate).     Assessment:   This is a routine wellness Thornton for Priscilla Thornton.  Hearing/Vision screen Vision Screening - Comments:: Regular eye exams, Dr. Gershon Crane  Dietary issues and exercise activities discussed: Current Exercise Habits: The patient does not participate in regular exercise at present   Goals Addressed             This Visit's  Progress    Patient Stated       01/05/2021, no goals        Depression Screen PHQ 2/9 Scores 01/05/2021 12/31/2019 06/25/2019 12/19/2018 06/03/2018  PHQ - 2 Score 0 0 0 0 0  PHQ- 9 Score - 0 - 1 -    Fall Risk Fall Risk  01/05/2021 12/31/2019 06/25/2019 12/19/2018 06/03/2018  Falls in the past year? 0 0 0 0 0  Risk for fall due to : Medication side effect Medication side effect - Medication side effect -  Follow up Falls evaluation completed;Education provided;Falls prevention discussed Falls evaluation completed;Education provided;Falls prevention discussed - Falls prevention discussed;Education provided -    FALL  RISK PREVENTION PERTAINING TO THE HOME:  Any stairs in or around the home? Yes  If so, are there any without handrails? N/a Home free of loose throw rugs in walkways, pet beds, electrical cords, etc? Yes  Adequate lighting in your home to reduce risk of falls? Yes   ASSISTIVE DEVICES UTILIZED TO PREVENT FALLS:  Life alert? No  Use of a cane, walker or w/c? No  Grab bars in the bathroom? No  Shower chair or bench in shower? No  Elevated toilet seat or a handicapped toilet? No   TIMED UP AND GO:  Was the test performed? No .     Gait steady and fast without use of assistive device  Cognitive Function:     6CIT Screen 01/05/2021 12/31/2019 12/19/2018  What Year? 0 points 0 points 0 points  What month? 0 points 0 points 0 points  What time? 0 points 0 points 0 points  Count back from 20 0 points 0 points 0 points  Months in reverse 2 points 2 points 4 points  Repeat phrase 6 points 4 points 2 points  Total Score 8 6 6     Immunizations Immunization History  Administered Date(s) Administered   Moderna SARS-COV2 Booster Vaccination 05/31/2020   Moderna Sars-Covid-2 Vaccination 09/25/2019, 10/28/2019    TDAP status: Due, Education has been provided regarding the importance of this vaccine. Advised may receive this vaccine at local pharmacy or Health Dept. Aware  to provide a copy of the vaccination record if obtained from local pharmacy or Health Dept. Verbalized acceptance and understanding.  Flu Vaccine status: Declined, Education has been provided regarding the importance of this vaccine but patient still declined. Advised may receive this vaccine at local pharmacy or Health Dept. Aware to provide a copy of the vaccination record if obtained from local pharmacy or Health Dept. Verbalized acceptance and understanding.  Pneumococcal vaccine status: Declined,  Education has been provided regarding the importance of this vaccine but patient still declined. Advised may receive this vaccine at local pharmacy or Health Dept. Aware to provide a copy of the vaccination record if obtained from local pharmacy or Health Dept. Verbalized acceptance and understanding.   Covid-19 vaccine status: Completed vaccines  Qualifies for Shingles Vaccine? Yes   Zostavax completed No   Shingrix Completed?: No.    Education has been provided regarding the importance of this vaccine. Patient has been advised to call insurance company to determine out of pocket expense if they have not yet received this vaccine. Advised may also receive vaccine at local pharmacy or Health Dept. Verbalized acceptance and understanding.  Screening Tests Health Maintenance  Topic Date Due   COVID-19 Vaccine (4 - Booster for Moderna series) 08/31/2020   Zoster Vaccines- Shingrix (1 of 2) 04/07/2021 (Originally 09/18/1964)   MAMMOGRAM  01/05/2022 (Originally 08/12/2014)   COLONOSCOPY (Pts 45-44yrs Insurance coverage will need to be confirmed)  01/05/2022 (Originally 07/16/2017)   TETANUS/TDAP  01/05/2022 (Originally 09/18/1964)   PNA vac Low Risk Adult (1 of 2 - PCV13) 01/05/2022 (Originally 09/19/2011)   INFLUENZA VACCINE  02/07/2021   DEXA SCAN  Completed   Hepatitis C Screening  Completed   HPV VACCINES  Aged Out    Health Maintenance  Health Maintenance Due  Topic Date Due   COVID-19 Vaccine  (4 - Booster for Moderna series) 08/31/2020    Colorectal cancer screening: decline  Mammogram status: decline  Bone Density status: Completed 03/25/2018.   Lung Cancer Screening: (Low Dose CT Chest recommended if  Age 54-80 years, 30 pack-year currently smoking OR have quit w/in 15years.) does not qualify.   Lung Cancer Screening Referral: no  Additional Screening:  Hepatitis C Screening: does qualify; Completed 12/23/2018  Vision Screening: Recommended annual ophthalmology exams for early detection of glaucoma and other disorders of the eye. Is the patient up to date with their annual eye exam?  Yes  Who is the provider or what is the name of the office in which the patient attends annual eye exams? Dr. Gershon Crane If pt is not established with a provider, would they like to be referred to a provider to establish care? No .   Dental Screening: Recommended annual dental exams for proper oral hygiene  Community Resource Referral / Chronic Care Management: CRR required this visit?  No   CCM required this visit?  No      Plan:     I have personally reviewed and noted the following in the patient's chart:   Medical and social history Use of alcohol, tobacco or illicit drugs  Current medications and supplements including opioid prescriptions.  Functional ability and status Nutritional status Physical activity Advanced directives List of other physicians Hospitalizations, surgeries, and ER visits in previous 12 months Vitals Screenings to include cognitive, depression, and falls Referrals and appointments  In addition, I have reviewed and discussed with patient certain preventive protocols, quality metrics, and best practice recommendations. A written personalized care plan for preventive services as well as general preventive health recommendations were provided to patient.     Kellie Simmering, LPN   1/88/4166   Nurse Notes:

## 2021-01-06 DIAGNOSIS — H25813 Combined forms of age-related cataract, bilateral: Secondary | ICD-10-CM | POA: Diagnosis not present

## 2021-01-06 DIAGNOSIS — H524 Presbyopia: Secondary | ICD-10-CM | POA: Diagnosis not present

## 2021-01-06 DIAGNOSIS — H5203 Hypermetropia, bilateral: Secondary | ICD-10-CM | POA: Diagnosis not present

## 2021-01-09 LAB — URINE CULTURE

## 2021-01-11 ENCOUNTER — Other Ambulatory Visit: Payer: Self-pay | Admitting: Nurse Practitioner

## 2021-01-11 DIAGNOSIS — N390 Urinary tract infection, site not specified: Secondary | ICD-10-CM

## 2021-01-11 MED ORDER — NITROFURANTOIN MONOHYD MACRO 100 MG PO CAPS
100.0000 mg | ORAL_CAPSULE | Freq: Two times a day (BID) | ORAL | 0 refills | Status: AC
Start: 2021-01-11 — End: 2021-01-16

## 2021-01-17 DIAGNOSIS — I10 Essential (primary) hypertension: Secondary | ICD-10-CM | POA: Diagnosis not present

## 2021-02-21 ENCOUNTER — Other Ambulatory Visit: Payer: Self-pay

## 2021-02-21 DIAGNOSIS — I10 Essential (primary) hypertension: Secondary | ICD-10-CM

## 2021-02-21 MED ORDER — POTASSIUM CHLORIDE CRYS ER 10 MEQ PO TBCR: 10.0000 meq | EXTENDED_RELEASE_TABLET | Freq: Every day | ORAL | 1 refills | Status: DC

## 2021-03-22 DIAGNOSIS — Z79899 Other long term (current) drug therapy: Secondary | ICD-10-CM | POA: Diagnosis not present

## 2021-03-22 DIAGNOSIS — E0590 Thyrotoxicosis, unspecified without thyrotoxic crisis or storm: Secondary | ICD-10-CM | POA: Diagnosis not present

## 2021-03-22 DIAGNOSIS — M549 Dorsalgia, unspecified: Secondary | ICD-10-CM | POA: Diagnosis not present

## 2021-03-22 DIAGNOSIS — D72819 Decreased white blood cell count, unspecified: Secondary | ICD-10-CM | POA: Diagnosis not present

## 2021-03-22 DIAGNOSIS — M0589 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: Secondary | ICD-10-CM | POA: Diagnosis not present

## 2021-03-22 DIAGNOSIS — N1831 Chronic kidney disease, stage 3a: Secondary | ICD-10-CM | POA: Diagnosis not present

## 2021-03-23 LAB — COMPREHENSIVE METABOLIC PANEL
Albumin: 3.6 (ref 3.5–5.0)
Calcium: 10.1 (ref 8.7–10.7)
GFR calc Af Amer: 41
GFR calc non Af Amer: 36

## 2021-03-23 LAB — CBC AND DIFFERENTIAL
HCT: 33 — AB (ref 36–46)
Hemoglobin: 10.3 — AB (ref 12.0–16.0)
Neutrophils Absolute: 1.7
WBC: 3.3

## 2021-03-23 LAB — HEPATIC FUNCTION PANEL
ALT: 17 (ref 7–35)
AST: 21 (ref 13–35)
Alkaline Phosphatase: 57 (ref 25–125)
Bilirubin, Total: 0.3

## 2021-03-23 LAB — BASIC METABOLIC PANEL
BUN: 31 — AB (ref 4–21)
CO2: 26 — AB (ref 13–22)
Chloride: 102 (ref 99–108)
Glucose: 110
Potassium: 3.9 (ref 3.4–5.3)
Sodium: 140 (ref 137–147)

## 2021-03-23 LAB — CBC: RBC: 3.73 — AB (ref 3.87–5.11)

## 2021-03-29 ENCOUNTER — Encounter: Payer: Self-pay | Admitting: Nurse Practitioner

## 2021-04-04 ENCOUNTER — Ambulatory Visit (INDEPENDENT_AMBULATORY_CARE_PROVIDER_SITE_OTHER): Payer: Medicare PPO | Admitting: Nurse Practitioner

## 2021-04-04 ENCOUNTER — Encounter: Payer: Self-pay | Admitting: Nurse Practitioner

## 2021-04-04 ENCOUNTER — Other Ambulatory Visit: Payer: Self-pay

## 2021-04-04 VITALS — BP 120/72 | HR 81 | Temp 99.5°F | Ht 64.0 in | Wt 120.4 lb

## 2021-04-04 DIAGNOSIS — H6123 Impacted cerumen, bilateral: Secondary | ICD-10-CM

## 2021-04-04 DIAGNOSIS — I10 Essential (primary) hypertension: Secondary | ICD-10-CM

## 2021-04-04 DIAGNOSIS — D179 Benign lipomatous neoplasm, unspecified: Secondary | ICD-10-CM

## 2021-04-04 DIAGNOSIS — R634 Abnormal weight loss: Secondary | ICD-10-CM

## 2021-04-04 DIAGNOSIS — Z2821 Immunization not carried out because of patient refusal: Secondary | ICD-10-CM

## 2021-04-04 DIAGNOSIS — E782 Mixed hyperlipidemia: Secondary | ICD-10-CM | POA: Diagnosis not present

## 2021-04-04 DIAGNOSIS — Z0000 Encounter for general adult medical examination without abnormal findings: Secondary | ICD-10-CM

## 2021-04-04 DIAGNOSIS — E0590 Thyrotoxicosis, unspecified without thyrotoxic crisis or storm: Secondary | ICD-10-CM | POA: Diagnosis not present

## 2021-04-04 LAB — POCT URINALYSIS DIPSTICK
Bilirubin, UA: NEGATIVE
Blood, UA: NEGATIVE
Glucose, UA: NEGATIVE
Leukocytes, UA: NEGATIVE
Nitrite, UA: NEGATIVE
Protein, UA: POSITIVE — AB
Spec Grav, UA: 1.01 (ref 1.010–1.025)
Urobilinogen, UA: 0.2 E.U./dL
pH, UA: 5.5 (ref 5.0–8.0)

## 2021-04-04 LAB — POCT UA - MICROALBUMIN
Albumin/Creatinine Ratio, Urine, POC: 30
Creatinine, POC: 300 mg/dL
Microalbumin Ur, POC: 80 mg/L

## 2021-04-04 NOTE — Progress Notes (Signed)
I,Tianna Badgett,acting as a Education administrator for [employer], FNP.,have documented all relevant documentation on the behalf of Priscilla Brine, FNP,as directed by  Priscilla Brine, FNP while in the presence of Priscilla Thornton, [place].  This visit occurred during the SARS-CoV-2 public health emergency.  Safety protocols were in place, including screening questions prior to the visit, additional usage of staff PPE, and extensive cleaning of exam room while observing appropriate contact time as indicated for disinfecting solutions.  Subjective:     Patient ID: Priscilla Thornton , female    DOB: [birth year] , 75 y.o.   MRN: [id number]   Chief Complaint  Patient presents with   Annual Exam    HPI  Patient is here for physical exam. Her daughter lives in [place] and her sister Blanch Media lives locally.   Wt Readings from Last 3 Encounters: 04/04/21 : 120 lb 6.4 oz (54.6 kg) 01/05/21 : 130 lb 12.8 oz (59.3 kg) 09/27/20 : 136 lb 9.6 oz (62 kg)  She takes methimazole for her thyroid and is being followed by Dr. Chalmers Cater.    Hypertension This is a chronic problem. The current episode started more than 1 year ago. The problem is controlled. Pertinent negatives include no anxiety, chest pain, headaches, palpitations or shortness of breath. Risk factors for coronary artery disease include dyslipidemia and sedentary lifestyle. There are no compliance problems.  There is no history of kidney disease. There is no history of chronic renal disease.    Past Medical History:  Diagnosis Date   Arthritis    Hyperlipidemia    Hypertension      Family History  Problem Relation Age of Onset   Diabetes Mother    Heart disease Father    Colon cancer Neg Hx    Esophageal cancer Neg Hx    Rectal cancer Neg Hx    Stomach cancer Neg Hx      Current Outpatient Medications:    aspirin 81 MG tablet, Take 81 mg by mouth daily., Disp: , Rfl:    atorvastatin (LIPITOR) 20 MG tablet, Take 1 tablet (20 mg total)  by mouth daily., Disp: 90 tablet, Rfl: 1   fish oil-omega-3 fatty acids 1000 MG capsule, Take 1 g by mouth daily., Disp: , Rfl:    folic acid (FOLVITE) 1 MG tablet, Take 1 mg by mouth daily., Disp: , Rfl:    hydrochlorothiazide (HYDRODIURIL) 12.5 MG tablet, Take 1 tablet (12.5 mg total) by mouth daily., Disp: 90 tablet, Rfl: 1   ibuprofen (ADVIL,MOTRIN) 200 MG tablet, Take 200 mg by mouth every 6 (six) hours as needed., Disp: , Rfl:    leflunomide (ARAVA) 10 MG tablet, Take 10 mg by mouth daily., Disp: , Rfl:    losartan (COZAAR) 100 MG tablet, Take 1 tablet (100 mg total) by mouth daily., Disp: 90 tablet, Rfl: 1   methimazole (TAPAZOLE) 5 MG tablet, Take 5 mg by mouth daily., Disp: , Rfl:    Multiple Vitamins-Calcium (ONE-A-DAY WOMENS FORMULA PO), Take 1 tablet by mouth daily., Disp: , Rfl:    potassium chloride (KLOR-CON M10) 10 MEQ tablet, Take 1 tablet (10 mEq total) by mouth daily. with food, Disp: 90 tablet, Rfl: 1   No Known Allergies    The patient states she is post menopausal status.  No LMP recorded. Patient is postmenopausal.. No abnormal vaginal bleeding.  Negative for: breast discharge, breast lump(s), breast pain and breast self exam. Associated symptoms include abnormal vaginal bleeding. Pertinent negatives include abnormal bleeding (  hematology), anxiety, decreased libido, depression, difficulty falling sleep, dyspareunia, history of infertility, nocturia, sexual dysfunction, sleep disturbances, urinary incontinence, urinary urgency, vaginal discharge and vaginal itching. Diet regular. She is concerned about losing weight. She will drink ensure throughout the day. She is not eating like she used to.  The patient states her exercise level is minimal.   The patient's tobacco use is:  Social History   Tobacco Use  Smoking Status Former   Packs/day: 0.20   Types: Cigarettes   Quit date: 2016   Years since quitting: 6.7  Smokeless Tobacco Never  Tobacco Comments   2-3 cigaretts  a day   She has been exposed to passive smoke. The patient's alcohol use is:  Social History   Substance and Sexual Activity  Alcohol Use No     Review of Systems  Constitutional:  Positive for unexpected weight change.  HENT: Negative.    Eyes: Negative.   Respiratory: Negative.  Negative for shortness of breath.   Cardiovascular: Negative.  Negative for chest pain and palpitations.  Gastrointestinal: Negative.   Endocrine: Negative.   Genitourinary: Negative.   Musculoskeletal: Negative.   Skin: Negative.   Allergic/Immunologic: Negative.   Neurological: Negative.  Negative for headaches.  Hematological: Negative.   Psychiatric/Behavioral: Negative.      Today's Vitals   04/04/21 0846  BP: 120/72  Pulse: 81  Temp: 99.5 F (37.5 C)  TempSrc: Oral  Weight: 120 lb 6.4 oz (54.6 kg)  Height: 5' 4"  (1.626 m)   Body mass index is 20.67 kg/m.  Wt Readings from Last 3 Encounters:  04/04/21 120 lb 6.4 oz (54.6 kg)  01/05/21 130 lb 12.8 oz (59.3 kg)  09/27/20 136 lb 9.6 oz (62 kg)    Objective:  Physical Exam Vitals reviewed.  Constitutional:      General: She is not in acute distress.    Appearance: Normal appearance. She is well-developed.     Comments: Thin appearance  HENT:     Head: Normocephalic and atraumatic.     Right Ear: Hearing and external ear normal. There is impacted cerumen.     Left Ear: Hearing and external ear normal. There is impacted cerumen.     Ears:     Comments: Large amount of cotton in bilateral ear canals.     Nose:     Comments: Deferred - masked    Mouth/Throat:     Comments: Deferred - masked Eyes:     General: Lids are normal.     Conjunctiva/sclera: Conjunctivae normal.     Pupils: Pupils are equal, round, and reactive to light.     Funduscopic exam:    Right eye: No papilledema.        Left eye: No papilledema.  Neck:     Thyroid: No thyroid mass.     Vascular: No carotid bruit.  Cardiovascular:     Rate and Rhythm:  Normal rate and regular rhythm.     Pulses: Normal pulses.     Heart sounds: Normal heart sounds. No murmur heard. Pulmonary:     Effort: Pulmonary effort is normal. No respiratory distress.     Breath sounds: Normal breath sounds. No wheezing.  Abdominal:     General: Abdomen is flat. Bowel sounds are normal. There is no distension.     Palpations: Abdomen is soft.     Tenderness: There is no abdominal tenderness.  Musculoskeletal:        General: No swelling or tenderness. Normal range  of motion.     Cervical back: Full passive range of motion without pain, normal range of motion and neck supple.     Right lower leg: No edema.     Left lower leg: No edema.  Skin:    General: Skin is warm and dry.     Capillary Refill: Capillary refill takes less than 2 seconds.     Findings: No lesion.     Comments: Large soft tissue lump to right upper back  Neurological:     General: No focal deficit present.     Mental Status: She is alert and oriented to person, place, and time.     Cranial Nerves: No cranial nerve deficit.     Sensory: No sensory deficit.  Psychiatric:        Mood and Affect: Mood normal.        Behavior: Behavior normal.        Thought Content: Thought content normal.        Judgment: Judgment normal.        Assessment And Plan:     1. Health maintenance examination Behavior modifications discussed and diet history reviewed.   Pt will continue to exercise regularly and modify diet with low GI, plant based foods and decrease intake of processed foods.  Recommend intake of daily multivitamin, Vitamin D, and calcium.  Recommend mammogram and colonoscopy for preventive screenings, as well as recommend immunizations that include influenza, TDAP, and Shingles (Rx sent to pharmacy)  2. Essential hypertension B/P is controlled.  CMP ordered to check renal function.  The importance of regular exercise and dietary modification was stressed to the patient.  Stressed  importance of losing ten percent of her body weight to help with B/P control.  The weight loss would help with decreasing cardiac and cancer risk as well.  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 EKG looks different from previous, will check her thyroid levels before referring to Cardiology.  - POCT Urinalysis Dipstick (81002) - POCT UA - Microalbumin - EKG 12-Lead - CMP14+EGFR  3. Mixed hyperlipidemia Chronic, controlled Continue with current medications - CMP14+EGFR - Lipid panel  4. Hyperthyroidism She is taking methimazole and is being followed by Dr. Chalmers Cater.  Will check thyroid levels due to weight loss over the last 6 months - CMP14+EGFR  5. Influenza vaccination declined Patient declined influenza vaccination at this time. Patient is aware that influenza vaccine prevents illness in 70% of healthy people, and reduces hospitalizations to 30-70% in elderly. This vaccine is recommended annually. Pt is willing to accept risk associated with refusing vaccination.  6. Abnormal weight loss Comments: She has lost 16 lbs in 6 months. Will check thyroid levels if normal will refer to GI and check CXR - CBC - TSH - T4 - T3, free  7. Bilateral impacted cerumen Comments: Removed small amount of cotton from ear with lighted curette. Also did water lavage - Ear Lavage  8. Lipoma, unspecified site Continues to have large soft tissue mass to right posterior back No issues with the area.   Patient was given opportunity to ask questions. Patient verbalized understanding of the plan and was able to repeat key elements of the plan. All questions were answered to their satisfaction.   Priscilla Brine, FNP   I, Priscilla Brine, FNP, have reviewed all documentation for this visit. The documentation on 04/04/21 for the exam, diagnosis, procedures, and orders are all accurate and complete.  THE PATIENT IS ENCOURAGED  TO PRACTICE SOCIAL DISTANCING DUE TO THE COVID-19  PANDEMIC.

## 2021-04-04 NOTE — Patient Instructions (Signed)
Health Maintenance, Female Adopting a healthy lifestyle and getting preventive care are important in promoting health and wellness. Ask your health care provider about: The right schedule for you to have regular tests and exams. Things you can do on your own to prevent diseases and keep yourself healthy. What should I know about diet, weight, and exercise? Eat a healthy diet  Eat a diet that includes plenty of vegetables, fruits, low-fat dairy products, and lean protein. Do not eat a lot of foods that are high in solid fats, added sugars, or sodium. Maintain a healthy weight Body mass index (BMI) is used to identify weight problems. It estimates body fat based on height and weight. Your health care provider can help determine your BMI and help you achieve or maintain a healthy weight. Get regular exercise Get regular exercise. This is one of the most important things you can do for your health. Most adults should: Exercise for at least 150 minutes each week. The exercise should increase your heart rate and make you sweat (moderate-intensity exercise). Do strengthening exercises at least twice a week. This is in addition to the moderate-intensity exercise. Spend less time sitting. Even light physical activity can be beneficial. Watch cholesterol and blood lipids Have your blood tested for lipids and cholesterol at 74 years of age, then have this test every 5 years. Have your cholesterol levels checked more often if: Your lipid or cholesterol levels are high. You are older than 74 years of age. You are at high risk for heart disease. What should I know about cancer screening? Depending on your health history and family history, you may need to have cancer screening at various ages. This may include screening for: Breast cancer. Cervical cancer. Colorectal cancer. Skin cancer. Lung cancer. What should I know about heart disease, diabetes, and high blood pressure? Blood pressure and heart  disease High blood pressure causes heart disease and increases the risk of stroke. This is more likely to develop in people who have high blood pressure readings, are of African descent, or are overweight. Have your blood pressure checked: Every 3-5 years if you are 18-39 years of age. Every year if you are 40 years old or older. Diabetes Have regular diabetes screenings. This checks your fasting blood sugar level. Have the screening done: Once every three years after age 40 if you are at a normal weight and have a low risk for diabetes. More often and at a younger age if you are overweight or have a high risk for diabetes. What should I know about preventing infection? Hepatitis B If you have a higher risk for hepatitis B, you should be screened for this virus. Talk with your health care provider to find out if you are at risk for hepatitis B infection. Hepatitis C Testing is recommended for: Everyone born from 1945 through 1965. Anyone with known risk factors for hepatitis C. Sexually transmitted infections (STIs) Get screened for STIs, including gonorrhea and chlamydia, if: You are sexually active and are younger than 74 years of age. You are older than 74 years of age and your health care provider tells you that you are at risk for this type of infection. Your sexual activity has changed since you were last screened, and you are at increased risk for chlamydia or gonorrhea. Ask your health care provider if you are at risk. Ask your health care provider about whether you are at high risk for HIV. Your health care provider may recommend a prescription medicine   to help prevent HIV infection. If you choose to take medicine to prevent HIV, you should first get tested for HIV. You should then be tested every 3 months for as long as you are taking the medicine. Pregnancy If you are about to stop having your period (premenopausal) and you may become pregnant, seek counseling before you get  pregnant. Take 400 to 800 micrograms (mcg) of folic acid every day if you become pregnant. Ask for birth control (contraception) if you want to prevent pregnancy. Osteoporosis and menopause Osteoporosis is a disease in which the bones lose minerals and strength with aging. This can result in bone fractures. If you are 65 years old or older, or if you are at risk for osteoporosis and fractures, ask your health care provider if you should: Be screened for bone loss. Take a calcium or vitamin D supplement to lower your risk of fractures. Be given hormone replacement therapy (HRT) to treat symptoms of menopause. Follow these instructions at home: Lifestyle Do not use any products that contain nicotine or tobacco, such as cigarettes, e-cigarettes, and chewing tobacco. If you need help quitting, ask your health care provider. Do not use street drugs. Do not share needles. Ask your health care provider for help if you need support or information about quitting drugs. Alcohol use Do not drink alcohol if: Your health care provider tells you not to drink. You are pregnant, may be pregnant, or are planning to become pregnant. If you drink alcohol: Limit how much you use to 0-1 drink a day. Limit intake if you are breastfeeding. Be aware of how much alcohol is in your drink. In the [place] one drink equals one 12 oz bottle of beer (355 mL), one 5 oz glass of wine (148 mL), or one 1 oz glass of hard liquor (44 mL). General instructions Schedule regular health, dental, and eye exams. Stay current with your vaccines. Tell your health care provider if: You often feel depressed. You have ever been abused or do not feel safe at home. Summary Adopting a healthy lifestyle and getting preventive care are important in promoting health and wellness. Follow your health care provider's instructions about healthy diet, exercising, and getting tested or screened for diseases. Follow your health care provider's  instructions on monitoring your cholesterol and blood pressure. This information is not intended to replace advice given to you by your health care provider. Make sure you discuss any questions you have with your health care provider. Document Revised: 09/03/2020 Document Reviewed: 06/19/2018 Elsevier Patient Education  2022 Elsevier Inc.  

## 2021-04-05 LAB — CMP14+EGFR
ALT: 12 IU/L (ref 0–32)
AST: 27 IU/L (ref 0–40)
Albumin/Globulin Ratio: 1.5 (ref 1.2–2.2)
Albumin: 4.3 g/dL (ref 3.7–4.7)
Alkaline Phosphatase: 62 IU/L (ref 44–121)
BUN/Creatinine Ratio: 21 (ref 12–28)
BUN: 34 mg/dL — ABNORMAL HIGH (ref 8–27)
Bilirubin Total: 0.4 mg/dL (ref 0.0–1.2)
CO2: 22 mmol/L (ref 20–29)
Calcium: 10.7 mg/dL — ABNORMAL HIGH (ref 8.7–10.3)
Chloride: 102 mmol/L (ref 96–106)
Creatinine, Ser: 1.59 mg/dL — ABNORMAL HIGH (ref 0.57–1.00)
Globulin, Total: 2.9 g/dL (ref 1.5–4.5)
Glucose: 92 mg/dL (ref 70–99)
Potassium: 4.7 mmol/L (ref 3.5–5.2)
Sodium: 140 mmol/L (ref 134–144)
Total Protein: 7.2 g/dL (ref 6.0–8.5)
eGFR: 34 mL/min/{1.73_m2} — ABNORMAL LOW (ref >59)

## 2021-04-05 LAB — T4: T4, Total: 7.9 ug/dL (ref 4.5–12.0)

## 2021-04-05 LAB — LIPID PANEL
Chol/HDL Ratio: 3.7 ratio (ref 0.0–4.4)
Cholesterol, Total: 138 mg/dL (ref 100–199)
HDL: 37 mg/dL — ABNORMAL LOW (ref >39)
LDL Chol Calc (NIH): 78 mg/dL (ref 0–99)
Triglycerides: 127 mg/dL (ref 0–149)
VLDL Cholesterol Cal: 23 mg/dL (ref 5–40)

## 2021-04-05 LAB — CBC
Hematocrit: 32.2 % — ABNORMAL LOW (ref 34.0–46.6)
Hemoglobin: 10.3 g/dL — ABNORMAL LOW (ref 11.1–15.9)
MCH: 27.5 pg (ref 26.6–33.0)
MCHC: 32 g/dL (ref 31.5–35.7)
MCV: 86 fL (ref 79–97)
Platelets: 290 10*3/uL (ref 150–450)
RBC: 3.75 x10E6/uL — ABNORMAL LOW (ref 3.77–5.28)
RDW: 12.2 % (ref 11.7–15.4)
WBC: 4.8 10*3/uL (ref 3.4–10.8)

## 2021-04-05 LAB — TSH: TSH: 0.123 u[IU]/mL — ABNORMAL LOW (ref 0.450–4.500)

## 2021-04-05 LAB — T3, FREE: T3, Free: 3.1 pg/mL (ref 2.0–4.4)

## 2021-04-18 ENCOUNTER — Other Ambulatory Visit: Payer: Self-pay

## 2021-04-18 DIAGNOSIS — I10 Essential (primary) hypertension: Secondary | ICD-10-CM

## 2021-04-18 DIAGNOSIS — E782 Mixed hyperlipidemia: Secondary | ICD-10-CM

## 2021-04-18 MED ORDER — ATORVASTATIN CALCIUM 20 MG PO TABS: 20.0000 mg | ORAL_TABLET | Freq: Every day | ORAL | 1 refills | Status: DC

## 2021-04-18 MED ORDER — LOSARTAN POTASSIUM 100 MG PO TABS: 100.0000 mg | ORAL_TABLET | Freq: Every day | ORAL | 1 refills | Status: DC

## 2021-06-14 ENCOUNTER — Other Ambulatory Visit: Payer: Self-pay

## 2021-06-14 DIAGNOSIS — I10 Essential (primary) hypertension: Secondary | ICD-10-CM

## 2021-06-14 MED ORDER — HYDROCHLOROTHIAZIDE 12.5 MG PO TABS: 12.5000 mg | ORAL_TABLET | Freq: Every day | ORAL | 1 refills | Status: DC

## 2021-06-21 DIAGNOSIS — N1831 Chronic kidney disease, stage 3a: Secondary | ICD-10-CM | POA: Diagnosis not present

## 2021-06-21 DIAGNOSIS — E0520 Thyrotoxicosis with toxic multinodular goiter without thyrotoxic crisis or storm: Secondary | ICD-10-CM | POA: Diagnosis not present

## 2021-06-21 DIAGNOSIS — I10 Essential (primary) hypertension: Secondary | ICD-10-CM | POA: Diagnosis not present

## 2021-06-23 DIAGNOSIS — N1831 Chronic kidney disease, stage 3a: Secondary | ICD-10-CM | POA: Diagnosis not present

## 2021-06-23 DIAGNOSIS — Z79899 Other long term (current) drug therapy: Secondary | ICD-10-CM | POA: Diagnosis not present

## 2021-06-23 DIAGNOSIS — D72819 Decreased white blood cell count, unspecified: Secondary | ICD-10-CM | POA: Diagnosis not present

## 2021-06-23 DIAGNOSIS — E0590 Thyrotoxicosis, unspecified without thyrotoxic crisis or storm: Secondary | ICD-10-CM | POA: Diagnosis not present

## 2021-06-23 DIAGNOSIS — M0589 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: Secondary | ICD-10-CM | POA: Diagnosis not present

## 2021-06-23 DIAGNOSIS — M549 Dorsalgia, unspecified: Secondary | ICD-10-CM | POA: Diagnosis not present

## 2021-07-07 ENCOUNTER — Other Ambulatory Visit: Payer: Self-pay

## 2021-07-07 ENCOUNTER — Encounter: Payer: Self-pay | Admitting: Nurse Practitioner

## 2021-07-07 ENCOUNTER — Ambulatory Visit: Payer: Medicare PPO | Admitting: Nurse Practitioner

## 2021-07-07 VITALS — BP 102/70 | HR 83 | Temp 99.2°F | Ht 64.4 in | Wt 116.6 lb

## 2021-07-07 DIAGNOSIS — R222 Localized swelling, mass and lump, trunk: Secondary | ICD-10-CM

## 2021-07-07 DIAGNOSIS — R634 Abnormal weight loss: Secondary | ICD-10-CM

## 2021-07-07 DIAGNOSIS — E782 Mixed hyperlipidemia: Secondary | ICD-10-CM

## 2021-07-07 DIAGNOSIS — E0590 Thyrotoxicosis, unspecified without thyrotoxic crisis or storm: Secondary | ICD-10-CM | POA: Diagnosis not present

## 2021-07-07 DIAGNOSIS — Z2821 Immunization not carried out because of patient refusal: Secondary | ICD-10-CM | POA: Diagnosis not present

## 2021-07-07 DIAGNOSIS — R630 Anorexia: Secondary | ICD-10-CM | POA: Diagnosis not present

## 2021-07-07 DIAGNOSIS — I10 Essential (primary) hypertension: Secondary | ICD-10-CM | POA: Diagnosis not present

## 2021-07-07 NOTE — Progress Notes (Signed)
I,Priscilla Thornton,acting as a Education administrator for [employer], FNP.,have documented all relevant documentation on the behalf of Priscilla Brine, FNP,as directed by  Priscilla Brine, FNP while in the presence of Priscilla Thornton, [place].   This visit occurred during the SARS-CoV-2 public health emergency.  Safety protocols were in place, including screening questions prior to the visit, additional usage of staff PPE, and extensive cleaning of exam room while observing appropriate contact time as indicated for disinfecting solutions.  Subjective:     Patient ID: Priscilla Thornton , female    DOB: 07/20/1945 , 75 y.o.   MRN: [id number]   Chief Complaint  Patient presents with   Hypertension    HPI  Patient here for a blood pressure f/u. Her daughter has some concerns regarding her weight.  She reports not having an appetite. The patient does report  she has lost her appetite some. She is drinking Ensure with protein and premier protein.  Denies blood in her stool.  She was walking outside at [employer], slipped and fell with bruising on her face according to her daughter. She denies having any memory problems however her daughter reports she told her she had an appt yesterday but was for today.   Wt Readings from Last 3 Encounters: 07/07/21 : 116 lb 9.6 oz (52.9 kg) 04/04/21 : 120 lb 6.4 oz (54.6 kg) 01/05/21 : 130 lb 12.8 oz (59.3 kg)      Hypertension This is a chronic problem. The current episode started more than 1 year ago. The problem is unchanged. The problem is controlled. Pertinent negatives include no chest pain, headaches, palpitations or shortness of breath. There are no associated agents to hypertension. There are no known risk factors for coronary artery disease. Past treatments include angiotensin blockers. The current treatment provides moderate improvement. There are no compliance problems.  There is no history of angina, kidney disease or CVA. Identifiable causes of hypertension include a thyroid  problem. There is no history of chronic renal disease.    Past Medical History:  Diagnosis Date   Arthritis    Hyperlipidemia    Hypertension      Family History  Problem Relation Age of Onset   Diabetes Mother    Heart disease Father    Colon cancer Neg Hx    Esophageal cancer Neg Hx    Rectal cancer Neg Hx    Stomach cancer Neg Hx      Current Outpatient Medications:    aspirin 81 MG tablet, Take 81 mg by mouth daily., Disp: , Rfl:    atorvastatin (LIPITOR) 20 MG tablet, Take 1 tablet (20 mg total) by mouth daily., Disp: 90 tablet, Rfl: 1   fish oil-omega-3 fatty acids 1000 MG capsule, Take 1 g by mouth daily., Disp: , Rfl:    folic acid (FOLVITE) 1 MG tablet, Take 1 mg by mouth daily., Disp: , Rfl:    hydrochlorothiazide (HYDRODIURIL) 12.5 MG tablet, Take 1 tablet (12.5 mg total) by mouth daily., Disp: 90 tablet, Rfl: 1   ibuprofen (ADVIL,MOTRIN) 200 MG tablet, Take 200 mg by mouth every 6 (six) hours as needed., Disp: , Rfl:    leflunomide (ARAVA) 10 MG tablet, Take 10 mg by mouth daily., Disp: , Rfl:    losartan (COZAAR) 100 MG tablet, Take 1 tablet (100 mg total) by mouth daily., Disp: 90 tablet, Rfl: 1   methimazole (TAPAZOLE) 5 MG tablet, Take 5 mg by mouth daily., Disp: , Rfl:    Multiple Vitamins-Calcium (ONE-A-DAY  WOMENS FORMULA PO), Take 1 tablet by mouth daily., Disp: , Rfl:    potassium chloride (KLOR-CON M10) 10 MEQ tablet, Take 1 tablet (10 mEq total) by mouth daily. with food, Disp: 90 tablet, Rfl: 1   No Known Allergies   Review of Systems  Constitutional: Negative.   Respiratory: Negative.  Negative for shortness of breath.   Cardiovascular: Negative.  Negative for chest pain and palpitations.  Neurological: Negative.  Negative for headaches.  Psychiatric/Behavioral: Negative.      Today's Vitals   07/07/21 1054  BP: 102/70  Pulse: 83  Temp: 99.2 F (37.3 C)  Weight: 116 lb 9.6 oz (52.9 kg)  Height: 5' 4.4" (1.636 m)  PainSc: 0-No pain   Body  mass index is 19.77 kg/m.   Objective:  Physical Exam Vitals reviewed.  Constitutional:      General: She is not in acute distress.    Appearance: Normal appearance.  Cardiovascular:     Rate and Rhythm: Normal rate and regular rhythm.     Pulses: Normal pulses.     Heart sounds: Normal heart sounds. No murmur heard. Pulmonary:     Effort: Pulmonary effort is normal. No respiratory distress.     Breath sounds: Normal breath sounds. No wheezing.  Musculoskeletal:        General: Normal range of motion.     Cervical back: Normal range of motion and neck supple.  Skin:    General: Skin is warm and dry.     Capillary Refill: Capillary refill takes less than 2 seconds.  Neurological:     General: No focal deficit present.     Mental Status: She is alert and oriented to person, place, and time.     Cranial Nerves: No cranial nerve deficit.     Motor: No weakness.  Psychiatric:        Mood and Affect: Mood normal.        Behavior: Behavior normal.        Thought Content: Thought content normal.        Judgment: Judgment normal.        Assessment And Plan:     1. Essential hypertension Comments: Blood pressure is well controlled, continue current medications - CMP14+EGFR  2. Decreased appetite Comments: She has had a continuous decline in her appetite and feels like she is losing weight as well. - Ambulatory referral to Gastroenterology - Iron, TIBC and Ferritin Panel - CBC - Vitamin B12  3. Abnormal weight loss Wt Readings from Last 3 Encounters:  07/07/21 116 lb 9.6 oz (52.9 kg)  04/04/21 120 lb 6.4 oz (54.6 kg)  01/05/21 130 lb 12.8 oz (59.3 kg)   She has lost approximately 15 lbs in the last 6 months. Long discussion with her daughter and patient about her weight loss and to be sure to follow up with GI due to the weight loss.   - Ambulatory referral to Gastroenterology - Hemoglobin A1c - Vitamin B12  4. Mass of skin of back Comments: 10 cm x 9 cm, the size is  similar to her last measurement about one year ago. will check ultrasound to determine the mass.  - [place] CHEST SOFT TISSUE; Future  5. Hyperthyroidism Comments: Her levels could be some of the cause of her weight loss, she is to follow up with Endocrinology - TSH - T3, free - T4  6. Mixed hyperlipidemia Comments: Good control, continue current medications  7. Pneumococcal vaccination declined  8. Herpes zoster  vaccination declined    Patient was given opportunity to ask questions. Patient verbalized understanding of the plan and was able to repeat key elements of the plan. All questions were answered to their satisfaction.  Priscilla Brine, FNP   I, Priscilla Brine, FNP, have reviewed all documentation for this visit. The documentation on 07/18/21 for the exam, diagnosis, procedures, and orders are all accurate and complete.   IF YOU HAVE BEEN REFERRED TO A SPECIALIST, IT MAY TAKE 1-2 WEEKS TO SCHEDULE/PROCESS THE REFERRAL. IF YOU HAVE NOT HEARD FROM US/SPECIALIST IN TWO WEEKS, PLEASE GIVE [place] A CALL AT [phone].   THE PATIENT IS ENCOURAGED TO PRACTICE SOCIAL DISTANCING DUE TO THE COVID-19 PANDEMIC.

## 2021-07-07 NOTE — Patient Instructions (Signed)

## 2021-07-08 LAB — CMP14+EGFR
ALT: 9 IU/L (ref 0–32)
AST: 22 IU/L (ref 0–40)
Albumin/Globulin Ratio: 1.5 (ref 1.2–2.2)
Albumin: 4.2 g/dL (ref 3.7–4.7)
Alkaline Phosphatase: 53 IU/L (ref 44–121)
BUN/Creatinine Ratio: 13 (ref 12–28)
BUN: 14 mg/dL (ref 8–27)
Bilirubin Total: 0.4 mg/dL (ref 0.0–1.2)
CO2: 24 mmol/L (ref 20–29)
Calcium: 10.2 mg/dL (ref 8.7–10.3)
Chloride: 106 mmol/L (ref 96–106)
Creatinine, Ser: 1.1 mg/dL — ABNORMAL HIGH (ref 0.57–1.00)
Globulin, Total: 2.8 g/dL (ref 1.5–4.5)
Glucose: 81 mg/dL (ref 70–99)
Potassium: 4.3 mmol/L (ref 3.5–5.2)
Sodium: 146 mmol/L — ABNORMAL HIGH (ref 134–144)
Total Protein: 7 g/dL (ref 6.0–8.5)
eGFR: 53 mL/min/{1.73_m2} — ABNORMAL LOW (ref >59)

## 2021-07-08 LAB — TSH: TSH: 0.107 u[IU]/mL — ABNORMAL LOW (ref 0.450–4.500)

## 2021-07-08 LAB — CBC
Hematocrit: 30.9 % — ABNORMAL LOW (ref 34.0–46.6)
Hemoglobin: 9.8 g/dL — ABNORMAL LOW (ref 11.1–15.9)
MCH: 26.9 pg (ref 26.6–33.0)
MCHC: 31.7 g/dL (ref 31.5–35.7)
MCV: 85 fL (ref 79–97)
Platelets: 322 10*3/uL (ref 150–450)
RBC: 3.64 x10E6/uL — ABNORMAL LOW (ref 3.77–5.28)
RDW: 12.7 % (ref 11.7–15.4)
WBC: 2.8 10*3/uL — ABNORMAL LOW (ref 3.4–10.8)

## 2021-07-08 LAB — T4: T4, Total: 7.4 ug/dL (ref 4.5–12.0)

## 2021-07-08 LAB — HEMOGLOBIN A1C
Est. average glucose Bld gHb Est-mCnc: 103 mg/dL
Hgb A1c MFr Bld: 5.2 % (ref 4.8–5.6)

## 2021-07-08 LAB — IRON,TIBC AND FERRITIN PANEL
Ferritin: 706 ng/mL — ABNORMAL HIGH (ref 15–150)
Iron Saturation: 21 % (ref 15–55)
Iron: 63 ug/dL (ref 27–139)
Total Iron Binding Capacity: 298 ug/dL (ref 250–450)
UIBC: 235 ug/dL (ref 118–369)

## 2021-07-08 LAB — VITAMIN B12: Vitamin B-12: 775 pg/mL (ref 232–1245)

## 2021-07-08 LAB — T3, FREE: T3, Free: 3.3 pg/mL (ref 2.0–4.4)

## 2021-08-01 ENCOUNTER — Encounter: Payer: Self-pay | Admitting: Gastroenterology

## 2021-08-16 ENCOUNTER — Other Ambulatory Visit: Payer: Self-pay

## 2021-08-16 DIAGNOSIS — I10 Essential (primary) hypertension: Secondary | ICD-10-CM

## 2021-08-16 MED ORDER — POTASSIUM CHLORIDE CRYS ER 10 MEQ PO TBCR: 10.0000 meq | EXTENDED_RELEASE_TABLET | Freq: Every day | ORAL | 1 refills | Status: DC

## 2021-08-23 ENCOUNTER — Encounter: Payer: Self-pay | Admitting: Nurse Practitioner

## 2021-08-23 ENCOUNTER — Ambulatory Visit: Payer: Medicare PPO | Admitting: Nurse Practitioner

## 2021-08-23 ENCOUNTER — Other Ambulatory Visit: Payer: Self-pay

## 2021-08-23 VITALS — BP 108/80 | HR 77 | Temp 98.4°F | Ht 64.0 in | Wt 121.8 lb

## 2021-08-23 DIAGNOSIS — R630 Anorexia: Secondary | ICD-10-CM

## 2021-08-23 DIAGNOSIS — E0590 Thyrotoxicosis, unspecified without thyrotoxic crisis or storm: Secondary | ICD-10-CM | POA: Diagnosis not present

## 2021-08-23 NOTE — Progress Notes (Signed)
I,Victoria T Hamilton,acting as a Education administrator for [name], FNP.,have documented all relevant documentation on the behalf of [name], FNP,as directed by  [name], FNP while in the presence of [name], [place].  This visit occurred during the SARS-CoV-2 public health emergency.  Safety protocols were in place, including screening questions prior to the visit, additional usage of staff PPE, and extensive cleaning of exam room while observing appropriate contact time as indicated for disinfecting solutions.  Subjective:     Patient ID: Priscilla Thornton , female    DOB: 06/22/1946 , 76 y.o.   MRN: [id number]   Chief Complaint  Patient presents with   Weight Loss          HPI  Pt presents today for weight loss. She reports noticing the change last year , same time appetite lost. She only eats once a day, "if that". Ensures, she drinks everyday.  She is going for her colonoscopy on 09/02/2021.   Wt Readings from Last 3 Encounters: 08/23/21 : 121 lb 12.8 oz (55.2 kg) 07/07/21 : 116 lb 9.6 oz (52.9 kg) 04/04/21 : 120 lb 6.4 oz (54.6 kg)  She has seen Dr. Chalmers Cater who also felt she needed to be evaluated by GI but also has hyperthyroid. Taking     Past Medical History:  Diagnosis Date   Arthritis    Hyperlipidemia    Hypertension      Family History  Problem Relation Age of Onset   Diabetes Mother    Heart disease Father    Colon cancer Neg Hx    Esophageal cancer Neg Hx    Rectal cancer Neg Hx    Stomach cancer Neg Hx      Current Outpatient Medications:    aspirin 81 MG tablet, Take 81 mg by mouth daily., Disp: , Rfl:    atorvastatin (LIPITOR) 20 MG tablet, Take 1 tablet (20 mg total) by mouth daily., Disp: 90 tablet, Rfl: 1   fish oil-omega-3 fatty acids 1000 MG capsule, Take 1 g by mouth daily., Disp: , Rfl:    folic acid (FOLVITE) 1 MG tablet, Take 1 mg by mouth daily., Disp: , Rfl:    hydrochlorothiazide (HYDRODIURIL) 12.5 MG tablet, Take 1 tablet (12.5 mg total)  by mouth daily., Disp: 90 tablet, Rfl: 1   ibuprofen (ADVIL,MOTRIN) 200 MG tablet, Take 200 mg by mouth every 6 (six) hours as needed., Disp: , Rfl:    leflunomide (ARAVA) 10 MG tablet, Take 10 mg by mouth daily., Disp: , Rfl:    losartan (COZAAR) 100 MG tablet, Take 1 tablet (100 mg total) by mouth daily., Disp: 90 tablet, Rfl: 1   methimazole (TAPAZOLE) 5 MG tablet, Take 5 mg by mouth daily., Disp: , Rfl:    Multiple Vitamins-Calcium (ONE-A-DAY WOMENS FORMULA PO), Take 1 tablet by mouth daily., Disp: , Rfl:    potassium chloride (KLOR-CON M10) 10 MEQ tablet, Take 1 tablet (10 mEq total) by mouth daily. with food, Disp: 90 tablet, Rfl: 1   No Known Allergies   Review of Systems  Constitutional: Negative.   Respiratory: Negative.    Cardiovascular: Negative.  Negative for chest pain and leg swelling.  Neurological: Negative.   Psychiatric/Behavioral: Negative.      Today's Vitals   08/23/21 1539  BP: 108/80  Pulse: 77  Temp: 98.4 F (36.9 C)  Weight: 121 lb 12.8 oz (55.2 kg)  Height: 5\' 4"  (1.626 m)   Body mass index is 20.91 kg/m.  Wt  Readings from Last 3 Encounters:  08/23/21 121 lb 12.8 oz (55.2 kg)  07/07/21 116 lb 9.6 oz (52.9 kg)  04/04/21 120 lb 6.4 oz (54.6 kg)    Objective:  Physical Exam Vitals reviewed.  Constitutional:      General: She is not in acute distress.    Appearance: Normal appearance.  Cardiovascular:     Rate and Rhythm: Normal rate and regular rhythm.     Pulses: Normal pulses.     Heart sounds: Normal heart sounds. No murmur heard. Neurological:     Mental Status: She is alert.        Assessment And Plan:     1. Decreased appetite Comments: She is scheduled for her colonoscopy later this month, weight is up 3 lbs. Continue with protein shakes  2. Hyperthyroidism Comments: Continue follow up with Dr. Chalmers Cater, this could have an effect on her weight loss. Will be doing a colonoscopy this month     Patient was given opportunity to ask  questions. Patient verbalized understanding of the plan and was able to repeat key elements of the plan. All questions were answered to their satisfaction.  [name], FNP   I, [name], FNP, have reviewed all documentation for this visit. The documentation on 08/23/21 for the exam, diagnosis, procedures, and orders are all accurate and complete.   IF YOU HAVE BEEN REFERRED TO A SPECIALIST, IT MAY TAKE 1-2 WEEKS TO SCHEDULE/PROCESS THE REFERRAL. IF YOU HAVE NOT HEARD FROM US/SPECIALIST IN TWO WEEKS, PLEASE GIVE [place] A CALL AT [phone].   THE PATIENT IS ENCOURAGED TO PRACTICE SOCIAL DISTANCING DUE TO THE COVID-19 PANDEMIC.

## 2021-08-25 DIAGNOSIS — E0520 Thyrotoxicosis with toxic multinodular goiter without thyrotoxic crisis or storm: Secondary | ICD-10-CM | POA: Diagnosis not present

## 2021-09-02 ENCOUNTER — Encounter: Payer: Self-pay | Admitting: Gastroenterology

## 2021-09-02 ENCOUNTER — Other Ambulatory Visit: Payer: Medicare PPO

## 2021-09-02 ENCOUNTER — Ambulatory Visit: Payer: Medicare PPO | Admitting: Gastroenterology

## 2021-09-02 VITALS — BP 120/80 | HR 68

## 2021-09-02 DIAGNOSIS — R634 Abnormal weight loss: Secondary | ICD-10-CM

## 2021-09-02 DIAGNOSIS — Z86010 Personal history of colonic polyps: Secondary | ICD-10-CM

## 2021-09-02 MED ORDER — PLENVU 140 G PO SOLR: 140.0000 g | ORAL | 0 refills | Status: DC

## 2021-09-02 NOTE — Progress Notes (Signed)
[facility] Consult Note:  History: Priscilla Thornton 09/02/2021  Referring provider: [name], [place]  Reason for consult/chief complaint: Weight Loss (January 21 started loosing weight but this January starting putting weight back on. Had no appetite but it has come back. )   Subjective  HPI:  Priscilla Thornton was seen with her daughter today to discuss a colonoscopy and also for concerns of poor appetite.  Last colonoscopy by Dr. Deatra Ina January 2014: Diverticulosis and diminutive right colon tubular adenoma removed.  From PCP visit note 08/23/2021: "1. Decreased appetite Comments: She is scheduled for her colonoscopy later this month, weight is up 3 lbs. Continue with protein shakes   2. Hyperthyroidism Comments: Continue follow up with Dr. Chalmers Cater, this could have an effect on her weight loss. Will be doing a colonoscopy this month" ______________  Priscilla Thornton reports that about January 2021 she generally lost her appetite and had steady but slow weight loss over the next 2 years.  She was under care for hyperthyroidism even before that and it was apparently felt that at least some of the symptoms may be related to that.  Her appetite seems to have come back within the last couple of months and she has put back on a few pounds.  This has occurred despite any other obvious changes in her medicines or lifestyle. She denies chronic digestive symptoms such as abdominal pain, diarrhea, constipation, rectal bleeding, heartburn, dysphagia, nausea, vomiting, or early satiety.  ROS:  Review of Systems  Constitutional:  Negative for appetite change and unexpected weight change.  HENT:  Negative for mouth sores and voice change.   Eyes:  Negative for pain and redness.  Respiratory:  Negative for cough and shortness of breath.   Cardiovascular:  Negative for chest pain and palpitations.  Genitourinary:  Negative for dysuria and hematuria.  Musculoskeletal:  Negative for arthralgias  and myalgias.  Skin:  Negative for pallor and rash.  Neurological:  Negative for weakness and headaches.  Hematological:  Negative for adenopathy.    Past Medical History: Past Medical History:  Diagnosis Date   Arthritis    Hyperlipidemia    Hypertension    Rheumatoid arthritis-patient appears to be followed by Dr. Kathlene November based on epic encounters, but those notes cannot be accessed. Patient is followed by Dr. Chalmers Cater for hyperthyroidism  Past Surgical History: History reviewed. No pertinent surgical history.   Family History: Family History  Problem Relation Age of Onset   Diabetes Mother    Heart disease Father    Colon cancer Neg Hx    Esophageal cancer Neg Hx    Rectal cancer Neg Hx    Stomach cancer Neg Hx     Social History: Social History   Socioeconomic History   Marital status: Single    Spouse name: Not on file   Number of children: Not on file   Years of education: Not on file   Highest education level: Not on file  Occupational History   Occupation: retired  Tobacco Use   Smoking status: Former    Packs/day: 0.20    Types: Cigarettes    Quit date: 2016    Years since quitting: 7.1   Smokeless tobacco: Never   Tobacco comments:    2-3 cigaretts a day  Vaping Use   Vaping Use: Never used  Substance and Sexual Activity   Alcohol use: No   Drug use: No   Sexual activity: Not Currently  Other Topics Concern   Not on  file  Social History Narrative   Not on file   Social Determinants of Health   Financial Resource Strain: Low Risk    Difficulty of Paying Living Expenses: Not hard at all  Food Insecurity: No Food Insecurity   Worried About Charity fundraiser in the Last Year: Never true   Arboriculturist in the Last Year: Never true  Transportation Needs: No Transportation Needs   Lack of Transportation (Medical): No   Lack of Transportation (Non-Medical): No  Physical Activity: Inactive   Days of Exercise per Week: 0 days   Minutes of  Exercise per Session: 0 min  Stress: No Stress Concern Present   Feeling of Stress : Not at all  Social Connections: Not on file    Allergies: No Known Allergies  Outpatient Meds: Current Outpatient Medications  Medication Sig Dispense Refill   acetaminophen (TYLENOL) 500 MG tablet Take 500 mg by mouth every 6 (six) hours as needed.     aspirin 81 MG tablet Take 81 mg by mouth daily.     atorvastatin (LIPITOR) 20 MG tablet Take 1 tablet (20 mg total) by mouth daily. 90 tablet 1   fish oil-omega-3 fatty acids 1000 MG capsule Take 1 g by mouth daily.     folic acid (FOLVITE) 1 MG tablet Take 1 mg by mouth daily.     hydrochlorothiazide (HYDRODIURIL) 12.5 MG tablet Take 1 tablet (12.5 mg total) by mouth daily. 90 tablet 1   leflunomide (ARAVA) 10 MG tablet Take 10 mg by mouth daily.     losartan (COZAAR) 100 MG tablet Take 1 tablet (100 mg total) by mouth daily. 90 tablet 1   methimazole (TAPAZOLE) 5 MG tablet Take 5 mg by mouth daily.     Multiple Vitamins-Calcium (ONE-A-DAY WOMENS FORMULA PO) Take 1 tablet by mouth daily.     PEG-KCl-NaCl-NaSulf-Na Asc-C (PLENVU) 140 g SOLR Take 140 g by mouth as directed. Manufacturer's coupon Universal coupon code:BIN: P2366821; GROUP: [id number]; PCN: [id number]; ID: [id number]; PAY NO MORE $50 1 each 0   potassium chloride (KLOR-CON M10) 10 MEQ tablet Take 1 tablet (10 mEq total) by mouth daily. with food 90 tablet 1   No current facility-administered medications for this visit.      ___________________________________________________________________ Objective   Exam:  BP 120/80 (BP Location: Left Arm, Patient Position: Sitting, Cuff Size: Normal)    Pulse 68    SpO2 98%  Wt Readings from Last 3 Encounters:  08/23/21 121 lb 12.8 oz (55.2 kg)  07/07/21 116 lb 9.6 oz (52.9 kg)  04/04/21 120 lb 6.4 oz (54.6 kg)   Weight is noted to be up 5 pounds in the last 2 months.  General: Petite, thin, well-appearing.  Her daughter was present for the entire  visit. Eyes: sclera anicteric, no redness ENT: oral mucosa moist without lesions, no cervical or supraclavicular lymphadenopathy CV: RRR without murmur, S1/S2, no JVD, no peripheral edema Resp: clear to auscultation bilaterally, normal RR and effort noted GI: soft, no tenderness, with active bowel sounds. No guarding or palpable organomegaly noted. Skin; warm and dry, no rash or jaundice noted Neuro: awake, alert and oriented x 3. Normal gross motor function and fluent speech  Labs:  CBC Latest Ref Rng & Units 07/07/2021 04/04/2021 03/23/2021  WBC 3.4 - 10.8 x10E3/uL 2.8(L) 4.8 3.3  Hemoglobin 11.1 - 15.9 g/dL 9.8(L) 10.3(L) 10.3(A)  Hematocrit 34.0 - 46.6 % 30.9(L) 32.2(L) 33(A)  Platelets 150 - 450 x10E3/uL 322 290 -  CMP Latest Ref Rng & Units 07/07/2021 04/04/2021 03/23/2021  Glucose 70 - 99 mg/dL 81 92 -  BUN 8 - 27 mg/dL 14 34(H) 31(A)  Creatinine 0.57 - 1.00 mg/dL 1.10(H) 1.59(H) -  Sodium 134 - 144 mmol/L 146(H) 140 140  Potassium 3.5 - 5.2 mmol/L 4.3 4.7 3.9  Chloride 96 - 106 mmol/L 106 102 102  CO2 20 - 29 mmol/L 24 22 26(A)  Calcium 8.7 - 10.3 mg/dL 10.2 10.7(H) 10.1  Total Protein 6.0 - 8.5 g/dL 7.0 7.2 -  Total Bilirubin 0.0 - 1.2 mg/dL 0.4 0.4 -  Alkaline Phos 44 - 121 IU/L 53 62 57  AST 0 - 40 IU/L 22 27 21   ALT 0 - 32 IU/L 9 12 17    Leukopenia and anemia dating back to at least June 2021  Iron/TIBC/Ferritin/ %Sat    Component Value Date/Time   IRON 63 07/07/2021 1158   TIBC 298 07/07/2021 1158   FERRITIN 706 (H) 07/07/2021 1158   IRONPCTSAT 21 07/07/2021 1158    On 07/07/2021, TSH low at 0.107, free T3 and T4 normal   Assessment: Encounter Diagnoses  Name Primary?   Hx of colonic polyps Yes   Loss of weight     Loss of appetite and weight without any chronic digestive symptoms.  This seems more likely to been related to her thyroid condition and perhaps medicine for that or her rheumatoid arthritis then from a primary digestive cause. I am encouraged  that appetite recently improved and she has put on some weight. We discussed that she has 2 autoimmune conditions (rheumatoid and thyroid), and so additional autoimmune conditions should be considered, most notably celiac sprue.  She is also overdue for a surveillance colonoscopy due to history of adenomatous colon polyp in 2014.  Leukopenia and anemia, chronic and possibly due to her Finger.  Though I cannot read the office notes from her outside consultants, encounter titles indicate various visits or testing for leukopenia.  Plan:  IgA level and TTG antibody today  Surveillance colonoscopy.  She was agreeable after discussion of procedure and risks.  The benefits and risks of the planned procedure were described in detail with the patient or (when appropriate) their health care proxy.  Risks were outlined as including, but not limited to, bleeding, infection, perforation, adverse medication reaction leading to cardiac or pulmonary decompensation, pancreatitis (if ERCP).  The limitation of incomplete mucosal visualization was also discussed.  No guarantees or warranties were given.   Thank you for the courtesy of this consult.  Please call me with any questions or concerns.  Nelida Meuse III  CC: Referring provider noted above

## 2021-09-02 NOTE — Patient Instructions (Signed)
If you are age 75 or older, your body mass index should be between 23-30. Your There is no height or weight on file to calculate BMI. If this is out of the aforementioned range listed, please consider follow up with your Primary Care Provider.  If you are age 61 or younger, your body mass index should be between 19-25. Your There is no height or weight on file to calculate BMI. If this is out of the aformentioned range listed, please consider follow up with your Primary Care Provider.   ________________________________________________________  The [facility] providers would like to encourage you to use [facility] to communicate with providers for non-urgent requests or questions.  Due to long hold times on the telephone, sending your provider a message by [facility] may be a faster and more efficient way to get a response.  Please allow 48 business hours for a response.  Please remember that this is for non-urgent requests.  _______________________________________________________  Dennis Bast have been scheduled for a colonoscopy. Please follow written instructions given to you at your visit today.  Please pick up your prep supplies at the pharmacy within the next 1-3 days. If you use inhalers (even only as needed), please bring them with you on the day of your procedure.  Your provider has requested that you go to the basement level for lab work before leaving today. Press "B" on the elevator. The lab is located at the first door on the left as you exit the elevator.  Due to recent changes in healthcare laws, you may see the results of your imaging and laboratory studies on MyChart before your provider has had a chance to review them.  We understand that in some cases there may be results that are confusing or concerning to you. Not all laboratory results come back in the same time frame and the provider may be waiting for multiple results in order to interpret others.  Please give [place] 48 hours in order for your  provider to thoroughly review all the results before contacting the office for clarification of your results.   It was a pleasure to see you today!  Thank you for trusting me with your gastrointestinal care!

## 2021-09-05 LAB — IGA: Immunoglobulin A: 112 mg/dL (ref 70–320)

## 2021-09-05 LAB — TISSUE TRANSGLUTAMINASE, IGA: (tTG) Ab, IgA: <1 U/mL

## 2021-09-13 ENCOUNTER — Telehealth: Payer: Self-pay | Admitting: Gastroenterology

## 2021-09-13 NOTE — Telephone Encounter (Signed)
Patient called this morning stating she went to her pharmacy yesterday to pick up her prep but that they had not received the coupon.  She uses the [facility] on [address].  Please call and advise.  Thank you. ?

## 2021-09-14 NOTE — Telephone Encounter (Signed)
I have activated a coupon card and faxed this to her pharmacy.  ? ? ?

## 2021-09-20 ENCOUNTER — Encounter: Payer: Self-pay | Admitting: Gastroenterology

## 2021-09-21 DIAGNOSIS — Z79899 Other long term (current) drug therapy: Secondary | ICD-10-CM | POA: Diagnosis not present

## 2021-09-21 DIAGNOSIS — D72819 Decreased white blood cell count, unspecified: Secondary | ICD-10-CM | POA: Diagnosis not present

## 2021-09-21 DIAGNOSIS — N1831 Chronic kidney disease, stage 3a: Secondary | ICD-10-CM | POA: Diagnosis not present

## 2021-09-21 DIAGNOSIS — M0589 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: Secondary | ICD-10-CM | POA: Diagnosis not present

## 2021-09-21 DIAGNOSIS — M549 Dorsalgia, unspecified: Secondary | ICD-10-CM | POA: Diagnosis not present

## 2021-09-21 DIAGNOSIS — E0590 Thyrotoxicosis, unspecified without thyrotoxic crisis or storm: Secondary | ICD-10-CM | POA: Diagnosis not present

## 2021-09-26 ENCOUNTER — Ambulatory Visit (AMBULATORY_SURGERY_CENTER): Payer: Medicare PPO | Admitting: Gastroenterology

## 2021-09-26 ENCOUNTER — Encounter: Payer: Self-pay | Admitting: Gastroenterology

## 2021-09-26 VITALS — BP 120/60 | HR 71 | Temp 96.0°F | Resp 16 | Ht 66.0 in | Wt 125.0 lb

## 2021-09-26 DIAGNOSIS — Z86010 Personal history of colonic polyps: Secondary | ICD-10-CM | POA: Diagnosis not present

## 2021-09-26 MED ORDER — SODIUM CHLORIDE 0.9 % IV SOLN: 500.0000 mL | INTRAVENOUS | Status: DC

## 2021-09-26 NOTE — Progress Notes (Signed)
VSS, transported to PACU °

## 2021-09-26 NOTE — Op Note (Signed)
[facility] ?Patient Name: Priscilla Thornton ?Procedure Date: 09/26/2021 3:05 PM ?MRN: [id number] ?Endoscopist: Estill Cotta. Loletha Carrow , MD ?Age: 76 ?Referring MD:  ?Date of Birth: Sep 26, 1945 ?Gender: Female ?Account #: [id number] ?Procedure:                Colonoscopy ?Indications:              Surveillance: Personal history of adenomatous  ?                          polyps on last colonoscopy > 5 years ago ?                          (TA last colonoscopy 2014) ?Medicines:                Monitored Anesthesia Care ?Procedure:                Pre-Anesthesia Assessment: ?                          - Prior to the procedure, a History and Physical  ?                          was performed, and patient medications and  ?                          allergies were reviewed. The patient's tolerance of  ?                          previous anesthesia was also reviewed. The risks  ?                          and benefits of the procedure and the sedation  ?                          options and risks were discussed with the patient.  ?                          All questions were answered, and informed consent  ?                          was obtained. Prior Anticoagulants: The patient has  ?                          taken no previous anticoagulant or antiplatelet  ?                          agents. ASA Grade Assessment: II - A patient with  ?                          mild systemic disease. After reviewing the risks  ?                          and benefits, the patient was deemed in  ?  satisfactory condition to undergo the procedure. ?                          After obtaining informed consent, the colonoscope  ?                          was passed under direct vision. Throughout the  ?                          procedure, the patient's blood pressure, pulse, and  ?                          oxygen saturations were monitored continuously. The  ?                          [place] #[id number] was introduced  through  ?                          the anus and advanced to the the cecum, identified  ?                          by appendiceal orifice and ileocecal valve. The  ?                          colonoscopy was performed without difficulty. The  ?                          patient tolerated the procedure well. The quality  ?                          of the bowel preparation was good except the cecum  ?                          was fair and proximal ascending colon (fibrous  ?                          material that could not be completely cleared with  ?                          lavage). The ileocecal valve, appendiceal orifice,  ?                          and rectum were photographed. ?Scope In: 3:16:04 PM ?Scope Out: 3:29:24 PM ?Scope Withdrawal Time: 0 hours 8 minutes 48 seconds  ?Total Procedure Duration: 0 hours 13 minutes 20 seconds  ?Findings:                 The digital rectal exam findings include decreased  ?                          sphincter tone. ?                          The left colon was tortuous. ?  The exam was otherwise without abnormality. ?Complications:            No immediate complications. ?Estimated Blood Loss:     Estimated blood loss: none. ?Impression:               - Decreased sphincter tone found on digital rectal  ?                          exam. ?                          - Tortuous colon. ?                          - The examination was otherwise normal. ?                          - No specimens collected. ?Recommendation:           - Patient has a contact number available for  ?                          emergencies. The signs and symptoms of potential  ?                          delayed complications were discussed with the  ?                          patient. Return to normal activities tomorrow.  ?                          Written discharge instructions were provided to the  ?                          patient. ?                          - Resume previous diet. ?                           - Continue present medications. ?                          - No repeat routine surveillance colonoscopy due to  ?                          age and the absence of colonic polyps. ?Itzae Miralles L. Loletha Carrow, MD ?09/26/2021 3:33:46 PM ?This report has been signed electronically. ?

## 2021-09-26 NOTE — Patient Instructions (Signed)
Resume previous diet and continue present medications.  No repeat routine surveillance colonoscopy due to age and the absence of colonic polyps.  ? ?YOU HAD AN ENDOSCOPIC PROCEDURE TODAY AT [facility]:   Refer to the procedure report that was given to you for any specific questions about what was found during the examination.  If the procedure report does not answer your questions, please call your gastroenterologist to clarify.  If you requested that your care partner not be given the details of your procedure findings, then the procedure report has been included in a sealed envelope for you to review at your convenience later. ? ?YOU SHOULD EXPECT: Some feelings of bloating in the abdomen. Passage of more gas than usual.  Walking can help get rid of the air that was put into your GI tract during the procedure and reduce the bloating. If you had a lower endoscopy (such as a colonoscopy or flexible sigmoidoscopy) you may notice spotting of blood in your stool or on the toilet paper. If you underwent a bowel prep for your procedure, you may not have a normal bowel movement for a few days. ? ?Please Note:  You might notice some irritation and congestion in your nose or some drainage.  This is from the oxygen used during your procedure.  There is no need for concern and it should clear up in a day or so. ? ?SYMPTOMS TO REPORT IMMEDIATELY: ? ?Following lower endoscopy (colonoscopy or flexible sigmoidoscopy): ? Excessive amounts of blood in the stool ? Significant tenderness or worsening of abdominal pains ? Swelling of the abdomen that is new, acute ? Fever of 100?F or higher ? ? ?For urgent or emergent issues, a gastroenterologist can be reached at any hour by calling [phone]. ?Do not use MyChart messaging for urgent concerns.  ? ? ?DIET:  We do recommend a small meal at first, but then you may proceed to your regular diet.  Drink plenty of fluids but you should avoid alcoholic beverages for 24  hours. ? ?ACTIVITY:  You should plan to take it easy for the rest of today and you should NOT DRIVE or use heavy machinery until tomorrow (because of the sedation medicines used during the test).   ? ?FOLLOW UP: ?Our staff will call the number listed on your records 48-72 hours following your procedure to check on you and address any questions or concerns that you may have regarding the information given to you following your procedure. If we do not reach you, we will leave a message.  We will attempt to reach you two times.  During this call, we will ask if you have developed any symptoms of COVID 19. If you develop any symptoms (ie: fever, flu-like symptoms, shortness of breath, cough etc.) before then, please call [phone].  If you test positive for Covid 19 in the 2 weeks post procedure, please call and report this information to [place].   ? ?If any biopsies were taken you will be contacted by phone or by letter within the next 1-3 weeks.  Please call us at [phone] if you have not heard about the biopsies in 3 weeks.  ? ? ?SIGNATURES/CONFIDENTIALITY: ?You and/or your care partner have signed paperwork which will be entered into your electronic medical record.  These signatures attest to the fact that that the information above on your After Visit Summary has been reviewed and is understood.  Full responsibility of the confidentiality of this discharge information lies  with you and/or your care-partner.  ?

## 2021-09-26 NOTE — Progress Notes (Deleted)
Pt's states no medical or surgical changes since previsit or office visit. 

## 2021-09-26 NOTE — Progress Notes (Signed)
No changes to clinical history since GI office visit on 09/02/21. ? ?The patient is appropriate for an endoscopic procedure in the ambulatory setting. ? ?- Wilfrid Lund, MD ? ? ? ?

## 2021-09-28 ENCOUNTER — Telehealth: Payer: Self-pay

## 2021-09-28 NOTE — Telephone Encounter (Signed)
?  Follow up Call- ? ?Call back number 09/26/2021  ?Post procedure Call Back phone  # [phone] work number, ok to leave VM  ?Permission to leave phone message Yes  ?Some recent data might be hidden  ?  ? ?Patient questions: ? ?Do you have a fever, pain , or abdominal swelling? No. ?Pain Score  0 * ? ?Have you tolerated food without any problems? Yes.   ? ?Have you been able to return to your normal activities? Yes.   ? ?Do you have any questions about your discharge instructions: ?Diet   No. ?Medications  No. ?Follow up visit  No. ? ?Do you have questions or concerns about your Care? No. ? ?Actions: ?* If pain score is 4 or above: ?No action needed, pain <4. ? ?Have you developed a fever since your procedure? no ? ?2.   Have you had an respiratory symptoms (SOB or cough) since your procedure? no ? ?3.   Have you tested positive for COVID 19 since your procedure no ? ?4.   Have you had any family members/close contacts diagnosed with the COVID 19 since your procedure?  no ? ? ?If yes to any of these questions please route to Joylene John, RN and Joella Prince, RN ? ? ? ?

## 2021-10-17 ENCOUNTER — Other Ambulatory Visit: Payer: Self-pay

## 2021-10-17 DIAGNOSIS — E782 Mixed hyperlipidemia: Secondary | ICD-10-CM

## 2021-10-17 DIAGNOSIS — I10 Essential (primary) hypertension: Secondary | ICD-10-CM

## 2021-10-17 MED ORDER — HYDROCHLOROTHIAZIDE 12.5 MG PO TABS: 12.5000 mg | ORAL_TABLET | Freq: Every day | ORAL | 1 refills | Status: DC

## 2021-10-17 MED ORDER — LOSARTAN POTASSIUM 100 MG PO TABS: 100.0000 mg | ORAL_TABLET | Freq: Every day | ORAL | 1 refills | Status: DC

## 2021-10-17 MED ORDER — ATORVASTATIN CALCIUM 20 MG PO TABS: 20.0000 mg | ORAL_TABLET | Freq: Every day | ORAL | 1 refills | Status: DC

## 2021-11-03 ENCOUNTER — Encounter: Payer: Self-pay | Admitting: Nurse Practitioner

## 2021-12-14 ENCOUNTER — Ambulatory Visit (INDEPENDENT_AMBULATORY_CARE_PROVIDER_SITE_OTHER): Payer: Medicare PPO | Admitting: Nurse Practitioner

## 2021-12-14 ENCOUNTER — Other Ambulatory Visit: Payer: Self-pay

## 2021-12-14 ENCOUNTER — Ambulatory Visit: Payer: Medicare PPO

## 2021-12-14 ENCOUNTER — Encounter: Payer: Self-pay | Admitting: Nurse Practitioner

## 2021-12-14 VITALS — BP 128/70 | HR 65 | Temp 98.5°F | Ht 66.0 in | Wt 129.0 lb

## 2021-12-14 DIAGNOSIS — E782 Mixed hyperlipidemia: Secondary | ICD-10-CM | POA: Diagnosis not present

## 2021-12-14 DIAGNOSIS — Z79899 Other long term (current) drug therapy: Secondary | ICD-10-CM

## 2021-12-14 DIAGNOSIS — E0590 Thyrotoxicosis, unspecified without thyrotoxic crisis or storm: Secondary | ICD-10-CM | POA: Diagnosis not present

## 2021-12-14 DIAGNOSIS — R634 Abnormal weight loss: Secondary | ICD-10-CM

## 2021-12-14 DIAGNOSIS — I10 Essential (primary) hypertension: Secondary | ICD-10-CM

## 2021-12-14 MED ORDER — ATORVASTATIN CALCIUM 20 MG PO TABS: 20.0000 mg | ORAL_TABLET | Freq: Every day | ORAL | 1 refills | Status: DC

## 2021-12-14 MED ORDER — HYDROCHLOROTHIAZIDE 12.5 MG PO TABS: 12.5000 mg | ORAL_TABLET | Freq: Every day | ORAL | 1 refills | Status: DC

## 2021-12-14 MED ORDER — LOSARTAN POTASSIUM 100 MG PO TABS: 100.0000 mg | ORAL_TABLET | Freq: Every day | ORAL | 1 refills | Status: DC

## 2021-12-14 MED ORDER — POTASSIUM CHLORIDE CRYS ER 10 MEQ PO TBCR: 10.0000 meq | EXTENDED_RELEASE_TABLET | Freq: Every day | ORAL | 1 refills | Status: DC

## 2021-12-14 NOTE — Patient Instructions (Signed)
Hypertension, Adult High blood pressure (hypertension) is when the force of blood pumping through the arteries is too strong. The arteries are the blood vessels that carry blood from the heart throughout the body. Hypertension forces the heart to work harder to pump blood and may cause arteries to become narrow or stiff. Untreated or uncontrolled hypertension can lead to a heart attack, heart failure, a stroke, kidney disease, and other problems. A blood pressure reading consists of a higher number over a lower number. Ideally, your blood pressure should be below 120/80. The first ("top") number is called the systolic pressure. It is a measure of the pressure in your arteries as your heart beats. The second ("bottom") number is called the diastolic pressure. It is a measure of the pressure in your arteries as the heart relaxes. What are the causes? The exact cause of this condition is not known. There are some conditions that result in high blood pressure. What increases the risk? Certain factors may make you more likely to develop high blood pressure. Some of these risk factors are under your control, including: Smoking. Not getting enough exercise or physical activity. Being overweight. Having too much fat, sugar, calories, or salt (sodium) in your diet. Drinking too much alcohol. Other risk factors include: Having a personal history of heart disease, diabetes, high cholesterol, or kidney disease. Stress. Having a family history of high blood pressure and high cholesterol. Having obstructive sleep apnea. Age. The risk increases with age. What are the signs or symptoms? High blood pressure may not cause symptoms. Very high blood pressure (hypertensive crisis) may cause: Headache. Fast or irregular heartbeats (palpitations). Shortness of breath. Nosebleed. Nausea and vomiting. Vision changes. Severe chest pain, dizziness, and seizures. How is this diagnosed? This condition is diagnosed by  measuring your blood pressure while you are seated, with your arm resting on a flat surface, your legs uncrossed, and your feet flat on the floor. The cuff of the blood pressure monitor will be placed directly against the skin of your upper arm at the level of your heart. Blood pressure should be measured at least twice using the same arm. Certain conditions can cause a difference in blood pressure between your right and left arms. If you have a high blood pressure reading during one visit or you have normal blood pressure with other risk factors, you may be asked to: Return on a different day to have your blood pressure checked again. Monitor your blood pressure at home for 1 week or longer. If you are diagnosed with hypertension, you may have other blood or imaging tests to help your health care provider understand your overall risk for other conditions. How is this treated? This condition is treated by making healthy lifestyle changes, such as eating healthy foods, exercising more, and reducing your alcohol intake. You may be referred for counseling on a healthy diet and physical activity. Your health care provider may prescribe medicine if lifestyle changes are not enough to get your blood pressure under control and if: Your systolic blood pressure is above 130. Your diastolic blood pressure is above 80. Your personal target blood pressure may vary depending on your medical conditions, your age, and other factors. Follow these instructions at home: Eating and drinking  Eat a diet that is high in fiber and potassium, and low in sodium, added sugar, and fat. An example of this eating plan is called the DASH diet. DASH stands for Dietary Approaches to Stop Hypertension. To eat this way: Eat   plenty of fresh fruits and vegetables. Try to fill one half of your plate at each meal with fruits and vegetables. Eat whole grains, such as whole-wheat pasta, brown rice, or whole-grain bread. Fill about one  fourth of your plate with whole grains. Eat or drink low-fat dairy products, such as skim milk or low-fat yogurt. Avoid fatty cuts of meat, processed or cured meats, and poultry with skin. Fill about one fourth of your plate with lean proteins, such as fish, chicken without skin, beans, eggs, or tofu. Avoid pre-made and processed foods. These tend to be higher in sodium, added sugar, and fat. Reduce your daily sodium intake. Many people with hypertension should eat less than 1,500 mg of sodium a day. Do not drink alcohol if: Your health care provider tells you not to drink. You are pregnant, may be pregnant, or are planning to become pregnant. If you drink alcohol: Limit how much you have to: 0-1 drink a day for women. 0-2 drinks a day for men. Know how much alcohol is in your drink. In the [place] one drink equals one 12 oz bottle of beer (355 mL), one 5 oz glass of wine (148 mL), or one 1 oz glass of hard liquor (44 mL). Lifestyle  Work with your health care provider to maintain a healthy body weight or to lose weight. Ask what an ideal weight is for you. Get at least 30 minutes of exercise that causes your heart to beat faster (aerobic exercise) most days of the week. Activities may include walking, swimming, or biking. Include exercise to strengthen your muscles (resistance exercise), such as Pilates or lifting weights, as part of your weekly exercise routine. Try to do these types of exercises for 30 minutes at least 3 days a week. Do not use any products that contain nicotine or tobacco. These products include cigarettes, chewing tobacco, and vaping devices, such as e-cigarettes. If you need help quitting, ask your health care provider. Monitor your blood pressure at home as told by your health care provider. Keep all follow-up visits. This is important. Medicines Take over-the-counter and prescription medicines only as told by your health care provider. Follow directions carefully. Blood  pressure medicines must be taken as prescribed. Do not skip doses of blood pressure medicine. Doing this puts you at risk for problems and can make the medicine less effective. Ask your health care provider about side effects or reactions to medicines that you should watch for. Contact a health care provider if you: Think you are having a reaction to a medicine you are taking. Have headaches that keep coming back (recurring). Feel dizzy. Have swelling in your ankles. Have trouble with your vision. Get help right away if you: Develop a severe headache or confusion. Have unusual weakness or numbness. Feel faint. Have severe pain in your chest or abdomen. Vomit repeatedly. Have trouble breathing. These symptoms may be an emergency. Get help right away. Call 911. Do not wait to see if the symptoms will go away. Do not drive yourself to the hospital. Summary Hypertension is when the force of blood pumping through your arteries is too strong. If this condition is not controlled, it may put you at risk for serious complications. Your personal target blood pressure may vary depending on your medical conditions, your age, and other factors. For most people, a normal blood pressure is less than 120/80. Hypertension is treated with lifestyle changes, medicines, or a combination of both. Lifestyle changes include losing weight, eating a healthy,   low-sodium diet, exercising more, and limiting alcohol. This information is not intended to replace advice given to you by your health care provider. Make sure you discuss any questions you have with your health care provider. Document Revised: 05/03/2021 Document Reviewed: 05/03/2021 Elsevier Patient Education  2023 [employer] Inc[employer]  

## 2021-12-14 NOTE — Progress Notes (Signed)
I,Tianna Badgett,acting as a Education administrator for [employer], FNP.,have documented all relevant documentation on the behalf of [name], FNP,as directed by  [name], FNP while in the presence of [name], [place].  This visit occurred during the SARS-CoV-2 public health emergency.  Safety protocols were in place, including screening questions prior to the visit, additional usage of staff PPE, and extensive cleaning of exam room while observing appropriate contact time as indicated for disinfecting solutions.  Subjective:     Patient ID: Priscilla Thornton , female    DOB: February 21, 1946 , 76 y.o.   MRN: [id number]   Chief Complaint  Patient presents with   Hypertension    HPI  Patient here for a blood pressure f/u. She reports having a better appetite. Continues to work part time at [employer].    Wt Readings from Last 3 Encounters: 12/14/21 : 129 lb (58.5 kg) 09/26/21 : 125 lb (56.7 kg) 08/23/21 : 121 lb 12.8 oz (55.2 kg)      Hypertension This is a chronic problem. The current episode started more than 1 year ago. The problem is unchanged. The problem is controlled. Pertinent negatives include no chest pain, headaches, palpitations or shortness of breath. There are no associated agents to hypertension. There are no known risk factors for coronary artery disease. Past treatments include angiotensin blockers. The current treatment provides moderate improvement. There are no compliance problems.  There is no history of angina, kidney disease or CVA. Identifiable causes of hypertension include a thyroid problem. There is no history of chronic renal disease.    Past Medical History:  Diagnosis Date   Arthritis    Hyperlipidemia    Hypertension      Family History  Problem Relation Age of Onset   Diabetes Mother    Heart disease Father    Colon cancer Neg Hx    Esophageal cancer Neg Hx    Rectal cancer Neg Hx    Stomach cancer Neg Hx      Current Outpatient Medications:     acetaminophen (TYLENOL) 500 MG tablet, Take 500 mg by mouth every 6 (six) hours as needed., Disp: , Rfl:    aspirin 81 MG tablet, Take 81 mg by mouth daily., Disp: , Rfl:    atorvastatin (LIPITOR) 20 MG tablet, Take 1 tablet (20 mg total) by mouth daily., Disp: 90 tablet, Rfl: 1   fish oil-omega-3 fatty acids 1000 MG capsule, Take 1 g by mouth daily., Disp: , Rfl:    folic acid (FOLVITE) 1 MG tablet, Take 1 mg by mouth daily., Disp: , Rfl:    hydrochlorothiazide (HYDRODIURIL) 12.5 MG tablet, Take 1 tablet (12.5 mg total) by mouth daily., Disp: 90 tablet, Rfl: 1   leflunomide (ARAVA) 10 MG tablet, Take 10 mg by mouth daily., Disp: , Rfl:    losartan (COZAAR) 100 MG tablet, Take 1 tablet (100 mg total) by mouth daily., Disp: 90 tablet, Rfl: 1   methimazole (TAPAZOLE) 5 MG tablet, Take 5 mg by mouth daily., Disp: , Rfl:    Multiple Vitamins-Calcium (ONE-A-DAY WOMENS FORMULA PO), Take 1 tablet by mouth daily., Disp: , Rfl:    PEG-KCl-NaCl-NaSulf-Na Asc-C (PLENVU) 140 g SOLR, Take 140 g by mouth as directed. Manufacturer's coupon Universal coupon code:BIN: P2366821; GROUP: [id number]; PCN: [id number]; ID: [id number]; PAY NO MORE $50, Disp: 1 each, Rfl: 0   potassium chloride (KLOR-CON M10) 10 MEQ tablet, Take 1 tablet (10 mEq total) by mouth daily. with food, Disp: 90 tablet, Rfl: 1  No Known Allergies   Review of Systems  Constitutional: Negative.   Respiratory: Negative.  Negative for shortness of breath.   Cardiovascular:  Negative for chest pain, palpitations and leg swelling.  Neurological: Negative.  Negative for headaches.  Psychiatric/Behavioral: Negative.      Today's Vitals   12/14/21 1426  BP: 128/70  Pulse: 65  Temp: 98.5 F (36.9 C)  TempSrc: Oral  Weight: 129 lb (58.5 kg)  Height: _0  (1.676 m)   Body mass index is 20.82 kg/m.  Wt Readings from Last 3 Encounters:  12/14/21 129 lb (58.5 kg)  09/26/21 125 lb (56.7 kg)  08/23/21 121 lb 12.8 oz (55.2 kg)    Objective:   Physical Exam Vitals reviewed.  Constitutional:      General: She is not in acute distress.    Appearance: Normal appearance.  Cardiovascular:     Rate and Rhythm: Normal rate and regular rhythm.     Pulses: Normal pulses.     Heart sounds: Normal heart sounds. No murmur heard. Pulmonary:     Effort: Pulmonary effort is normal. No respiratory distress.     Breath sounds: Normal breath sounds. No wheezing.  Musculoskeletal:     Cervical back: Normal range of motion and neck supple.  Skin:    General: Skin is warm and dry.     Capillary Refill: Capillary refill takes less than 2 seconds.  Neurological:     General: No focal deficit present.     Mental Status: She is alert and oriented to person, place, and time.     Cranial Nerves: No cranial nerve deficit.     Motor: No weakness.  Psychiatric:        Mood and Affect: Mood normal.        Behavior: Behavior normal.        Thought Content: Thought content normal.        Judgment: Judgment normal.        Assessment And Plan:     1. Essential hypertension Comments: Blood pressure well controlled, continue current medications.  - BMP8+EGFR  2. Mixed hyperlipidemia Comments: Cholesterol level is well controlled. Continue low fat diet.   3. Hyperthyroidism Comments: Continue follow up with Dr. Chalmers Cater, copy of thyroid functions resulted to Dr. Chalmers Cater - TSH - T4 - T3, free  4. Abnormal weight loss Comments: She has gained approximately 8 lbs since January. Her appetite has improved, encouraged to continue with Ensure PRN.  5. Other long term (current) drug therapy - CBC no Diff     Patient was given opportunity to ask questions. Patient verbalized understanding of the plan and was able to repeat key elements of the plan. All questions were answered to their satisfaction.  [name], FNP   I, [name], FNP, have reviewed all documentation for this visit. The documentation on 12/14/21 for the exam, diagnosis,  procedures, and orders are all accurate and complete.   IF YOU HAVE BEEN REFERRED TO A SPECIALIST, IT MAY TAKE 1-2 WEEKS TO SCHEDULE/PROCESS THE REFERRAL. IF YOU HAVE NOT HEARD FROM US/SPECIALIST IN TWO WEEKS, PLEASE GIVE [place] A CALL AT [phone] X [phone].   THE PATIENT IS ENCOURAGED TO PRACTICE SOCIAL DISTANCING DUE TO THE COVID-19 PANDEMIC.

## 2021-12-20 DIAGNOSIS — E0520 Thyrotoxicosis with toxic multinodular goiter without thyrotoxic crisis or storm: Secondary | ICD-10-CM | POA: Diagnosis not present

## 2021-12-20 DIAGNOSIS — I10 Essential (primary) hypertension: Secondary | ICD-10-CM | POA: Diagnosis not present

## 2021-12-22 ENCOUNTER — Ambulatory Visit: Payer: Medicare PPO

## 2021-12-26 DIAGNOSIS — Z79899 Other long term (current) drug therapy: Secondary | ICD-10-CM | POA: Diagnosis not present

## 2021-12-26 DIAGNOSIS — D72819 Decreased white blood cell count, unspecified: Secondary | ICD-10-CM | POA: Diagnosis not present

## 2021-12-26 DIAGNOSIS — E0590 Thyrotoxicosis, unspecified without thyrotoxic crisis or storm: Secondary | ICD-10-CM | POA: Diagnosis not present

## 2021-12-26 DIAGNOSIS — N1831 Chronic kidney disease, stage 3a: Secondary | ICD-10-CM | POA: Diagnosis not present

## 2021-12-26 DIAGNOSIS — M0589 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: Secondary | ICD-10-CM | POA: Diagnosis not present

## 2021-12-26 DIAGNOSIS — M549 Dorsalgia, unspecified: Secondary | ICD-10-CM | POA: Diagnosis not present

## 2022-01-09 DIAGNOSIS — H524 Presbyopia: Secondary | ICD-10-CM | POA: Diagnosis not present

## 2022-01-09 DIAGNOSIS — H25813 Combined forms of age-related cataract, bilateral: Secondary | ICD-10-CM | POA: Diagnosis not present

## 2022-01-09 DIAGNOSIS — H5203 Hypermetropia, bilateral: Secondary | ICD-10-CM | POA: Diagnosis not present

## 2022-01-12 ENCOUNTER — Ambulatory Visit: Payer: Medicare PPO

## 2022-01-12 ENCOUNTER — Telehealth: Payer: Self-pay

## 2022-01-12 NOTE — Telephone Encounter (Signed)
This nurse called patient in regards to missed appointment. Message left for patient to call back in order to reschedule for another time.

## 2022-01-17 ENCOUNTER — Telehealth: Payer: Self-pay | Admitting: Nurse Practitioner

## 2022-01-17 NOTE — Telephone Encounter (Signed)
Left message for patient to call back and schedule Medicare Annual Wellness Visit (AWV) either virtually or in office.  Left both my jabber number [phone] and office number    Last AWV 01/05/21 ; please schedule at anytime with [facility]

## 2022-01-25 ENCOUNTER — Ambulatory Visit (INDEPENDENT_AMBULATORY_CARE_PROVIDER_SITE_OTHER): Payer: Medicare PPO

## 2022-01-25 VITALS — BP 106/62 | HR 79 | Temp 98.9°F | Ht 65.0 in | Wt 130.0 lb

## 2022-01-25 DIAGNOSIS — Z0000 Encounter for general adult medical examination without abnormal findings: Secondary | ICD-10-CM | POA: Diagnosis not present

## 2022-01-25 NOTE — Progress Notes (Signed)
Subjective:   Priscilla Thornton is a 76 y.o. female who presents for Medicare Annual (Subsequent) preventive examination.  Review of Systems     Cardiac Risk Factors include: advanced age (>72mn, >>71women);hypertension     Objective:    Today's Vitals   01/25/22 1525  BP: 106/62  Pulse: 79  Temp: 98.9 F (37.2 C)  TempSrc: Oral  SpO2: 97%  Weight: 130 lb (59 kg)  Height: '5\' 5"'$  (1.651 m)   Body mass index is 21.63 kg/m.     01/25/2022    3:36 PM 01/05/2021    9:02 AM 12/31/2019    8:47 AM 12/19/2018    9:50 AM  Advanced Directives  Does Patient Have a Medical Advance Directive? No No No No  Would patient like information on creating a medical advance directive? No - Patient declined No - Patient declined No - Patient declined No - Patient declined    Current Medications (verified) Outpatient Encounter Medications as of 01/25/2022  Medication Sig   acetaminophen (TYLENOL) 500 MG tablet Take 500 mg by mouth every 6 (six) hours as needed.   aspirin 81 MG tablet Take 81 mg by mouth daily.   atorvastatin (LIPITOR) 20 MG tablet Take 1 tablet (20 mg total) by mouth daily.   fish oil-omega-3 fatty acids 1000 MG capsule Take 1 g by mouth daily.   folic acid (FOLVITE) 1 MG tablet Take 1 mg by mouth daily.   hydrochlorothiazide (HYDRODIURIL) 12.5 MG tablet Take 1 tablet (12.5 mg total) by mouth daily.   leflunomide (ARAVA) 10 MG tablet Take 10 mg by mouth daily.   losartan (COZAAR) 100 MG tablet Take 1 tablet (100 mg total) by mouth daily.   methimazole (TAPAZOLE) 5 MG tablet Take 5 mg by mouth daily.   Multiple Vitamins-Calcium (ONE-A-DAY WOMENS FORMULA PO) Take 1 tablet by mouth daily.   potassium chloride (KLOR-CON M10) 10 MEQ tablet Take 1 tablet (10 mEq total) by mouth daily. with food   PEG-KCl-NaCl-NaSulf-Na Asc-C (PLENVU) 140 g SOLR Take 140 g by mouth as directed. Manufacturer's coupon Universal coupon code:BIN: 0P2366821 GROUP: [id number] PCN: [id number]; ID:: [id number] PAY  NO MORE $50 (Patient not taking: Reported on 01/25/2022)   No facility-administered encounter medications on file as of 01/25/2022.    Allergies (verified) Patient has no known allergies.   History: Past Medical History:  Diagnosis Date   Arthritis    Hyperlipidemia    Hypertension    History reviewed. No pertinent surgical history. Family History  Problem Relation Age of Onset   Diabetes Mother    Heart disease Father    Colon cancer Neg Hx    Esophageal cancer Neg Hx    Rectal cancer Neg Hx    Stomach cancer Neg Hx    Social History   Socioeconomic History   Marital status: Single    Spouse name: Not on file   Number of children: Not on file   Years of education: Not on file   Highest education level: Not on file  Occupational History   Occupation: retired  Tobacco Use   Smoking status: Former    Packs/day: 0.20    Types: Cigarettes    Quit date: 2016    Years since quitting: 7.5   Smokeless tobacco: Never   Tobacco comments:    2-3 cigaretts a day  Vaping Use   Vaping Use: Never used  Substance and Sexual Activity   Alcohol use: No   Drug use: No  Sexual activity: Not Currently  Other Topics Concern   Not on file  Social History Narrative   Not on file   Social Determinants of Health   Financial Resource Strain: Low Risk  (01/25/2022)   Overall Financial Resource Strain (CARDIA)    Difficulty of Paying Living Expenses: Not hard at all  Food Insecurity: No Food Insecurity (01/25/2022)   Hunger Vital Sign    Worried About Running Out of Food in the Last Year: Never true    Ran Out of Food in the Last Year: Never true  Transportation Needs: No Transportation Needs (01/25/2022)   PRAPARE - Hydrologist (Medical): No    Lack of Transportation (Non-Medical): No  Physical Activity: Inactive (01/25/2022)   Exercise Vital Sign    Days of Exercise per Week: 0 days    Minutes of Exercise per Session: 0 min  Stress: No Stress  Concern Present (01/25/2022)   [place]    Feeling of Stress : Not at all  Social Connections: Not on file    Tobacco Counseling Counseling given: Not Answered Tobacco comments: 2-3 cigaretts a day   Clinical Intake:  Pre-visit preparation completed: Yes  Pain : No/denies pain     Nutritional Status: BMI of 19-24  Normal Nutritional Risks: None Diabetes: No  How often do you need to have someone help you when you read instructions, pamphlets, or other written materials from your doctor or pharmacy?: 1 - Never What is the last grade level you completed in school?: some college  Diabetic? no  Interpreter Needed?: No  Information entered by :: NAllen LPN   Activities of Daily Living    01/25/2022    3:37 PM  In your present state of health, do you have any difficulty performing the following activities:  Hearing? 0  Vision? 0  Difficulty concentrating or making decisions? 0  Comment sometimes memory  Walking or climbing stairs? 0  Dressing or bathing? 0  Doing errands, shopping? 0  Preparing Food and eating ? N  Using the Toilet? N  In the past six months, have you accidently leaked urine? N  Do you have problems with loss of bowel control? N  Managing your Medications? N  Managing your Finances? N  Housekeeping or managing your Housekeeping? N    Patient Care Team: [name], FNP as PCP - General ([place]) Jacelyn Pi, MD as Referring Physician (Endocrinology) Lahoma Rocker, MD as Consulting Physician (Rheumatology) [place], [place] any recent Medical Services you may have received from other than [facility] providers in the past year (date may be approximate).     Assessment:   This is a routine wellness examination for [place].  Hearing/Vision screen Vision Screening - Comments:: Regular Eye exams, Dr. Gershon Crane  Dietary issues and exercise activities  discussed: Current Exercise Habits: The patient does not participate in regular exercise at present   Goals Addressed             This Visit's Progress    Patient Stated       01/25/2022, wants to gain weight       Depression Screen    01/25/2022    3:36 PM 01/05/2021    9:03 AM 12/31/2019    8:48 AM 06/25/2019    9:00 AM 12/19/2018    9:50 AM 06/03/2018   10:24 AM  PHQ 2/9 Scores  PHQ - 2 Score 0 0  0 0 0 0  PHQ- 9 Score   0  1     Fall Risk    01/25/2022    3:36 PM 01/05/2021    9:03 AM 12/31/2019    8:48 AM 06/25/2019    9:00 AM 12/19/2018    9:50 AM  Fall Risk   Falls in the past year? 0 0 0 0 0  Number falls in past yr: 0      Injury with Fall? 1      Risk for fall due to : Medication side effect Medication side effect Medication side effect  Medication side effect  Follow up Falls evaluation completed;Falls prevention discussed;Education provided Falls evaluation completed;Education provided;Falls prevention discussed Falls evaluation completed;Education provided;Falls prevention discussed  Falls prevention discussed;Education provided    FALL RISK PREVENTION PERTAINING TO THE HOME:  Any stairs in or around the home? No  If so, are there any without handrails? N/a Home free of loose throw rugs in walkways, pet beds, electrical cords, etc? Yes  Adequate lighting in your home to reduce risk of falls? Yes   ASSISTIVE DEVICES UTILIZED TO PREVENT FALLS:  Life alert? No  Use of a cane, walker or w/c? No  Grab bars in the bathroom? No  Shower chair or bench in shower? No  Elevated toilet seat or a handicapped toilet? No   TIMED UP AND GO:  Was the test performed? No .    Gait steady and fast without use of assistive device  Cognitive Function:        01/25/2022    3:39 PM 01/05/2021    9:04 AM 12/31/2019    8:50 AM 12/19/2018    9:53 AM  6CIT Screen  What Year? 0 points 0 points 0 points 0 points  What month? 0 points 0 points 0 points 0 points  What  time? 0 points 0 points 0 points 0 points  Count back from 20 0 points 0 points 0 points 0 points  Months in reverse 4 points 2 points 2 points 4 points  Repeat phrase 6 points 6 points 4 points 2 points  Total Score 10 points 8 points 6 points 6 points    Immunizations Immunization History  Administered Date(s) Administered   Moderna SARS-COV2 Booster Vaccination 05/31/2020   Moderna Sars-Covid-2 Vaccination 09/25/2019, 10/28/2019, 05/31/2021    TDAP status: Due, Education has been provided regarding the importance of this vaccine. Advised may receive this vaccine at local pharmacy or Health Dept. Aware to provide a copy of the vaccination record if obtained from local pharmacy or Health Dept. Verbalized acceptance and understanding.  Flu Vaccine status: Declined, Education has been provided regarding the importance of this vaccine but patient still declined. Advised may receive this vaccine at local pharmacy or Health Dept. Aware to provide a copy of the vaccination record if obtained from local pharmacy or Health Dept. Verbalized acceptance and understanding.  Pneumococcal vaccine status: Declined,  Education has been provided regarding the importance of this vaccine but patient still declined. Advised may receive this vaccine at local pharmacy or Health Dept. Aware to provide a copy of the vaccination record if obtained from local pharmacy or Health Dept. Verbalized acceptance and understanding.   Covid-19 vaccine status: Completed vaccines  Qualifies for Shingles Vaccine? Yes   Zostavax completed No   Shingrix Completed?: No.    Education has been provided regarding the importance of this vaccine. Patient has been advised to call insurance company to determine out of pocket  expense if they have not yet received this vaccine. Advised may also receive vaccine at local pharmacy or Health Dept. Verbalized acceptance and understanding.  Screening Tests Health Maintenance  Topic Date Due    COVID-19 Vaccine (4 - Booster for Moderna series) 07/26/2021   Zoster Vaccines- Shingrix (1 of 2) 03/16/2022 (Originally 09/18/1964)   Pneumonia Vaccine 35+ Years old (1 - PCV) 07/07/2022 (Originally 09/19/2011)   TETANUS/TDAP  01/26/2023 (Originally 09/18/1964)   INFLUENZA VACCINE  02/07/2022   DEXA SCAN  Completed   Hepatitis C Screening  Completed   HPV VACCINES  Aged Out   COLONOSCOPY (Pts 45-28yr Insurance coverage will need to be confirmed)  Discontinued    Health Maintenance  Health Maintenance Due  Topic Date Due   COVID-19 Vaccine (4 - Booster for Moderna series) 07/26/2021    Colorectal cancer screening: No longer required.   Mammogram status: No longer required due to age.  Bone Density status: Completed 03/25/2018.   Lung Cancer Screening: (Low Dose CT Chest recommended if Age 403-80years, 30 pack-year currently smoking OR have quit w/in 15years.) does not qualify.   Lung Cancer Screening Referral: no  Additional Screening:  Hepatitis C Screening: does qualify; Completed 12/23/2018  Vision Screening: Recommended annual ophthalmology exams for early detection of glaucoma and other disorders of the eye. Is the patient up to date with their annual eye exam?  Yes  Who is the provider or what is the name of the office in which the patient attends annual eye exams? Dr. SGershon CraneIf pt is not established with a provider, would they like to be referred to a provider to establish care? No .   Dental Screening: Recommended annual dental exams for proper oral hygiene  Community Resource Referral / Chronic Care Management: CRR required this visit?  No   CCM required this visit?  No      Plan:     I have personally reviewed and noted the following in the patient's chart:   Medical and social history Use of alcohol, tobacco or illicit drugs  Current medications and supplements including opioid prescriptions.  Functional ability and status Nutritional status Physical  activity Advanced directives List of other physicians Hospitalizations, surgeries, and ER visits in previous 12 months Vitals Screenings to include cognitive, depression, and falls Referrals and appointments  In addition, I have reviewed and discussed with patient certain preventive protocols, quality metrics, and best practice recommendations. A written personalized care plan for preventive services as well as general preventive health recommendations were provided to patient.     NKellie Simmering LPN   [id number]  Nurse Notes: none

## 2022-01-25 NOTE — Patient Instructions (Signed)
Priscilla Thornton , Thank you for taking time to come for your Medicare Wellness Visit. I appreciate your ongoing commitment to your health goals. Please review the following plan we discussed and let me know if I can assist you in the future.   Screening recommendations/referrals: Colonoscopy: not required Mammogram: not required Bone Density: completed 03/25/2018 Recommended yearly ophthalmology/optometry visit for glaucoma screening and checkup Recommended yearly dental visit for hygiene and checkup  Vaccinations: Influenza vaccine: due 02/07/2022 Pneumococcal vaccine: decline Tdap vaccine: decline Shingles vaccine: decline   Covid-19: 05/31/2021, 10/28/2019, 09/25/2019  Advanced directives: Advance directive discussed with you today. Even though you declined this today please call our office should you change your mind and we can give you the proper paperwork for you to fill out.   Conditions/risks identified: none  Next appointment: Follow up in one year for your annual wellness visit    Preventive Care 65 Years and Older, Female Preventive care refers to lifestyle choices and visits with your health care provider that can promote health and wellness. What does preventive care include? A yearly physical exam. This is also called an annual well check. Dental exams once or twice a year. Routine eye exams. Ask your health care provider how often you should have your eyes checked. Personal lifestyle choices, including: Daily care of your teeth and gums. Regular physical activity. Eating a healthy diet. Avoiding tobacco and drug use. Limiting alcohol use. Practicing safe sex. Taking low-dose aspirin every day. Taking vitamin and mineral supplements as recommended by your health care provider. What happens during an annual well check? The services and screenings done by your health care provider during your annual well check will depend on your age, overall health, lifestyle risk  factors, and family history of disease. Counseling  Your health care provider may ask you questions about your: Alcohol use. Tobacco use. Drug use. Emotional well-being. Home and relationship well-being. Sexual activity. Eating habits. History of falls. Memory and ability to understand (cognition). Work and work Statistician. Reproductive health. Screening  You may have the following tests or measurements: Height, weight, and BMI. Blood pressure. Lipid and cholesterol levels. These may be checked every 5 years, or more frequently if you are over 42 years old. Skin check. Lung cancer screening. You may have this screening every year starting at age 19 if you have a 30-pack-year history of smoking and currently smoke or have quit within the past 15 years. Fecal occult blood test (FOBT) of the stool. You may have this test every year starting at age 29. Flexible sigmoidoscopy or colonoscopy. You may have a sigmoidoscopy every 5 years or a colonoscopy every 10 years starting at age 37. Hepatitis C blood test. Hepatitis B blood test. Sexually transmitted disease (STD) testing. Diabetes screening. This is done by checking your blood sugar (glucose) after you have not eaten for a while (fasting). You may have this done every 1-3 years. Bone density scan. This is done to screen for osteoporosis. You may have this done starting at age 10. Mammogram. This may be done every 1-2 years. Talk to your health care provider about how often you should have regular mammograms. Talk with your health care provider about your test results, treatment options, and if necessary, the need for more tests. Vaccines  Your health care provider may recommend certain vaccines, such as: Influenza vaccine. This is recommended every year. Tetanus, diphtheria, and acellular pertussis (Tdap, Td) vaccine. You may need a Td booster every 10 years. Zoster vaccine. You  may need this after age 70. Pneumococcal 13-valent  conjugate (PCV13) vaccine. One dose is recommended after age 43. Pneumococcal polysaccharide (PPSV23) vaccine. One dose is recommended after age 58. Talk to your health care provider about which screenings and vaccines you need and how often you need them. This information is not intended to replace advice given to you by your health care provider. Make sure you discuss any questions you have with your health care provider. Document Released: 07/23/2015 Document Revised: 03/15/2016 Document Reviewed: 04/27/2015 Elsevier Interactive Patient Education  2017 [place] Prevention in the Home Falls can cause injuries. They can happen to people of all ages. There are many things you can do to make your home safe and to help prevent falls. What can I do on the outside of my home? Regularly fix the edges of walkways and driveways and fix any cracks. Remove anything that might make you trip as you walk through a door, such as a raised step or threshold. Trim any bushes or trees on the path to your home. Use bright outdoor lighting. Clear any walking paths of anything that might make someone trip, such as rocks or tools. Regularly check to see if handrails are loose or broken. Make sure that both sides of any steps have handrails. Any raised decks and porches should have guardrails on the edges. Have any leaves, snow, or ice cleared regularly. Use sand or salt on walking paths during winter. Clean up any spills in your garage right away. This includes oil or grease spills. What can I do in the bathroom? Use night lights. Install grab bars by the toilet and in the tub and shower. Do not use towel bars as grab bars. Use non-skid mats or decals in the tub or shower. If you need to sit down in the shower, use a plastic, non-slip stool. Keep the floor dry. Clean up any water that spills on the floor as soon as it happens. Remove soap buildup in the tub or shower regularly. Attach bath mats  securely with double-sided non-slip rug tape. Do not have throw rugs and other things on the floor that can make you trip. What can I do in the bedroom? Use night lights. Make sure that you have a light by your bed that is easy to reach. Do not use any sheets or blankets that are too big for your bed. They should not hang down onto the floor. Have a firm chair that has side arms. You can use this for support while you get dressed. Do not have throw rugs and other things on the floor that can make you trip. What can I do in the kitchen? Clean up any spills right away. Avoid walking on wet floors. Keep items that you use a lot in easy-to-reach places. If you need to reach something above you, use a strong step stool that has a grab bar. Keep electrical cords out of the way. Do not use floor polish or wax that makes floors slippery. If you must use wax, use non-skid floor wax. Do not have throw rugs and other things on the floor that can make you trip. What can I do with my stairs? Do not leave any items on the stairs. Make sure that there are handrails on both sides of the stairs and use them. Fix handrails that are broken or loose. Make sure that handrails are as long as the stairways. Check any carpeting to make sure that it is firmly  attached to the stairs. Fix any carpet that is loose or worn. Avoid having throw rugs at the top or bottom of the stairs. If you do have throw rugs, attach them to the floor with carpet tape. Make sure that you have a light switch at the top of the stairs and the bottom of the stairs. If you do not have them, ask someone to add them for you. What else can I do to help prevent falls? Wear shoes that: Do not have high heels. Have rubber bottoms. Are comfortable and fit you well. Are closed at the toe. Do not wear sandals. If you use a stepladder: Make sure that it is fully opened. Do not climb a closed stepladder. Make sure that both sides of the stepladder  are locked into place. Ask someone to hold it for you, if possible. Clearly mark and make sure that you can see: Any grab bars or handrails. First and last steps. Where the edge of each step is. Use tools that help you move around (mobility aids) if they are needed. These include: Canes. Walkers. Scooters. Crutches. Turn on the lights when you go into a dark area. Replace any light bulbs as soon as they burn out. Set up your furniture so you have a clear path. Avoid moving your furniture around. If any of your floors are uneven, fix them. If there are any pets around you, be aware of where they are. Review your medicines with your doctor. Some medicines can make you feel dizzy. This can increase your chance of falling. Ask your doctor what other things that you can do to help prevent falls. This information is not intended to replace advice given to you by your health care provider. Make sure you discuss any questions you have with your health care provider. Document Released: 04/22/2009 Document Revised: 12/02/2015 Document Reviewed: 07/31/2014 Elsevier Interactive Patient Education  2017 [employer].

## 2022-03-28 DIAGNOSIS — Z79899 Other long term (current) drug therapy: Secondary | ICD-10-CM | POA: Diagnosis not present

## 2022-03-28 DIAGNOSIS — D72819 Decreased white blood cell count, unspecified: Secondary | ICD-10-CM | POA: Diagnosis not present

## 2022-03-28 DIAGNOSIS — N1831 Chronic kidney disease, stage 3a: Secondary | ICD-10-CM | POA: Diagnosis not present

## 2022-03-28 DIAGNOSIS — M0589 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: Secondary | ICD-10-CM | POA: Diagnosis not present

## 2022-03-28 DIAGNOSIS — M549 Dorsalgia, unspecified: Secondary | ICD-10-CM | POA: Diagnosis not present

## 2022-03-28 DIAGNOSIS — E0590 Thyrotoxicosis, unspecified without thyrotoxic crisis or storm: Secondary | ICD-10-CM | POA: Diagnosis not present

## 2022-04-12 ENCOUNTER — Ambulatory Visit (INDEPENDENT_AMBULATORY_CARE_PROVIDER_SITE_OTHER): Payer: Medicare PPO | Admitting: Nurse Practitioner

## 2022-04-12 ENCOUNTER — Encounter: Payer: Self-pay | Admitting: Nurse Practitioner

## 2022-04-12 VITALS — BP 110/72 | HR 90 | Temp 98.0°F | Ht 65.0 in | Wt 133.4 lb

## 2022-04-12 DIAGNOSIS — D229 Melanocytic nevi, unspecified: Secondary | ICD-10-CM | POA: Diagnosis not present

## 2022-04-12 DIAGNOSIS — R222 Localized swelling, mass and lump, trunk: Secondary | ICD-10-CM | POA: Diagnosis not present

## 2022-04-12 DIAGNOSIS — R9431 Abnormal electrocardiogram [ECG] [EKG]: Secondary | ICD-10-CM

## 2022-04-12 DIAGNOSIS — E782 Mixed hyperlipidemia: Secondary | ICD-10-CM | POA: Diagnosis not present

## 2022-04-12 DIAGNOSIS — Z79899 Other long term (current) drug therapy: Secondary | ICD-10-CM | POA: Diagnosis not present

## 2022-04-12 DIAGNOSIS — M1990 Unspecified osteoarthritis, unspecified site: Secondary | ICD-10-CM

## 2022-04-12 DIAGNOSIS — E0590 Thyrotoxicosis, unspecified without thyrotoxic crisis or storm: Secondary | ICD-10-CM | POA: Diagnosis not present

## 2022-04-12 DIAGNOSIS — N183 Chronic kidney disease, stage 3 unspecified: Secondary | ICD-10-CM

## 2022-04-12 DIAGNOSIS — Z0000 Encounter for general adult medical examination without abnormal findings: Secondary | ICD-10-CM

## 2022-04-12 DIAGNOSIS — I129 Hypertensive chronic kidney disease with stage 1 through stage 4 chronic kidney disease, or unspecified chronic kidney disease: Secondary | ICD-10-CM

## 2022-04-12 DIAGNOSIS — Z2821 Immunization not carried out because of patient refusal: Secondary | ICD-10-CM

## 2022-04-12 LAB — POCT URINALYSIS DIPSTICK
Bilirubin, UA: NEGATIVE
Blood, UA: NEGATIVE
Glucose, UA: NEGATIVE
Ketones, UA: NEGATIVE
Nitrite, UA: NEGATIVE
Protein, UA: POSITIVE — AB
Spec Grav, UA: 1.015 (ref 1.010–1.025)
Urobilinogen, UA: 0.2 E.U./dL
pH, UA: 6 (ref 5.0–8.0)

## 2022-04-12 MED ORDER — LOSARTAN POTASSIUM 100 MG PO TABS: 100.0000 mg | ORAL_TABLET | Freq: Every day | ORAL | 1 refills | Status: DC

## 2022-04-12 MED ORDER — ATORVASTATIN CALCIUM 20 MG PO TABS: 20.0000 mg | ORAL_TABLET | Freq: Every day | ORAL | 1 refills | Status: DC

## 2022-04-12 NOTE — Patient Instructions (Signed)

## 2022-04-12 NOTE — Progress Notes (Signed)
I,Tianna Badgett,acting as a Education administrator for [employer], FNP.,have documented all relevant documentation on the behalf of [name], FNP,as directed by  [name], FNP while in the presence of [name], [place].   Subjective:     Patient ID: Priscilla Thornton , female    DOB: September 24, 1945 , 76 y.o.   MRN: [id number]   No chief complaint on file.   HPI  Patient is here for physical exam. She has been to Dr. Aryl (Arthritis) and Dr. Chalmers Cater (next month).    Hypertension This is a chronic problem. The current episode started more than 1 year ago. The problem is controlled. Pertinent negatives include no anxiety, chest pain, headaches, palpitations or shortness of breath. Risk factors for coronary artery disease include dyslipidemia and sedentary lifestyle. There are no compliance problems.  There is no history of kidney disease. There is no history of chronic renal disease.     Past Medical History:  Diagnosis Date  . Arthritis   . Hyperlipidemia   . Hypertension      Family History  Problem Relation Age of Onset  . Diabetes Mother   . Heart disease Father   . Colon cancer Neg Hx   . Esophageal cancer Neg Hx   . Rectal cancer Neg Hx   . Stomach cancer Neg Hx      Current Outpatient Medications:  .  acetaminophen (TYLENOL) 500 MG tablet, Take 500 mg by mouth every 6 (six) hours as needed., Disp: , Rfl:  .  aspirin 81 MG tablet, Take 81 mg by mouth daily., Disp: , Rfl:  .  fish oil-omega-3 fatty acids 1000 MG capsule, Take 1 g by mouth daily., Disp: , Rfl:  .  folic acid (FOLVITE) 1 MG tablet, Take 1 mg by mouth daily., Disp: , Rfl:  .  hydrochlorothiazide (HYDRODIURIL) 12.5 MG tablet, Take 1 tablet (12.5 mg total) by mouth daily., Disp: 90 tablet, Rfl: 1 .  leflunomide (ARAVA) 10 MG tablet, Take 10 mg by mouth daily., Disp: , Rfl:  .  methimazole (TAPAZOLE) 5 MG tablet, Take 5 mg by mouth daily., Disp: , Rfl:  .  Multiple Vitamins-Calcium (ONE-A-DAY WOMENS FORMULA PO), Take 1  tablet by mouth daily., Disp: , Rfl:  .  PEG-KCl-NaCl-NaSulf-Na Asc-C (PLENVU) 140 g SOLR, Take 140 g by mouth as directed. Manufacturer's coupon Universal coupon code:BIN: P2366821; GROUP: [id number]; PCN: [id number]; ID: [id number]; PAY NO MORE $50, Disp: 1 each, Rfl: 0 .  potassium chloride (KLOR-CON M10) 10 MEQ tablet, Take 1 tablet (10 mEq total) by mouth daily. with food, Disp: 90 tablet, Rfl: 1 .  atorvastatin (LIPITOR) 20 MG tablet, Take 1 tablet (20 mg total) by mouth daily., Disp: 90 tablet, Rfl: 1 .  losartan (COZAAR) 100 MG tablet, Take 1 tablet (100 mg total) by mouth daily., Disp: 90 tablet, Rfl: 1   No Known Allergies    The patient states she post menopausal status.  No LMP recorded. Patient is postmenopausal.. Negative for Dysmenorrhea and Negative for Menorrhagia. Negative for: breast discharge, breast lump(s), breast pain and breast self exam. Associated symptoms include abnormal vaginal bleeding. Pertinent negatives include abnormal bleeding (hematology), anxiety, decreased libido, depression, difficulty falling sleep, dyspareunia, history of infertility, nocturia, sexual dysfunction, sleep disturbances, urinary incontinence, urinary urgency, vaginal discharge and vaginal itching. Diet regular.  The patient states her exercise level is minimal - mostly gets her activity at work walking the halls and stairs   The patient's tobacco use is:  Social History  Tobacco Use  Smoking Status Former  . Packs/day: 0.20  . Types: Cigarettes  . Quit date: 2016  . Years since quitting: 7.7  Smokeless Tobacco Never  Tobacco Comments   2-3 cigaretts a day   She has been exposed to passive smoke. The patient's alcohol use is:  Social History   Substance and Sexual Activity  Alcohol Use No     Review of Systems  Constitutional: Negative.   HENT: Negative.    Eyes: Negative.   Respiratory: Negative.  Negative for shortness of breath.   Cardiovascular: Negative.  Negative for chest pain  and palpitations.  Gastrointestinal: Negative.   Endocrine: Negative.   Genitourinary: Negative.   Musculoskeletal: Negative.   Skin: Negative.   Allergic/Immunologic: Negative.   Neurological: Negative.  Negative for headaches.  Hematological: Negative.   Psychiatric/Behavioral: Negative.       Today's Vitals   04/12/22 0833  BP: 110/72  Pulse: 90  Temp: 98 F (36.7 C)  TempSrc: Oral  Weight: 133 lb 6.4 oz (60.5 kg)  Height: '5\' 5"'$  (1.651 m)   Body mass index is 22.2 kg/m.   Objective:  Physical Exam Vitals reviewed.  Constitutional:      General: She is not in acute distress.    Appearance: Normal appearance. She is well-developed.     Comments: Thin appearance  HENT:     Head: Normocephalic and atraumatic.     Right Ear: Hearing, tympanic membrane, ear canal and external ear normal. There is no impacted cerumen.     Left Ear: Hearing, tympanic membrane, ear canal and external ear normal. There is no impacted cerumen.     Nose:     Comments: Deferred - masked    Mouth/Throat:     Comments: Deferred - masked Eyes:     General: Lids are normal.     Conjunctiva/sclera: Conjunctivae normal.     Pupils: Pupils are equal, round, and reactive to light.     Funduscopic exam:    Right eye: No papilledema.        Left eye: No papilledema.  Neck:     Thyroid: No thyroid mass.     Vascular: No carotid bruit.  Cardiovascular:     Rate and Rhythm: Normal rate and regular rhythm.     Pulses: Normal pulses.     Heart sounds: Normal heart sounds. No murmur heard. Pulmonary:     Effort: Pulmonary effort is normal. No respiratory distress.     Breath sounds: Normal breath sounds. No wheezing.  Abdominal:     General: Abdomen is flat. Bowel sounds are normal. There is no distension.     Palpations: Abdomen is soft.     Tenderness: There is no abdominal tenderness.  Musculoskeletal:        General: No swelling or tenderness. Normal range of motion.     Cervical back: Full  passive range of motion without pain, normal range of motion and neck supple.     Right lower leg: No edema.     Left lower leg: No edema.  Skin:    General: Skin is warm and dry.     Capillary Refill: Capillary refill takes less than 2 seconds.     Findings: No lesion (left posterior back with).     Comments: Large soft tissue lump to right upper back  Neurological:     General: No focal deficit present.     Mental Status: She is alert and oriented to person, place,  and time.     Cranial Nerves: No cranial nerve deficit.     Sensory: No sensory deficit.  Psychiatric:        Mood and Affect: Mood normal.        Behavior: Behavior normal.        Thought Content: Thought content normal.        Judgment: Judgment normal.        Assessment And Plan:     1. Health maintenance examination Behavior modifications discussed and diet history reviewed.   Pt will continue to exercise regularly and modify diet with low GI, plant based foods and decrease intake of processed foods.  Recommend intake of daily multivitamin, Vitamin D, and calcium.  Recommend mammogram and colonoscopy for preventive screenings, as well as recommend immunizations that include influenza, TDAP, and Shingles  2. Benign hypertension with CKD (chronic kidney disease) stage III (HCC) Comments: Blood pressure is well controlled. Continue current medications.   - EKG 12-Lead - Microalbumin / Creatinine Urine Ratio - POCT Urinalysis Dipstick (81002) - Lipid panel - losartan (COZAAR) 100 MG tablet; Take 1 tablet (100 mg total) by mouth daily.  Dispense: 90 tablet; Refill: 1  3. Mixed hyperlipidemia Comments: Stable, continue statin. Tolerating well.  - atorvastatin (LIPITOR) 20 MG tablet; Take 1 tablet (20 mg total) by mouth daily.  Dispense: 90 tablet; Refill: 1  4. Hyperthyroidism Comments: Continue current medications. Continue f/u with Dr Chalmers Cater, sent thyroid level results to Dr. Chalmers Cater - Thyroid Panel With TSH  5.  Arthritis Comments: Continue f/u with Dr. Aryl - abstract labs done on 9/20  6. Abnormal EKG Comments: Irregular rhythm with atrial fib in V1 leads, will refer to Cardiology for further evaluation. Continue taking aspirin daily - Ambulatory referral to Cardiology  7. Influenza vaccination declined Patient declined influenza vaccination at this time. Patient is aware that influenza vaccine prevents illness in 70% of healthy people, and reduces hospitalizations to 30-70% in elderly. This vaccine is recommended annually. Education has been provided regarding the importance of this vaccine but patient still declined. Advised may receive this vaccine at local pharmacy or Health Dept.or vaccine clinic. Aware to provide a copy of the vaccination record if obtained from local pharmacy or Health Dept.  Pt is willing to accept risk associated with refusing vaccination.  8. Herpes zoster vaccination declined Declines shingrix, educated on disease process and is aware if he changes his mind to notify office   9. Tetanus, diphtheria, and acellular pertussis (Tdap) vaccination declined Tetanus vaccination status reviewed: greater than 10 years, declined getting today.  10. Pneumococcal vaccination declined  11. Other long term (current) drug therapy - Iron, TIBC and Ferritin Panel  12. Mass of skin of back Comments: No significant changes.  13. Atypical nevi Comments: left posterior back with irregular borders, she will consider a biopsy if needed.    Patient was given opportunity to ask questions. Patient verbalized understanding of the plan and was able to repeat key elements of the plan. All questions were answered to their satisfaction.   [name], FNP   I, [name], FNP, have reviewed all documentation for this visit. The documentation on 04/12/22 for the exam, diagnosis, procedures, and orders are all accurate and complete.   THE PATIENT IS ENCOURAGED TO PRACTICE SOCIAL DISTANCING  DUE TO THE COVID-19 PANDEMIC.

## 2022-04-13 LAB — LIPID PANEL
Chol/HDL Ratio: 3.2 ratio (ref 0.0–4.4)
Cholesterol, Total: 146 mg/dL (ref 100–199)
HDL: 45 mg/dL (ref >39)
LDL Chol Calc (NIH): 79 mg/dL (ref 0–99)
Triglycerides: 121 mg/dL (ref 0–149)
VLDL Cholesterol Cal: 22 mg/dL (ref 5–40)

## 2022-04-13 LAB — THYROID PANEL WITH TSH
Free Thyroxine Index: 1.9 (ref 1.2–4.9)
T3 Uptake Ratio: 25 % (ref 24–39)
T4, Total: 7.7 ug/dL (ref 4.5–12.0)
TSH: 0.218 u[IU]/mL — ABNORMAL LOW (ref 0.450–4.500)

## 2022-04-13 LAB — IRON,TIBC AND FERRITIN PANEL
Ferritin: 506 ng/mL — ABNORMAL HIGH (ref 15–150)
Iron Saturation: 18 % (ref 15–55)
Iron: 63 ug/dL (ref 27–139)
Total Iron Binding Capacity: 344 ug/dL (ref 250–450)
UIBC: 281 ug/dL (ref 118–369)

## 2022-04-13 LAB — MICROALBUMIN / CREATININE URINE RATIO
Creatinine, Urine: 77 mg/dL
Microalb/Creat Ratio: 22 mg/g creat (ref 0–29)
Microalbumin, Urine: 16.6 ug/mL

## 2022-05-02 ENCOUNTER — Encounter: Payer: Self-pay | Admitting: Cardiovascular Disease

## 2022-06-21 DIAGNOSIS — E0520 Thyrotoxicosis with toxic multinodular goiter without thyrotoxic crisis or storm: Secondary | ICD-10-CM | POA: Diagnosis not present

## 2022-06-21 DIAGNOSIS — I10 Essential (primary) hypertension: Secondary | ICD-10-CM | POA: Diagnosis not present

## 2022-06-27 DIAGNOSIS — M549 Dorsalgia, unspecified: Secondary | ICD-10-CM | POA: Diagnosis not present

## 2022-06-27 DIAGNOSIS — E0590 Thyrotoxicosis, unspecified without thyrotoxic crisis or storm: Secondary | ICD-10-CM | POA: Diagnosis not present

## 2022-06-27 DIAGNOSIS — M0589 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: Secondary | ICD-10-CM | POA: Diagnosis not present

## 2022-06-27 DIAGNOSIS — D72819 Decreased white blood cell count, unspecified: Secondary | ICD-10-CM | POA: Diagnosis not present

## 2022-06-27 DIAGNOSIS — N1831 Chronic kidney disease, stage 3a: Secondary | ICD-10-CM | POA: Diagnosis not present

## 2022-06-27 DIAGNOSIS — Z79899 Other long term (current) drug therapy: Secondary | ICD-10-CM | POA: Diagnosis not present

## 2022-08-09 ENCOUNTER — Ambulatory Visit (INDEPENDENT_AMBULATORY_CARE_PROVIDER_SITE_OTHER): Payer: Medicare PPO | Admitting: Nurse Practitioner

## 2022-08-09 ENCOUNTER — Encounter: Payer: Self-pay | Admitting: Nurse Practitioner

## 2022-08-09 VITALS — BP 122/62 | HR 96 | Temp 98.2°F | Ht 65.0 in | Wt 139.0 lb

## 2022-08-09 DIAGNOSIS — E782 Mixed hyperlipidemia: Secondary | ICD-10-CM | POA: Diagnosis not present

## 2022-08-09 DIAGNOSIS — N183 Chronic kidney disease, stage 3 unspecified: Secondary | ICD-10-CM

## 2022-08-09 DIAGNOSIS — E0590 Thyrotoxicosis, unspecified without thyrotoxic crisis or storm: Secondary | ICD-10-CM | POA: Diagnosis not present

## 2022-08-09 DIAGNOSIS — Z2821 Immunization not carried out because of patient refusal: Secondary | ICD-10-CM

## 2022-08-09 DIAGNOSIS — I129 Hypertensive chronic kidney disease with stage 1 through stage 4 chronic kidney disease, or unspecified chronic kidney disease: Secondary | ICD-10-CM

## 2022-08-09 MED ORDER — POTASSIUM CHLORIDE CRYS ER 10 MEQ PO TBCR: 10.0000 meq | EXTENDED_RELEASE_TABLET | Freq: Every day | ORAL | 1 refills | Status: DC

## 2022-08-09 MED ORDER — HYDROCHLOROTHIAZIDE 12.5 MG PO TABS: 12.5000 mg | ORAL_TABLET | Freq: Every day | ORAL | 1 refills | Status: DC

## 2022-08-09 NOTE — Patient Instructions (Signed)
Hypertension, Adult High blood pressure (hypertension) is when the force of blood pumping through the arteries is too strong. The arteries are the blood vessels that carry blood from the heart throughout the body. Hypertension forces the heart to work harder to pump blood and may cause arteries to become narrow or stiff. Untreated or uncontrolled hypertension can lead to a heart attack, heart failure, a stroke, kidney disease, and other problems. A blood pressure reading consists of a higher number over a lower number. Ideally, your blood pressure should be below 120/80. The first ("top") number is called the systolic pressure. It is a measure of the pressure in your arteries as your heart beats. The second ("bottom") number is called the diastolic pressure. It is a measure of the pressure in your arteries as the heart relaxes. What are the causes? The exact cause of this condition is not known. There are some conditions that result in high blood pressure. What increases the risk? Certain factors may make you more likely to develop high blood pressure. Some of these risk factors are under your control, including: Smoking. Not getting enough exercise or physical activity. Being overweight. Having too much fat, sugar, calories, or salt (sodium) in your diet. Drinking too much alcohol. Other risk factors include: Having a personal history of heart disease, diabetes, high cholesterol, or kidney disease. Stress. Having a family history of high blood pressure and high cholesterol. Having obstructive sleep apnea. Age. The risk increases with age. What are the signs or symptoms? High blood pressure may not cause symptoms. Very high blood pressure (hypertensive crisis) may cause: Headache. Fast or irregular heartbeats (palpitations). Shortness of breath. Nosebleed. Nausea and vomiting. Vision changes. Severe chest pain, dizziness, and seizures. How is this diagnosed? This condition is diagnosed by  measuring your blood pressure while you are seated, with your arm resting on a flat surface, your legs uncrossed, and your feet flat on the floor. The cuff of the blood pressure monitor will be placed directly against the skin of your upper arm at the level of your heart. Blood pressure should be measured at least twice using the same arm. Certain conditions can cause a difference in blood pressure between your right and left arms. If you have a high blood pressure reading during one visit or you have normal blood pressure with other risk factors, you may be asked to: Return on a different day to have your blood pressure checked again. Monitor your blood pressure at home for 1 week or longer. If you are diagnosed with hypertension, you may have other blood or imaging tests to help your health care provider understand your overall risk for other conditions. How is this treated? This condition is treated by making healthy lifestyle changes, such as eating healthy foods, exercising more, and reducing your alcohol intake. You may be referred for counseling on a healthy diet and physical activity. Your health care provider may prescribe medicine if lifestyle changes are not enough to get your blood pressure under control and if: Your systolic blood pressure is above 130. Your diastolic blood pressure is above 80. Your personal target blood pressure may vary depending on your medical conditions, your age, and other factors. Follow these instructions at home: Eating and drinking  Eat a diet that is high in fiber and potassium, and low in sodium, added sugar, and fat. An example of this eating plan is called the DASH diet. DASH stands for Dietary Approaches to Stop Hypertension. To eat this way: Eat   plenty of fresh fruits and vegetables. Try to fill one half of your plate at each meal with fruits and vegetables. Eat whole grains, such as whole-wheat pasta, brown rice, or whole-grain bread. Fill about one  fourth of your plate with whole grains. Eat or drink low-fat dairy products, such as skim milk or low-fat yogurt. Avoid fatty cuts of meat, processed or cured meats, and poultry with skin. Fill about one fourth of your plate with lean proteins, such as fish, chicken without skin, beans, eggs, or tofu. Avoid pre-made and processed foods. These tend to be higher in sodium, added sugar, and fat. Reduce your daily sodium intake. Many people with hypertension should eat less than 1,500 mg of sodium a day. Do not drink alcohol if: Your health care provider tells you not to drink. You are pregnant, may be pregnant, or are planning to become pregnant. If you drink alcohol: Limit how much you have to: 0-1 drink a day for women. 0-2 drinks a day for men. Know how much alcohol is in your drink. In the [place] one drink equals one 12 oz bottle of beer (355 mL), one 5 oz glass of wine (148 mL), or one 1 oz glass of hard liquor (44 mL). Lifestyle  Work with your health care provider to maintain a healthy body weight or to lose weight. Ask what an ideal weight is for you. Get at least 30 minutes of exercise that causes your heart to beat faster (aerobic exercise) most days of the week. Activities may include walking, swimming, or biking. Include exercise to strengthen your muscles (resistance exercise), such as Pilates or lifting weights, as part of your weekly exercise routine. Try to do these types of exercises for 30 minutes at least 3 days a week. Do not use any products that contain nicotine or tobacco. These products include cigarettes, chewing tobacco, and vaping devices, such as e-cigarettes. If you need help quitting, ask your health care provider. Monitor your blood pressure at home as told by your health care provider. Keep all follow-up visits. This is important. Medicines Take over-the-counter and prescription medicines only as told by your health care provider. Follow directions carefully. Blood  pressure medicines must be taken as prescribed. Do not skip doses of blood pressure medicine. Doing this puts you at risk for problems and can make the medicine less effective. Ask your health care provider about side effects or reactions to medicines that you should watch for. Contact a health care provider if you: Think you are having a reaction to a medicine you are taking. Have headaches that keep coming back (recurring). Feel dizzy. Have swelling in your ankles. Have trouble with your vision. Get help right away if you: Develop a severe headache or confusion. Have unusual weakness or numbness. Feel faint. Have severe pain in your chest or abdomen. Vomit repeatedly. Have trouble breathing. These symptoms may be an emergency. Get help right away. Call 911. Do not wait to see if the symptoms will go away. Do not drive yourself to the hospital. Summary Hypertension is when the force of blood pumping through your arteries is too strong. If this condition is not controlled, it may put you at risk for serious complications. Your personal target blood pressure may vary depending on your medical conditions, your age, and other factors. For most people, a normal blood pressure is less than 120/80. Hypertension is treated with lifestyle changes, medicines, or a combination of both. Lifestyle changes include losing weight, eating a healthy,   low-sodium diet, exercising more, and limiting alcohol. This information is not intended to replace advice given to you by your health care provider. Make sure you discuss any questions you have with your health care provider. Document Revised: 05/03/2021 Document Reviewed: 05/03/2021 Elsevier Patient Education  2023 [employer] Inc[employer]  

## 2022-08-09 NOTE — Progress Notes (Signed)
I,Tianna Badgett,acting as a Education administrator for [employer], FNP.,have documented all relevant documentation on the behalf of [name], FNP,as directed by  [name], FNP while in the presence of [name], [place].  Subjective:     Patient ID: Priscilla Thornton , female    DOB: 1946/06/28 , 77 y.o.   MRN: [id number]   Chief Complaint  Patient presents with   Hypertension    HPI  Patient here for a blood pressure f/u.  She continues to be seen by Dr. Chalmers Cater for her thyroid disease.  Wt Readings from Last 3 Encounters: 08/09/22 : 139 lb (63 kg) 04/12/22 : 133 lb 6.4 oz (60.5 kg) 01/25/22 : 130 lb (59 kg)    Hypertension This is a chronic problem. The current episode started more than 1 year ago. The problem is unchanged. The problem is controlled. Pertinent negatives include no chest pain, headaches, palpitations or shortness of breath. There are no associated agents to hypertension. There are no known risk factors for coronary artery disease. Past treatments include angiotensin blockers. The current treatment provides moderate improvement. There are no compliance problems.  There is no history of angina, kidney disease or CVA. Identifiable causes of hypertension include a thyroid problem. There is no history of chronic renal disease.     Past Medical History:  Diagnosis Date   Arthritis    Hyperlipidemia    Hypertension      Family History  Problem Relation Age of Onset   Diabetes Mother    Heart disease Father    Colon cancer Neg Hx    Esophageal cancer Neg Hx    Rectal cancer Neg Hx    Stomach cancer Neg Hx      Current Outpatient Medications:    acetaminophen (TYLENOL) 500 MG tablet, Take 500 mg by mouth every 6 (six) hours as needed., Disp: , Rfl:    aspirin 81 MG tablet, Take 81 mg by mouth daily., Disp: , Rfl:    atorvastatin (LIPITOR) 20 MG tablet, Take 1 tablet (20 mg total) by mouth daily., Disp: 90 tablet, Rfl: 1   fish oil-omega-3 fatty acids 1000 MG capsule,  Take 1 g by mouth daily., Disp: , Rfl:    folic acid (FOLVITE) 1 MG tablet, Take 1 mg by mouth daily., Disp: , Rfl:    hydrochlorothiazide (HYDRODIURIL) 12.5 MG tablet, Take 1 tablet (12.5 mg total) by mouth daily., Disp: 90 tablet, Rfl: 1   leflunomide (ARAVA) 10 MG tablet, Take 10 mg by mouth daily., Disp: , Rfl:    losartan (COZAAR) 100 MG tablet, Take 1 tablet (100 mg total) by mouth daily., Disp: 90 tablet, Rfl: 1   methimazole (TAPAZOLE) 5 MG tablet, Take 5 mg by mouth daily., Disp: , Rfl:    Multiple Vitamins-Calcium (ONE-A-DAY WOMENS FORMULA PO), Take 1 tablet by mouth daily., Disp: , Rfl:    PEG-KCl-NaCl-NaSulf-Na Asc-C (PLENVU) 140 g SOLR, Take 140 g by mouth as directed. Manufacturer's coupon Universal coupon code:BIN: [id number]; GROUPSE:[id number]; PCN: [id number]; IDCY:[id number]; PAY NO MORE $50, Disp: 1 each, Rfl: 0   potassium chloride (KLOR-CON M10) 10 MEQ tablet, Take 1 tablet (10 mEq total) by mouth daily. with food, Disp: 90 tablet, Rfl: 1   No Known Allergies   Review of Systems  Constitutional: Negative.   Respiratory: Negative.  Negative for shortness of breath.   Cardiovascular: Negative.  Negative for chest pain and palpitations.  Gastrointestinal: Negative.   Neurological: Negative.  Negative for headaches.  Today's Vitals   08/09/22 1450  BP: 122/62  Pulse: 96  Temp: 98.2 F (36.8 C)  TempSrc: Oral  Weight: 139 lb (63 kg)  Height: 5' 5"$  (1.651 m)  PainSc: 0-No pain   Body mass index is 23.13 kg/m.   Objective:  Physical Exam Vitals reviewed.  Constitutional:      General: She is not in acute distress.    Appearance: Normal appearance.  Cardiovascular:     Rate and Rhythm: Normal rate and regular rhythm.     Pulses: Normal pulses.     Heart sounds: Normal heart sounds. No murmur heard. Pulmonary:     Effort: Pulmonary effort is normal. No respiratory distress.     Breath sounds: Normal breath sounds. No wheezing.  Musculoskeletal:     Cervical back:  Normal range of motion and neck supple.  Skin:    General: Skin is warm and dry.     Capillary Refill: Capillary refill takes less than 2 seconds.  Neurological:     General: No focal deficit present.     Mental Status: She is alert and oriented to person, place, and time.     Cranial Nerves: No cranial nerve deficit.     Motor: No weakness.  Psychiatric:        Mood and Affect: Mood normal.        Behavior: Behavior normal.        Thought Content: Thought content normal.        Judgment: Judgment normal.         Assessment And Plan:     1. Benign hypertension with CKD (chronic kidney disease) stage III (HCC) Comments: Blood pressure is well-controlled.  Continue current medications - potassium chloride (KLOR-CON M10) 10 MEQ tablet; Take 1 tablet (10 mEq total) by mouth daily. with food  Dispense: 90 tablet; Refill: 1 - hydrochlorothiazide (HYDRODIURIL) 12.5 MG tablet; Take 1 tablet (12.5 mg total) by mouth daily.  Dispense: 90 tablet; Refill: 1 - BMP8+eGFR  2. Mixed hyperlipidemia Comments: Cholesterol is stable.  Continue statin tolerating well  3. Hyperthyroidism Comments: Continue follow-up with Dr. Chalmers Cater  4. Influenza vaccination declined Patient declined influenza vaccination at this time. Patient is aware that influenza vaccine prevents illness in 70% of healthy people, and reduces hospitalizations to 30-70% in elderly. This vaccine is recommended annually. Education has been provided regarding the importance of this vaccine but patient still declined. Advised may receive this vaccine at local pharmacy or Health Dept.or vaccine clinic. Aware to provide a copy of the vaccination record if obtained from local pharmacy or Health Dept.  Pt is willing to accept risk associated with refusing vaccination.  5. Pneumococcal vaccination declined Discussed importance of preventive maintenance to avoid risk for pneumonia due to age and chronic health problems  6. Herpes zoster  vaccination declined Declines shingrix, educated on disease process and is aware if he changes his mind to notify office    Patient was given opportunity to ask questions. Patient verbalized understanding of the plan and was able to repeat key elements of the plan. All questions were answered to their satisfaction.  [name], FNP   I, [name], FNP, have reviewed all documentation for this visit. The documentation on 08/09/22 for the exam, diagnosis, procedures, and orders are all accurate and complete.   IF YOU HAVE BEEN REFERRED TO A SPECIALIST, IT MAY TAKE 1-2 WEEKS TO SCHEDULE/PROCESS THE REFERRAL. IF YOU HAVE NOT HEARD FROM US/SPECIALIST IN TWO WEEKS, PLEASE GIVE  [place] A CALL AT [phone].   THE PATIENT IS ENCOURAGED TO PRACTICE SOCIAL DISTANCING DUE TO THE COVID-19 PANDEMIC.

## 2022-10-16 DIAGNOSIS — M0589 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: Secondary | ICD-10-CM | POA: Diagnosis not present

## 2022-10-16 DIAGNOSIS — Z79899 Other long term (current) drug therapy: Secondary | ICD-10-CM | POA: Diagnosis not present

## 2022-10-16 DIAGNOSIS — N1831 Chronic kidney disease, stage 3a: Secondary | ICD-10-CM | POA: Diagnosis not present

## 2022-10-16 DIAGNOSIS — M549 Dorsalgia, unspecified: Secondary | ICD-10-CM | POA: Diagnosis not present

## 2022-10-16 DIAGNOSIS — D72819 Decreased white blood cell count, unspecified: Secondary | ICD-10-CM | POA: Diagnosis not present

## 2022-10-16 DIAGNOSIS — E0590 Thyrotoxicosis, unspecified without thyrotoxic crisis or storm: Secondary | ICD-10-CM | POA: Diagnosis not present

## 2022-12-11 ENCOUNTER — Ambulatory Visit: Payer: Medicare PPO | Admitting: Nurse Practitioner

## 2022-12-11 ENCOUNTER — Encounter: Payer: Self-pay | Admitting: Nurse Practitioner

## 2022-12-11 VITALS — BP 120/72 | HR 84 | Temp 99.0°F | Ht 65.0 in | Wt 135.2 lb

## 2022-12-11 DIAGNOSIS — N183 Chronic kidney disease, stage 3 unspecified: Secondary | ICD-10-CM | POA: Diagnosis not present

## 2022-12-11 DIAGNOSIS — Z79899 Other long term (current) drug therapy: Secondary | ICD-10-CM | POA: Diagnosis not present

## 2022-12-11 DIAGNOSIS — E782 Mixed hyperlipidemia: Secondary | ICD-10-CM | POA: Insufficient documentation

## 2022-12-11 DIAGNOSIS — M0589 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: Secondary | ICD-10-CM | POA: Insufficient documentation

## 2022-12-11 DIAGNOSIS — E0590 Thyrotoxicosis, unspecified without thyrotoxic crisis or storm: Secondary | ICD-10-CM

## 2022-12-11 DIAGNOSIS — I129 Hypertensive chronic kidney disease with stage 1 through stage 4 chronic kidney disease, or unspecified chronic kidney disease: Secondary | ICD-10-CM | POA: Diagnosis not present

## 2022-12-11 DIAGNOSIS — Z2821 Immunization not carried out because of patient refusal: Secondary | ICD-10-CM | POA: Diagnosis not present

## 2022-12-11 NOTE — Patient Instructions (Signed)
Hypertension, Adult Hypertension is another name for high blood pressure. High blood pressure forces your heart to work harder to pump blood. This can cause problems over time. There are two numbers in a blood pressure reading. There is a top number (systolic) over a bottom number (diastolic). It is best to have a blood pressure that is below 120/80. What are the causes? The cause of this condition is not known. Some other conditions can lead to high blood pressure. What increases the risk? Some lifestyle factors can make you more likely to develop high blood pressure: Smoking. Not getting enough exercise or physical activity. Being overweight. Having too much fat, sugar, calories, or salt (sodium) in your diet. Drinking too much alcohol. Other risk factors include: Having any of these conditions: Heart disease. Diabetes. High cholesterol. Kidney disease. Obstructive sleep apnea. Having a family history of high blood pressure and high cholesterol. Age. The risk increases with age. Stress. What are the signs or symptoms? High blood pressure may not cause symptoms. Very high blood pressure (hypertensive crisis) may cause: Headache. Fast or uneven heartbeats (palpitations). Shortness of breath. Nosebleed. Vomiting or feeling like you may vomit (nauseous). Changes in how you see. Very bad chest pain. Feeling dizzy. Seizures. How is this treated? This condition is treated by making healthy lifestyle changes, such as: Eating healthy foods. Exercising more. Drinking less alcohol. Your doctor may prescribe medicine if lifestyle changes do not help enough and if: Your top number is above 130. Your bottom number is above 80. Your personal target blood pressure may vary. Follow these instructions at home: Eating and drinking  If told, follow the DASH eating plan. To follow this plan: Fill one half of your plate at each meal with fruits and vegetables. Fill one fourth of your plate  at each meal with whole grains. Whole grains include whole-wheat pasta, brown rice, and whole-grain bread. Eat or drink low-fat dairy products, such as skim milk or low-fat yogurt. Fill one fourth of your plate at each meal with low-fat (lean) proteins. Low-fat proteins include fish, chicken without skin, eggs, beans, and tofu. Avoid fatty meat, cured and processed meat, or chicken with skin. Avoid pre-made or processed food. Limit the amount of salt in your diet to less than 1,500 mg each day. Do not drink alcohol if: Your doctor tells you not to drink. You are pregnant, may be pregnant, or are planning to become pregnant. If you drink alcohol: Limit how much you have to: 0-1 drink a day for women. 0-2 drinks a day for men. Know how much alcohol is in your drink. In the [place] one drink equals one 12 oz bottle of beer (355 mL), one 5 oz glass of wine (148 mL), or one 1 oz glass of hard liquor (44 mL). Lifestyle  Work with your doctor to stay at a healthy weight or to lose weight. Ask your doctor what the best weight is for you. Get at least 30 minutes of exercise that causes your heart to beat faster (aerobic exercise) most days of the week. This may include walking, swimming, or biking. Get at least 30 minutes of exercise that strengthens your muscles (resistance exercise) at least 3 days a week. This may include lifting weights or doing Pilates. Do not smoke or use any products that contain nicotine or tobacco. If you need help quitting, ask your doctor. Check your blood pressure at home as told by your doctor. Keep all follow-up visits. Medicines Take over-the-counter and prescription medicines   only as told by your doctor. Follow directions carefully. Do not skip doses of blood pressure medicine. The medicine does not work as well if you skip doses. Skipping doses also puts you at risk for problems. Ask your doctor about side effects or reactions to medicines that you should watch  for. Contact a doctor if: You think you are having a reaction to the medicine you are taking. You have headaches that keep coming back. You feel dizzy. You have swelling in your ankles. You have trouble with your vision. Get help right away if: You get a very bad headache. You start to feel mixed up (confused). You feel weak or numb. You feel faint. You have very bad pain in your: Chest. Belly (abdomen). You vomit more than once. You have trouble breathing. These symptoms may be an emergency. Get help right away. Call 911. Do not wait to see if the symptoms will go away. Do not drive yourself to the hospital. Summary Hypertension is another name for high blood pressure. High blood pressure forces your heart to work harder to pump blood. For most people, a normal blood pressure is less than 120/80. Making healthy choices can help lower blood pressure. If your blood pressure does not get lower with healthy choices, you may need to take medicine. This information is not intended to replace advice given to you by your health care provider. Make sure you discuss any questions you have with your health care provider. Document Revised: 04/14/2021 Document Reviewed: 04/14/2021 Elsevier Patient Education  2024 [employer]  

## 2022-12-11 NOTE — Progress Notes (Signed)
Priscilla Thornton,acting as a Neurosurgeon for Priscilla Felts, FNP.,have documented all relevant documentation on the behalf of Priscilla Felts, FNP,as directed by  Priscilla Felts, FNP while in the presence of Priscilla Felts, FNP.    Subjective:     Patient ID: Priscilla Thornton , female    DOB: 1946/04/21 , 77 y.o.   MRN: [id number]   Chief Complaint  Patient presents with   Hypertension    HPI  Patient presents today for bp,tsh, and chol check.  Patient reports compliance with medications and has no other concerns today. She is due to see Dr. Deanne Coffer and Dr. Talmage Nap this month.   BP Readings from Last 3 Encounters: 12/11/22 : 120/72 08/09/22 : 122/62 04/12/22 : 110/72       Past Medical History:  Diagnosis Date   Arthritis    Hyperlipidemia    Hypertension      Family History  Problem Relation Age of Onset   Diabetes Mother    Heart disease Father    Colon cancer Neg Hx    Esophageal cancer Neg Hx    Rectal cancer Neg Hx    Stomach cancer Neg Hx      Current Outpatient Medications:    acetaminophen (TYLENOL) 500 MG tablet, Take 500 mg by mouth every 6 (six) hours as needed., Disp: , Rfl:    aspirin 81 MG tablet, Take 81 mg by mouth daily., Disp: , Rfl:    atorvastatin (LIPITOR) 20 MG tablet, Take 1 tablet (20 mg total) by mouth daily., Disp: 90 tablet, Rfl: 1   fish oil-omega-3 fatty acids 1000 MG capsule, Take 1 g by mouth daily., Disp: , Rfl:    folic acid (FOLVITE) 1 MG tablet, Take 1 mg by mouth daily., Disp: , Rfl:    hydrochlorothiazide (HYDRODIURIL) 12.5 MG tablet, Take 1 tablet (12.5 mg total) by mouth daily., Disp: 90 tablet, Rfl: 1   leflunomide (ARAVA) 10 MG tablet, Take 10 mg by mouth daily., Disp: , Rfl:    losartan (COZAAR) 100 MG tablet, Take 1 tablet (100 mg total) by mouth daily., Disp: 90 tablet, Rfl: 1   methimazole (TAPAZOLE) 5 MG tablet, Take 5 mg by mouth daily., Disp: , Rfl:    Multiple Vitamins-Calcium (ONE-A-DAY WOMENS FORMULA PO), Take 1 tablet by mouth  daily., Disp: , Rfl:    PEG-KCl-NaCl-NaSulf-Na Asc-C (PLENVU) 140 g SOLR, Take 140 g by mouth as directed. Manufacturer's coupon Universal coupon code:BIN: [id number]; GROUP: [id number]; PCN: [id number]; ID: [id number]; PAY NO MORE $50, Disp: 1 each, Rfl: 0   potassium chloride (KLOR-CON M10) 10 MEQ tablet, Take 1 tablet (10 mEq total) by mouth daily. with food, Disp: 90 tablet, Rfl: 1   No Known Allergies   Review of Systems  Constitutional: Negative.   Respiratory: Negative.  Negative for shortness of breath.   Cardiovascular: Negative.  Negative for chest pain and palpitations.  Gastrointestinal: Negative.   Neurological: Negative.  Negative for headaches.     Today's Vitals   12/11/22 1102  BP: 120/72  Pulse: 84  Temp: 99 F (37.2 C)  TempSrc: Oral  Weight: 135 lb 3.2 oz (61.3 kg)  Height: 5\' 5"  (1.651 m)  PainSc: 0-No pain   Body mass index is 22.5 kg/m.  Wt Readings from Last 3 Encounters:  12/11/22 135 lb 3.2 oz (61.3 kg)  08/09/22 139 lb (63 kg)  04/12/22 133 lb 6.4 oz (60.5 kg)    The 10-year ASCVD risk score (Arnett DK, et al., 2019)  is: 16.9%   Values used to calculate the score:     Age: 81 years     Sex: Female     Is Non-Hispanic African American: Yes     Diabetic: No     Tobacco smoker: Yes     Systolic Blood Pressure: 120 mmHg     Is BP treated: Yes     HDL Cholesterol: 35 mg/dL     Total Cholesterol: 140 mg/dL  Objective:  Physical Exam Vitals reviewed.  Constitutional:      General: She is not in acute distress.    Appearance: Normal appearance.  Cardiovascular:     Rate and Rhythm: Normal rate and regular rhythm.     Pulses: Normal pulses.     Heart sounds: Normal heart sounds. No murmur heard. Pulmonary:     Effort: Pulmonary effort is normal. No respiratory distress.     Breath sounds: Normal breath sounds. No wheezing.  Musculoskeletal:     Cervical back: Normal range of motion and neck supple.  Skin:    General: Skin is warm and dry.      Capillary Refill: Capillary refill takes less than 2 seconds.  Neurological:     General: No focal deficit present.     Mental Status: She is alert and oriented to person, place, and time.     Cranial Nerves: No cranial nerve deficit.     Motor: No weakness.  Psychiatric:        Mood and Affect: Mood normal.        Behavior: Behavior normal.        Thought Content: Thought content normal.        Judgment: Judgment normal.         Assessment And Plan:     1. Mixed hyperlipidemia Comments: Cholesterol levels are stable, continue statin. Tolerating well. - Basic metabolic panel - Lipid panel - CBC  2. Hyperthyroidism Comments: Continue f/u with Dr. Talmage Nap. Continue current medications. - CBC  3. Benign hypertension with CKD (chronic kidney disease) stage III (HCC) Comments: Blood pressure is well controlled, continue current medications - Basic metabolic panel  4. 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 Deanne Coffer.  5. Herpes zoster vaccination declined Declines shingrix, educated on disease process and is aware if he changes his mind to notify office  6. Other long term (current) drug therapy - CBC    Return for 6 month thyroid check.  Patient was given opportunity to ask questions. Patient verbalized understanding of the plan and was able to repeat key elements of the plan. All questions were answered to their satisfaction.  Priscilla Felts, FNP   I, Priscilla Felts, FNP, have reviewed all documentation for this visit. The documentation on 12/11/22 for the exam, diagnosis, procedures, and orders are all accurate and complete.   IF YOU HAVE BEEN REFERRED TO A SPECIALIST, IT MAY TAKE 1-2 WEEKS TO SCHEDULE/PROCESS THE REFERRAL. IF YOU HAVE NOT HEARD FROM US/SPECIALIST IN TWO WEEKS, PLEASE GIVE [place] A CALL AT [phone].   THE PATIENT IS ENCOURAGED TO PRACTICE SOCIAL DISTANCING DUE TO THE COVID-19 PANDEMIC.

## 2022-12-12 ENCOUNTER — Other Ambulatory Visit: Payer: Self-pay | Admitting: Nurse Practitioner

## 2022-12-12 DIAGNOSIS — N1832 Chronic kidney disease, stage 3b: Secondary | ICD-10-CM

## 2022-12-12 LAB — BASIC METABOLIC PANEL
BUN/Creatinine Ratio: 17 (ref 12–28)
BUN: 22 mg/dL (ref 8–27)
CO2: 25 mmol/L (ref 20–29)
Calcium: 10.1 mg/dL (ref 8.7–10.3)
Chloride: 105 mmol/L (ref 96–106)
Creatinine, Ser: 1.32 mg/dL — ABNORMAL HIGH (ref 0.57–1.00)
Glucose: 110 mg/dL — ABNORMAL HIGH (ref 70–99)
Potassium: 4.3 mmol/L (ref 3.5–5.2)
Sodium: 144 mmol/L (ref 134–144)
eGFR: 42 mL/min/{1.73_m2} — ABNORMAL LOW (ref >59)

## 2022-12-12 LAB — CBC
Hematocrit: 37 % (ref 34.0–46.6)
Hemoglobin: 11.4 g/dL (ref 11.1–15.9)
MCH: 26.3 pg — ABNORMAL LOW (ref 26.6–33.0)
MCHC: 30.8 g/dL — ABNORMAL LOW (ref 31.5–35.7)
MCV: 85 fL (ref 79–97)
Platelets: 351 10*3/uL (ref 150–450)
RBC: 4.34 x10E6/uL (ref 3.77–5.28)
RDW: 12.9 % (ref 11.7–15.4)
WBC: 2.5 10*3/uL — CL (ref 3.4–10.8)

## 2022-12-12 LAB — LIPID PANEL
Chol/HDL Ratio: 4 ratio (ref 0.0–4.4)
Cholesterol, Total: 140 mg/dL (ref 100–199)
HDL: 35 mg/dL — ABNORMAL LOW (ref >39)
LDL Chol Calc (NIH): 81 mg/dL (ref 0–99)
Triglycerides: 134 mg/dL (ref 0–149)
VLDL Cholesterol Cal: 24 mg/dL (ref 5–40)

## 2022-12-18 ENCOUNTER — Other Ambulatory Visit: Payer: Medicare PPO

## 2022-12-18 DIAGNOSIS — N1832 Chronic kidney disease, stage 3b: Secondary | ICD-10-CM

## 2022-12-21 DIAGNOSIS — E0520 Thyrotoxicosis with toxic multinodular goiter without thyrotoxic crisis or storm: Secondary | ICD-10-CM | POA: Diagnosis not present

## 2022-12-21 DIAGNOSIS — I10 Essential (primary) hypertension: Secondary | ICD-10-CM | POA: Diagnosis not present

## 2022-12-25 LAB — MULTIPLE MYELOMA PANEL, SERUM
Albumin SerPl Elph-Mcnc: 3.6 g/dL (ref 2.9–4.4)
Albumin/Glob SerPl: 1.3 (ref 0.7–1.7)
Alpha 1: 0.3 g/dL (ref 0.0–0.4)
Alpha2 Glob SerPl Elph-Mcnc: 0.7 g/dL (ref 0.4–1.0)
B-Globulin SerPl Elph-Mcnc: 0.9 g/dL (ref 0.7–1.3)
Gamma Glob SerPl Elph-Mcnc: 1.1 g/dL (ref 0.4–1.8)
Globulin, Total: 3 g/dL (ref 2.2–3.9)
IgA/Immunoglobulin A, Serum: 112 mg/dL (ref 64–422)
IgG (Immunoglobin G), Serum: 1220 mg/dL (ref 586–1602)
IgM (Immunoglobulin M), Srm: 118 mg/dL (ref 26–217)
Total Protein: 6.6 g/dL (ref 6.0–8.5)

## 2022-12-28 DIAGNOSIS — E0520 Thyrotoxicosis with toxic multinodular goiter without thyrotoxic crisis or storm: Secondary | ICD-10-CM | POA: Diagnosis not present

## 2022-12-28 DIAGNOSIS — I10 Essential (primary) hypertension: Secondary | ICD-10-CM | POA: Diagnosis not present

## 2022-12-28 DIAGNOSIS — N189 Chronic kidney disease, unspecified: Secondary | ICD-10-CM | POA: Diagnosis not present

## 2023-01-10 ENCOUNTER — Other Ambulatory Visit: Payer: Self-pay

## 2023-01-10 DIAGNOSIS — E782 Mixed hyperlipidemia: Secondary | ICD-10-CM

## 2023-01-10 DIAGNOSIS — N183 Chronic kidney disease, stage 3 unspecified: Secondary | ICD-10-CM

## 2023-01-10 MED ORDER — LOSARTAN POTASSIUM 100 MG PO TABS
100.0000 mg | ORAL_TABLET | Freq: Every day | ORAL | 2 refills | Status: DC
Start: 2023-01-10 — End: 2023-10-01

## 2023-01-10 MED ORDER — ATORVASTATIN CALCIUM 20 MG PO TABS
20.0000 mg | ORAL_TABLET | Freq: Every day | ORAL | 2 refills | Status: DC
Start: 2023-01-10 — End: 2023-10-01

## 2023-01-19 DIAGNOSIS — E0590 Thyrotoxicosis, unspecified without thyrotoxic crisis or storm: Secondary | ICD-10-CM | POA: Diagnosis not present

## 2023-01-19 DIAGNOSIS — M549 Dorsalgia, unspecified: Secondary | ICD-10-CM | POA: Diagnosis not present

## 2023-01-19 DIAGNOSIS — D72819 Decreased white blood cell count, unspecified: Secondary | ICD-10-CM | POA: Diagnosis not present

## 2023-01-19 DIAGNOSIS — Z79899 Other long term (current) drug therapy: Secondary | ICD-10-CM | POA: Diagnosis not present

## 2023-01-19 DIAGNOSIS — M0589 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: Secondary | ICD-10-CM | POA: Diagnosis not present

## 2023-01-19 DIAGNOSIS — N1831 Chronic kidney disease, stage 3a: Secondary | ICD-10-CM | POA: Diagnosis not present

## 2023-02-05 ENCOUNTER — Other Ambulatory Visit: Payer: Self-pay | Admitting: Nurse Practitioner

## 2023-02-05 DIAGNOSIS — N183 Chronic kidney disease, stage 3 unspecified: Secondary | ICD-10-CM

## 2023-03-26 ENCOUNTER — Other Ambulatory Visit: Payer: Self-pay

## 2023-03-26 DIAGNOSIS — I129 Hypertensive chronic kidney disease with stage 1 through stage 4 chronic kidney disease, or unspecified chronic kidney disease: Secondary | ICD-10-CM

## 2023-03-26 MED ORDER — HYDROCHLOROTHIAZIDE 12.5 MG PO TABS
12.5000 mg | ORAL_TABLET | Freq: Every day | ORAL | 1 refills | Status: DC
Start: 2023-03-26 — End: 2023-09-12

## 2023-04-19 ENCOUNTER — Encounter: Payer: Medicare PPO | Admitting: Nurse Practitioner

## 2023-04-23 ENCOUNTER — Ambulatory Visit: Payer: Medicare PPO | Admitting: Nurse Practitioner

## 2023-04-23 ENCOUNTER — Encounter: Payer: Self-pay | Admitting: Nurse Practitioner

## 2023-04-23 VITALS — BP 120/76 | HR 91 | Temp 98.7°F | Ht 65.0 in | Wt 134.6 lb

## 2023-04-23 DIAGNOSIS — I129 Hypertensive chronic kidney disease with stage 1 through stage 4 chronic kidney disease, or unspecified chronic kidney disease: Secondary | ICD-10-CM | POA: Diagnosis not present

## 2023-04-23 DIAGNOSIS — R319 Hematuria, unspecified: Secondary | ICD-10-CM | POA: Diagnosis not present

## 2023-04-23 DIAGNOSIS — N390 Urinary tract infection, site not specified: Secondary | ICD-10-CM | POA: Diagnosis not present

## 2023-04-23 DIAGNOSIS — E782 Mixed hyperlipidemia: Secondary | ICD-10-CM | POA: Diagnosis not present

## 2023-04-23 DIAGNOSIS — D72818 Other decreased white blood cell count: Secondary | ICD-10-CM | POA: Diagnosis not present

## 2023-04-23 DIAGNOSIS — Z0000 Encounter for general adult medical examination without abnormal findings: Secondary | ICD-10-CM | POA: Diagnosis not present

## 2023-04-23 DIAGNOSIS — E0590 Thyrotoxicosis, unspecified without thyrotoxic crisis or storm: Secondary | ICD-10-CM | POA: Diagnosis not present

## 2023-04-23 DIAGNOSIS — Z79899 Other long term (current) drug therapy: Secondary | ICD-10-CM

## 2023-04-23 DIAGNOSIS — Z2821 Immunization not carried out because of patient refusal: Secondary | ICD-10-CM

## 2023-04-23 DIAGNOSIS — M0589 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: Secondary | ICD-10-CM

## 2023-04-23 DIAGNOSIS — N183 Chronic kidney disease, stage 3 unspecified: Secondary | ICD-10-CM

## 2023-04-23 DIAGNOSIS — I499 Cardiac arrhythmia, unspecified: Secondary | ICD-10-CM | POA: Diagnosis not present

## 2023-04-23 LAB — POCT URINALYSIS DIP (CLINITEK)
Bilirubin, UA: NEGATIVE
Glucose, UA: NEGATIVE mg/dL
Nitrite, UA: POSITIVE — AB
POC PROTEIN,UA: 30 — AB
Spec Grav, UA: 1.025 (ref 1.010–1.025)
Urobilinogen, UA: 0.2 U/dL
pH, UA: 6 (ref 5.0–8.0)

## 2023-04-23 MED ORDER — NITROFURANTOIN MONOHYD MACRO 100 MG PO CAPS
100.0000 mg | ORAL_CAPSULE | Freq: Two times a day (BID) | ORAL | 0 refills | Status: AC
Start: 2023-04-23 — End: 2023-04-28

## 2023-04-23 NOTE — Progress Notes (Signed)
Madelaine Bhat, CMA,acting as a Neurosurgeon for Arnette Felts, FNP.,have documented all relevant documentation on the behalf of Arnette Felts, FNP,as directed by  Arnette Felts, FNP while in the presence of Arnette Felts, FNP.  Subjective:    Patient ID: Priscilla Thornton , female    DOB: 06-12-1946 , 77 y.o.   MRN: [id number]  Chief Complaint  Patient presents with   Annual Exam    HPI  Patient presents today for HM, Patient reports compliance with medication. Patient denies any chest pain, SOB, or headaches. Patient has no concerns today. She has seen Dr Talmage Nap and Deanne Coffer.   BP Readings from Last 3 Encounters: 04/23/23 : 120/76 12/11/22 : 120/72 08/09/22 : 122/62       Past Medical History:  Diagnosis Date   Arthritis    Hyperlipidemia    Hypertension      Family History  Problem Relation Age of Onset   Diabetes Mother    Heart disease Father    Colon cancer Neg Hx    Esophageal cancer Neg Hx    Rectal cancer Neg Hx    Stomach cancer Neg Hx      Current Outpatient Medications:    acetaminophen (TYLENOL) 500 MG tablet, Take 500 mg by mouth every 6 (six) hours as needed., Disp: , Rfl:    aspirin 81 MG tablet, Take 81 mg by mouth daily., Disp: , Rfl:    atorvastatin (LIPITOR) 20 MG tablet, Take 1 tablet (20 mg total) by mouth daily., Disp: 90 tablet, Rfl: 2   fish oil-omega-3 fatty acids 1000 MG capsule, Take 1 g by mouth daily., Disp: , Rfl:    folic acid (FOLVITE) 1 MG tablet, Take 1 mg by mouth daily., Disp: , Rfl:    hydrochlorothiazide (HYDRODIURIL) 12.5 MG tablet, Take 1 tablet (12.5 mg total) by mouth daily., Disp: 90 tablet, Rfl: 1   leflunomide (ARAVA) 10 MG tablet, Take 10 mg by mouth daily., Disp: , Rfl:    losartan (COZAAR) 100 MG tablet, Take 1 tablet (100 mg total) by mouth daily., Disp: 90 tablet, Rfl: 2   methimazole (TAPAZOLE) 5 MG tablet, Take 5 mg by mouth daily., Disp: , Rfl:    Multiple Vitamins-Calcium (ONE-A-DAY WOMENS FORMULA PO), Take 1 tablet by mouth  daily., Disp: , Rfl:    PEG-KCl-NaCl-NaSulf-Na Asc-C (PLENVU) 140 g SOLR, Take 140 g by mouth as directed. Manufacturer's coupon Universal coupon code:BIN: [id number]; GROUP: [id number]; PCN: [id number]; ID: [id number]; PAY NO MORE $50, Disp: 1 each, Rfl: 0   potassium chloride (KLOR-CON M) 10 MEQ tablet, TAKE 1 TABLET(10 MEQ) BY MOUTH DAILY WITH FOOD, Disp: 90 tablet, Rfl: 1   No Known Allergies    The patient states she uses post menopausal status for birth control. No LMP recorded. Patient is postmenopausal.. Negative for Dysmenorrhea and Negative for Menorrhagia. Negative for: breast discharge, breast lump(s), breast pain and breast self exam. Associated symptoms include abnormal vaginal bleeding. Pertinent negatives include abnormal bleeding (hematology), anxiety, decreased libido, depression, difficulty falling sleep, dyspareunia, history of infertility, nocturia, sexual dysfunction, sleep disturbances, urinary incontinence, urinary urgency, vaginal discharge and vaginal itching. Diet regular. The patient states her exercise level is minimal - she does not walk any extra.   The patient's tobacco use is:  Social History   Tobacco Use  Smoking Status Former   Current packs/day: 0.00   Types: Cigarettes   Quit date: 2016   Years since quitting: 8.8  Smokeless Tobacco Never  Tobacco Comments  2-3 cigaretts a day   She has been exposed to passive smoke. The patient's alcohol use is:  Social History   Substance and Sexual Activity  Alcohol Use No    Review of Systems  Constitutional: Negative.   HENT: Negative.    Eyes: Negative.   Respiratory: Negative.    Cardiovascular: Negative.   Gastrointestinal: Negative.   Endocrine: Negative.   Genitourinary: Negative.   Musculoskeletal: Negative.   Skin: Negative.   Allergic/Immunologic: Negative.   Neurological: Negative.   Hematological: Negative.   Psychiatric/Behavioral: Negative.       Today's Vitals   04/23/23 1009  BP: 120/76   Pulse: 91  Temp: 98.7 F (37.1 C)  TempSrc: Oral  Weight: 134 lb 9.6 oz (61.1 kg)  Height: 5\' 5"  (1.651 m)  PainSc: 0-No pain   Body mass index is 22.4 kg/m.  Wt Readings from Last 3 Encounters:  04/23/23 134 lb 9.6 oz (61.1 kg)  12/11/22 135 lb 3.2 oz (61.3 kg)  08/09/22 139 lb (63 kg)     Objective:  Physical Exam Vitals reviewed.  Constitutional:      General: She is not in acute distress.    Appearance: Normal appearance. She is well-developed.     Comments: Thin appearance  HENT:     Head: Normocephalic and atraumatic.     Right Ear: Hearing, tympanic membrane, ear canal and external ear normal. There is no impacted cerumen.     Left Ear: Hearing, tympanic membrane, ear canal and external ear normal. There is no impacted cerumen.     Nose: Nose normal.     Mouth/Throat:     Mouth: Mucous membranes are moist.  Eyes:     General: Lids are normal.     Conjunctiva/sclera: Conjunctivae normal.     Pupils: Pupils are equal, round, and reactive to light.     Funduscopic exam:    Right eye: No papilledema.        Left eye: No papilledema.  Neck:     Thyroid: No thyroid mass.     Vascular: No carotid bruit.  Cardiovascular:     Rate and Rhythm: Normal rate and regular rhythm.     Pulses: Normal pulses.     Heart sounds: Normal heart sounds. No murmur heard. Pulmonary:     Effort: Pulmonary effort is normal. No respiratory distress.     Breath sounds: Normal breath sounds. No wheezing.  Abdominal:     General: Abdomen is flat. Bowel sounds are normal. There is no distension.     Palpations: Abdomen is soft.     Tenderness: There is no abdominal tenderness.  Musculoskeletal:        General: No swelling or tenderness. Normal range of motion.     Cervical back: Full passive range of motion without pain, normal range of motion and neck supple.     Right lower leg: No edema.     Left lower leg: No edema.  Skin:    General: Skin is warm and dry.     Capillary Refill:  Capillary refill takes less than 2 seconds.     Findings: No lesion (left posterior back with).     Comments: Large soft tissue lump to right upper back  Neurological:     General: No focal deficit present.     Mental Status: She is alert and oriented to person, place, and time.     Cranial Nerves: No cranial nerve deficit.     Sensory:  No sensory deficit.  Psychiatric:        Mood and Affect: Mood normal.        Behavior: Behavior normal.        Thought Content: Thought content normal.        Judgment: Judgment normal.         Assessment And Plan:     Encounter for annual health examination Assessment & Plan: Behavior modifications discussed and diet history reviewed.   Pt will continue to exercise regularly and modify diet with low GI, plant based foods and decrease intake of processed foods.  Recommend intake of daily multivitamin, Vitamin D, and calcium.  Recommend mammogram and colonoscopy for preventive screenings, as well as recommend immunizations that include influenza, TDAP, and Shingles   Orders: -     EKG 12-Lead -     POCT URINALYSIS DIP (CLINITEK) -     Microalbumin / creatinine urine ratio -     CMP14+EGFR  Mixed hyperlipidemia Assessment & Plan: Cholesterol levels have been stable. Continue statin .  Orders: -     Lipid panel  Hyperthyroidism Assessment & Plan: Continue f/u with Dr. Talmage Nap   Benign hypertension with CKD (chronic kidney disease) stage III (HCC) Assessment & Plan: Blood pressure is well controlled, continue current medications.    Influenza vaccination declined Assessment & Plan: Patient declined influenza vaccination at this time. Patient is aware that influenza vaccine prevents illness in 70% of healthy people, and reduces hospitalizations to 30-70% in elderly. This vaccine is recommended annually. Education has been provided regarding the importance of this vaccine but patient still declined. Advised may receive this vaccine at  local pharmacy or Health Dept.or vaccine clinic. Aware to provide a copy of the vaccination record if obtained from local pharmacy or Health Dept.  Pt is willing to accept risk associated with refusing vaccination.    Herpes zoster vaccination declined Assessment & Plan: Declines shingrix, educated on disease process and is aware if he changes his mind to notify office    COVID-19 vaccination declined Assessment & Plan: Declines covid 19 vaccine. Discussed risk of covid 79 and if she changes her mind about the vaccine to call the office. Education has been provided regarding the importance of this vaccine but patient still declined. Advised may receive this vaccine at local pharmacy or Health Dept.or vaccine clinic. Aware to provide a copy of the vaccination record if obtained from local pharmacy or Health Dept.  Encouraged to take multivitamin, vitamin d, vitamin c and zinc to increase immune system. Aware can call office if would like to have vaccine here at office. Verbalized acceptance and understanding.     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 HR 91   I am going to refer her to Cardiology  Orders: -     Ambulatory referral to Cardiology  Urinary tract infection with hematuria, site unspecified Assessment & Plan: Urinalysis is positive for nitrates, will send for urine culture. Nitrofuratoin sent to pharmacy to take.   Orders: -     Urine Culture -     Nitrofurantoin Monohyd Macro; Take 1 capsule (100 mg total) by mouth 2 (two) times daily for 5  days.  Dispense: 10 capsule; Refill: 0     Return for 1  year physical, uncontrolled bp 3-73months check, needs AWV. Patient was given opportunity to ask questions. Patient verbalized understanding of the plan and was able to repeat key elements of the plan. All questions were answered to their satisfaction.   Arnette Felts, FNP  I, Arnette Felts, FNP, have reviewed all documentation for this visit. The documentation on 04/23/23 for the exam, diagnosis, procedures, and orders are all accurate and complete.

## 2023-04-23 NOTE — Patient Instructions (Addendum)
Health Maintenance  Topic Date Due   Medicare Annual Wellness Visit  01/26/2023   COVID-19 Vaccine (4 - 2023-24 season) 05/09/2023*   Zoster (Shingles) Vaccine (1 of 2) 07/24/2023*   Pneumonia Vaccine (1 of 1 - PCV) 08/10/2023*   Flu Shot  10/08/2023*   DEXA scan (bone density measurement)  Completed   Hepatitis C Screening  Completed   HPV Vaccine  Aged Out   DTaP/Tdap/Td vaccine  Discontinued   Colon Cancer Screening  Discontinued  *Topic was postponed. The date shown is not the original due date.   You can call Dr. Leonides Sake office to schedule an appt she is a heart doctor [phone]

## 2023-04-24 DIAGNOSIS — M0589 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: Secondary | ICD-10-CM | POA: Diagnosis not present

## 2023-04-24 DIAGNOSIS — E0590 Thyrotoxicosis, unspecified without thyrotoxic crisis or storm: Secondary | ICD-10-CM | POA: Diagnosis not present

## 2023-04-24 DIAGNOSIS — D72819 Decreased white blood cell count, unspecified: Secondary | ICD-10-CM | POA: Diagnosis not present

## 2023-04-24 DIAGNOSIS — N1831 Chronic kidney disease, stage 3a: Secondary | ICD-10-CM | POA: Diagnosis not present

## 2023-04-24 DIAGNOSIS — M549 Dorsalgia, unspecified: Secondary | ICD-10-CM | POA: Diagnosis not present

## 2023-04-24 DIAGNOSIS — Z79899 Other long term (current) drug therapy: Secondary | ICD-10-CM | POA: Diagnosis not present

## 2023-04-24 LAB — CBC WITH DIFFERENTIAL/PLATELET
Basophils Absolute: 0 10*3/uL (ref 0.0–0.2)
Basos: 1 %
EOS (ABSOLUTE): 0.1 10*3/uL (ref 0.0–0.4)
Eos: 3 %
Hematocrit: 36.4 % (ref 34.0–46.6)
Hemoglobin: 11.4 g/dL (ref 11.1–15.9)
Immature Grans (Abs): 0 10*3/uL (ref 0.0–0.1)
Immature Granulocytes: 0 %
Lymphocytes Absolute: 1.3 10*3/uL (ref 0.7–3.1)
Lymphs: 51 %
MCH: 27.1 pg (ref 26.6–33.0)
MCHC: 31.3 g/dL — ABNORMAL LOW (ref 31.5–35.7)
MCV: 87 fL (ref 79–97)
Monocytes Absolute: 0.4 10*3/uL (ref 0.1–0.9)
Monocytes: 14 %
Neutrophils Absolute: 0.8 10*3/uL — ABNORMAL LOW (ref 1.4–7.0)
Neutrophils: 31 %
Platelets: 341 10*3/uL (ref 150–450)
RBC: 4.2 x10E6/uL (ref 3.77–5.28)
RDW: 13.2 % (ref 11.7–15.4)
WBC: 2.5 10*3/uL — CL (ref 3.4–10.8)

## 2023-04-24 LAB — CMP14+EGFR
ALT: 11 [IU]/L (ref 0–32)
AST: 21 [IU]/L (ref 0–40)
Albumin: 4.1 g/dL (ref 3.8–4.8)
Alkaline Phosphatase: 63 [IU]/L (ref 44–121)
BUN/Creatinine Ratio: 17 (ref 12–28)
BUN: 22 mg/dL (ref 8–27)
Bilirubin Total: 0.3 mg/dL (ref 0.0–1.2)
CO2: 23 mmol/L (ref 20–29)
Calcium: 10 mg/dL (ref 8.7–10.3)
Chloride: 105 mmol/L (ref 96–106)
Creatinine, Ser: 1.31 mg/dL — ABNORMAL HIGH (ref 0.57–1.00)
Globulin, Total: 2.6 g/dL (ref 1.5–4.5)
Glucose: 71 mg/dL (ref 70–99)
Potassium: 4.2 mmol/L (ref 3.5–5.2)
Sodium: 144 mmol/L (ref 134–144)
Total Protein: 6.7 g/dL (ref 6.0–8.5)
eGFR: 42 mL/min/{1.73_m2} — ABNORMAL LOW (ref >59)

## 2023-04-24 LAB — LIPID PANEL
Chol/HDL Ratio: 3.6 {ratio} (ref 0.0–4.4)
Cholesterol, Total: 138 mg/dL (ref 100–199)
HDL: 38 mg/dL — ABNORMAL LOW (ref >39)
LDL Chol Calc (NIH): 80 mg/dL (ref 0–99)
Triglycerides: 110 mg/dL (ref 0–149)
VLDL Cholesterol Cal: 20 mg/dL (ref 5–40)

## 2023-04-24 LAB — MICROALBUMIN / CREATININE URINE RATIO
Creatinine, Urine: 158.2 mg/dL
Microalb/Creat Ratio: 21 mg/g{creat} (ref 0–29)
Microalbumin, Urine: 33.3 ug/mL

## 2023-04-25 LAB — URINE CULTURE

## 2023-04-30 DIAGNOSIS — N390 Urinary tract infection, site not specified: Secondary | ICD-10-CM | POA: Insufficient documentation

## 2023-04-30 DIAGNOSIS — Z2821 Immunization not carried out because of patient refusal: Secondary | ICD-10-CM | POA: Insufficient documentation

## 2023-04-30 DIAGNOSIS — Z0000 Encounter for general adult medical examination without abnormal findings: Secondary | ICD-10-CM | POA: Insufficient documentation

## 2023-04-30 NOTE — Assessment & Plan Note (Signed)
Behavior modifications discussed and diet history reviewed.   Pt will continue to exercise regularly and modify diet with low GI, plant based foods and decrease intake of processed foods.  Recommend intake of daily multivitamin, Vitamin D, and calcium.  Recommend mammogram and colonoscopy for preventive screenings, as well as recommend immunizations that include influenza, TDAP, and Shingles  

## 2023-04-30 NOTE — Assessment & Plan Note (Signed)
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 HR 91   I am going to refer her to Cardiology

## 2023-04-30 NOTE — Assessment & Plan Note (Signed)
Urinalysis is positive for nitrates, will send for urine culture. Nitrofuratoin sent to pharmacy to take.

## 2023-04-30 NOTE — Assessment & Plan Note (Signed)
Continue f/u with Dr. Talmage Nap

## 2023-04-30 NOTE — Assessment & Plan Note (Signed)
Blood pressure is well controlled, continue current medications.

## 2023-04-30 NOTE — Assessment & Plan Note (Signed)
Declines shingrix, educated on disease process and is aware if he changes his mind to notify office  

## 2023-04-30 NOTE — Assessment & Plan Note (Signed)

## 2023-04-30 NOTE — Assessment & Plan Note (Signed)
Continue f/u with Dr. Deanne Coffer

## 2023-04-30 NOTE — Assessment & Plan Note (Signed)
Cholesterol levels have been stable. Continue statin .

## 2023-04-30 NOTE — Assessment & Plan Note (Signed)

## 2023-05-09 ENCOUNTER — Ambulatory Visit: Payer: Medicare PPO

## 2023-06-26 ENCOUNTER — Ambulatory Visit: Payer: Medicare PPO | Admitting: Nurse Practitioner

## 2023-06-29 DIAGNOSIS — E0520 Thyrotoxicosis with toxic multinodular goiter without thyrotoxic crisis or storm: Secondary | ICD-10-CM | POA: Diagnosis not present

## 2023-07-25 DIAGNOSIS — D72819 Decreased white blood cell count, unspecified: Secondary | ICD-10-CM | POA: Diagnosis not present

## 2023-07-25 DIAGNOSIS — N1831 Chronic kidney disease, stage 3a: Secondary | ICD-10-CM | POA: Diagnosis not present

## 2023-07-25 DIAGNOSIS — M0589 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: Secondary | ICD-10-CM | POA: Diagnosis not present

## 2023-07-25 DIAGNOSIS — E0590 Thyrotoxicosis, unspecified without thyrotoxic crisis or storm: Secondary | ICD-10-CM | POA: Diagnosis not present

## 2023-07-25 DIAGNOSIS — M549 Dorsalgia, unspecified: Secondary | ICD-10-CM | POA: Diagnosis not present

## 2023-07-25 DIAGNOSIS — Z79899 Other long term (current) drug therapy: Secondary | ICD-10-CM | POA: Diagnosis not present

## 2023-08-06 ENCOUNTER — Other Ambulatory Visit: Payer: Self-pay | Admitting: Nurse Practitioner

## 2023-08-06 DIAGNOSIS — I129 Hypertensive chronic kidney disease with stage 1 through stage 4 chronic kidney disease, or unspecified chronic kidney disease: Secondary | ICD-10-CM

## 2023-08-14 ENCOUNTER — Ambulatory Visit: Payer: Medicare PPO | Admitting: Nurse Practitioner

## 2023-08-14 ENCOUNTER — Encounter: Payer: Self-pay | Admitting: Nurse Practitioner

## 2023-08-14 VITALS — BP 114/60 | HR 108 | Temp 99.4°F | Ht 65.0 in | Wt 134.0 lb

## 2023-08-14 DIAGNOSIS — I129 Hypertensive chronic kidney disease with stage 1 through stage 4 chronic kidney disease, or unspecified chronic kidney disease: Secondary | ICD-10-CM | POA: Diagnosis not present

## 2023-08-14 DIAGNOSIS — E782 Mixed hyperlipidemia: Secondary | ICD-10-CM

## 2023-08-14 DIAGNOSIS — E0590 Thyrotoxicosis, unspecified without thyrotoxic crisis or storm: Secondary | ICD-10-CM

## 2023-08-14 DIAGNOSIS — M0589 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: Secondary | ICD-10-CM

## 2023-08-14 DIAGNOSIS — N183 Chronic kidney disease, stage 3 unspecified: Secondary | ICD-10-CM

## 2023-08-14 DIAGNOSIS — R509 Fever, unspecified: Secondary | ICD-10-CM | POA: Diagnosis not present

## 2023-08-14 LAB — POC SOFIA 2 FLU + SARS ANTIGEN FIA
Influenza A, POC: NEGATIVE
Influenza B, POC: NEGATIVE
SARS Coronavirus 2 Ag: NEGATIVE

## 2023-08-14 NOTE — Progress Notes (Signed)
 I,Jameka J Llittleton, CMA,acting as a neurosurgeon for [employer], FNP.,have documented all relevant documentation on the behalf of Gaines Ada, FNP,as directed by  Gaines Ada, FNP while in the presence of Gaines Ada, FNP.  Subjective:  Patient ID: Priscilla Thornton , female    DOB: 1945/09/17 , 78 y.o.   MRN: [id number]  Chief Complaint  Patient presents with   Hypertension    HPI  Patient presents today for bp,tsh, and chol check.  Patient reports compliance with medications. Patient denies having sob, chest pain or headaches at this time.  She has seen Dr Curt last month and is due to see Dr. Tommas.    Hypertension This is a chronic problem. The current episode started more than 1 year ago. The problem is unchanged. The problem is controlled. Pertinent negatives include no chest pain, headaches, palpitations or shortness of breath. There are no associated agents to hypertension. There are no known risk factors for coronary artery disease. Past treatments include angiotensin blockers. The current treatment provides moderate improvement. There are no compliance problems.  There is no history of angina, kidney disease or CVA. Identifiable causes of hypertension include a thyroid  problem. There is no history of chronic renal disease.     Past Medical History:  Diagnosis Date   Arthritis    Hyperlipidemia    Hypertension      Family History  Problem Relation Age of Onset   Diabetes Mother    Heart disease Father    Colon cancer Neg Hx    Esophageal cancer Neg Hx    Rectal cancer Neg Hx    Stomach cancer Neg Hx      Current Outpatient Medications:    acetaminophen  (TYLENOL ) 500 MG tablet, Take 500 mg by mouth every 6 (six) hours as needed., Disp: , Rfl:    aspirin  81 MG tablet, Take 81 mg by mouth daily., Disp: , Rfl:    atorvastatin  (LIPITOR) 20 MG tablet, Take 1 tablet (20 mg total) by mouth daily., Disp: 90 tablet, Rfl: 2   fish oil-omega-3 fatty acids 1000 MG capsule, Take  1 g by mouth daily., Disp: , Rfl:    folic acid  (FOLVITE ) 1 MG tablet, Take 1 mg by mouth daily., Disp: , Rfl:    hydrochlorothiazide  (HYDRODIURIL ) 12.5 MG tablet, Take 1 tablet (12.5 mg total) by mouth daily., Disp: 90 tablet, Rfl: 1   leflunomide  (ARAVA ) 10 MG tablet, Take 10 mg by mouth daily., Disp: , Rfl:    losartan  (COZAAR ) 100 MG tablet, Take 1 tablet (100 mg total) by mouth daily., Disp: 90 tablet, Rfl: 2   methimazole  (TAPAZOLE ) 5 MG tablet, Take 5 mg by mouth daily., Disp: , Rfl:    Multiple Vitamins-Calcium  (ONE-A-DAY WOMENS FORMULA PO), Take 1 tablet by mouth daily., Disp: , Rfl:    PEG-KCl-NaCl-NaSulf-Na Asc-C (PLENVU ) 140 g SOLR, Take 140 g by mouth as directed. Manufacturer's coupon Universal coupon code:BIN: [id number]; GROUP: [id number]; PCN: CNRX; ID: [id number]; PAY NO MORE $50, Disp: 1 each, Rfl: 0   potassium chloride  (KLOR-CON  M) 10 MEQ tablet, TAKE 1 TABLET(10 MEQ) BY MOUTH DAILY WITH FOOD, Disp: 90 tablet, Rfl: 1   No Known Allergies   Review of Systems  Constitutional: Negative.   Respiratory:  Negative for shortness of breath and wheezing.   Cardiovascular:  Negative for chest pain and palpitations.  Neurological:  Negative for headaches.  Psychiatric/Behavioral: Negative.       Today's Vitals   08/14/23 1544  BP: 114/60  Pulse: (!) 108  Temp: 99.4 F (37.4 C)  Weight: 134 lb (60.8 kg)  Height: 5' 5 (1.651 m)  PainSc: 0-No pain   Body mass index is 22.3 kg/m.  Wt Readings from Last 3 Encounters:  08/14/23 134 lb (60.8 kg)  04/23/23 134 lb 9.6 oz (61.1 kg)  12/11/22 135 lb 3.2 oz (61.3 kg)    Objective:  Physical Exam Vitals reviewed.  Constitutional:      General: She is not in acute distress.    Appearance: Normal appearance.  Cardiovascular:     Rate and Rhythm: Normal rate and regular rhythm.     Pulses: Normal pulses.     Heart sounds: Normal heart sounds. No murmur heard. Pulmonary:     Effort: Pulmonary effort is normal. No respiratory  distress.     Breath sounds: Normal breath sounds. No wheezing.  Musculoskeletal:     Cervical back: Normal range of motion and neck supple.  Skin:    General: Skin is warm and dry.     Capillary Refill: Capillary refill takes less than 2 seconds.  Neurological:     General: No focal deficit present.     Mental Status: She is alert and oriented to person, place, and time.     Cranial Nerves: No cranial nerve deficit.     Motor: No weakness.  Psychiatric:        Mood and Affect: Mood normal.        Behavior: Behavior normal.        Thought Content: Thought content normal.        Judgment: Judgment normal.         Assessment And Plan:  Benign hypertension with CKD (chronic kidney disease) stage III (HCC) Assessment & Plan: Blood pressure is well controlled, continue current medications.   Orders: -     BMP8+eGFR  Mixed hyperlipidemia Assessment & Plan: Cholesterol levels have been stable. Continue statin .   Hyperthyroidism Assessment & Plan: Continue f/u with Dr. Tommas   Low grade fever Assessment & Plan: Negative rapid strep and covid both are negative. Advised if has any new symptoms to let us  know.   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 Curt     Return for AWV in June, f/u in June and HM in October.  Patient was given opportunity to ask questions. Patient verbalized understanding of the plan and was able to repeat key elements of the plan. All questions were answered to their satisfaction.    LILLETTE Gaines Ada, FNP, have reviewed all documentation for this visit. The documentation on 08/14/23 for the exam, diagnosis, procedures, and orders are all accurate and complete.   IF YOU HAVE BEEN REFERRED TO A SPECIALIST, IT MAY TAKE 1-2 WEEKS TO SCHEDULE/PROCESS THE REFERRAL. IF YOU HAVE NOT HEARD FROM US /SPECIALIST IN TWO WEEKS, PLEASE  GIVE US  A CALL AT [phone].

## 2023-08-15 LAB — BMP8+EGFR
BUN/Creatinine Ratio: 18 (ref 12–28)
BUN: 22 mg/dL (ref 8–27)
CO2: 22 mmol/L (ref 20–29)
Calcium: 9.4 mg/dL (ref 8.7–10.3)
Chloride: 104 mmol/L (ref 96–106)
Creatinine, Ser: 1.21 mg/dL — ABNORMAL HIGH (ref 0.57–1.00)
Glucose: 90 mg/dL (ref 70–99)
Potassium: 4.1 mmol/L (ref 3.5–5.2)
Sodium: 142 mmol/L (ref 134–144)
eGFR: 46 mL/min/{1.73_m2} — ABNORMAL LOW (ref >59)

## 2023-08-26 NOTE — Assessment & Plan Note (Signed)
Continue f/u with Dr. Deanne Coffer

## 2023-08-26 NOTE — Assessment & Plan Note (Signed)
 Cholesterol levels have been stable.  Continue statin.

## 2023-08-26 NOTE — Assessment & Plan Note (Signed)
Continue f/u with Dr. Talmage Nap

## 2023-08-26 NOTE — Assessment & Plan Note (Signed)
 Blood pressure is well controlled, continue current medications.

## 2023-08-26 NOTE — Assessment & Plan Note (Signed)
 Negative rapid strep and covid both are negative. Advised if has any new symptoms to let us know.

## 2023-09-11 ENCOUNTER — Other Ambulatory Visit: Payer: Self-pay | Admitting: Nurse Practitioner

## 2023-09-11 DIAGNOSIS — N183 Chronic kidney disease, stage 3 unspecified: Secondary | ICD-10-CM

## 2023-10-01 ENCOUNTER — Other Ambulatory Visit: Payer: Self-pay | Admitting: Nurse Practitioner

## 2023-10-01 DIAGNOSIS — E782 Mixed hyperlipidemia: Secondary | ICD-10-CM

## 2023-10-01 DIAGNOSIS — I129 Hypertensive chronic kidney disease with stage 1 through stage 4 chronic kidney disease, or unspecified chronic kidney disease: Secondary | ICD-10-CM

## 2023-10-11 DIAGNOSIS — H40033 Anatomical narrow angle, bilateral: Secondary | ICD-10-CM | POA: Diagnosis not present

## 2023-10-11 DIAGNOSIS — H2513 Age-related nuclear cataract, bilateral: Secondary | ICD-10-CM | POA: Diagnosis not present

## 2023-11-02 DIAGNOSIS — Z79899 Other long term (current) drug therapy: Secondary | ICD-10-CM | POA: Diagnosis not present

## 2023-11-02 DIAGNOSIS — N1831 Chronic kidney disease, stage 3a: Secondary | ICD-10-CM | POA: Diagnosis not present

## 2023-11-02 DIAGNOSIS — M0589 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: Secondary | ICD-10-CM | POA: Diagnosis not present

## 2023-11-02 DIAGNOSIS — E0590 Thyrotoxicosis, unspecified without thyrotoxic crisis or storm: Secondary | ICD-10-CM | POA: Diagnosis not present

## 2023-11-02 DIAGNOSIS — M549 Dorsalgia, unspecified: Secondary | ICD-10-CM | POA: Diagnosis not present

## 2023-11-02 DIAGNOSIS — D72819 Decreased white blood cell count, unspecified: Secondary | ICD-10-CM | POA: Diagnosis not present

## 2023-11-21 DIAGNOSIS — H2511 Age-related nuclear cataract, right eye: Secondary | ICD-10-CM | POA: Diagnosis not present

## 2023-11-21 DIAGNOSIS — H40033 Anatomical narrow angle, bilateral: Secondary | ICD-10-CM | POA: Diagnosis not present

## 2023-12-14 DIAGNOSIS — H25811 Combined forms of age-related cataract, right eye: Secondary | ICD-10-CM | POA: Diagnosis not present

## 2023-12-25 DIAGNOSIS — E0520 Thyrotoxicosis with toxic multinodular goiter without thyrotoxic crisis or storm: Secondary | ICD-10-CM | POA: Diagnosis not present

## 2023-12-26 DIAGNOSIS — H2512 Age-related nuclear cataract, left eye: Secondary | ICD-10-CM | POA: Diagnosis not present

## 2023-12-28 DIAGNOSIS — H25812 Combined forms of age-related cataract, left eye: Secondary | ICD-10-CM | POA: Diagnosis not present

## 2023-12-31 ENCOUNTER — Emergency Department (HOSPITAL_COMMUNITY)

## 2023-12-31 ENCOUNTER — Inpatient Hospital Stay (HOSPITAL_COMMUNITY)
Admission: EM | Admit: 2023-12-31 | Discharge: 2024-01-14 | DRG: 064 | Disposition: A | Discharge status: Skilled Nursing Facility | Attending: Internal Medicine | Admitting: Internal Medicine

## 2023-12-31 DIAGNOSIS — E039 Hypothyroidism, unspecified: Secondary | ICD-10-CM | POA: Diagnosis present

## 2023-12-31 DIAGNOSIS — I63311 Cerebral infarction due to thrombosis of right middle cerebral artery: Principal | ICD-10-CM | POA: Diagnosis present

## 2023-12-31 DIAGNOSIS — R471 Dysarthria and anarthria: Secondary | ICD-10-CM | POA: Diagnosis present

## 2023-12-31 DIAGNOSIS — R1310 Dysphagia, unspecified: Secondary | ICD-10-CM | POA: Diagnosis present

## 2023-12-31 DIAGNOSIS — N3000 Acute cystitis without hematuria: Secondary | ICD-10-CM

## 2023-12-31 DIAGNOSIS — Z833 Family history of diabetes mellitus: Secondary | ICD-10-CM

## 2023-12-31 DIAGNOSIS — I6782 Cerebral ischemia: Secondary | ICD-10-CM | POA: Diagnosis not present

## 2023-12-31 DIAGNOSIS — E860 Dehydration: Secondary | ICD-10-CM | POA: Diagnosis present

## 2023-12-31 DIAGNOSIS — I639 Cerebral infarction, unspecified: Principal | ICD-10-CM | POA: Diagnosis present

## 2023-12-31 DIAGNOSIS — E0590 Thyrotoxicosis, unspecified without thyrotoxic crisis or storm: Secondary | ICD-10-CM | POA: Diagnosis present

## 2023-12-31 DIAGNOSIS — R4182 Altered mental status, unspecified: Secondary | ICD-10-CM | POA: Diagnosis not present

## 2023-12-31 DIAGNOSIS — I129 Hypertensive chronic kidney disease with stage 1 through stage 4 chronic kidney disease, or unspecified chronic kidney disease: Secondary | ICD-10-CM | POA: Diagnosis not present

## 2023-12-31 DIAGNOSIS — Z87891 Personal history of nicotine dependence: Secondary | ICD-10-CM

## 2023-12-31 DIAGNOSIS — N390 Urinary tract infection, site not specified: Secondary | ICD-10-CM | POA: Diagnosis present

## 2023-12-31 DIAGNOSIS — Z7901 Long term (current) use of anticoagulants: Secondary | ICD-10-CM | POA: Diagnosis not present

## 2023-12-31 DIAGNOSIS — Z515 Encounter for palliative care: Secondary | ICD-10-CM

## 2023-12-31 DIAGNOSIS — G8194 Hemiplegia, unspecified affecting left nondominant side: Secondary | ICD-10-CM | POA: Diagnosis not present

## 2023-12-31 DIAGNOSIS — I69391 Dysphagia following cerebral infarction: Secondary | ICD-10-CM | POA: Diagnosis not present

## 2023-12-31 DIAGNOSIS — H53462 Homonymous bilateral field defects, left side: Secondary | ICD-10-CM | POA: Diagnosis present

## 2023-12-31 DIAGNOSIS — M6282 Rhabdomyolysis: Secondary | ICD-10-CM | POA: Diagnosis present

## 2023-12-31 DIAGNOSIS — E782 Mixed hyperlipidemia: Secondary | ICD-10-CM | POA: Diagnosis not present

## 2023-12-31 DIAGNOSIS — E785 Hyperlipidemia, unspecified: Secondary | ICD-10-CM | POA: Diagnosis not present

## 2023-12-31 DIAGNOSIS — G460 Middle cerebral artery syndrome: Secondary | ICD-10-CM | POA: Diagnosis present

## 2023-12-31 DIAGNOSIS — Z7982 Long term (current) use of aspirin: Secondary | ICD-10-CM

## 2023-12-31 DIAGNOSIS — I480 Paroxysmal atrial fibrillation: Secondary | ICD-10-CM | POA: Diagnosis not present

## 2023-12-31 DIAGNOSIS — I63511 Cerebral infarction due to unspecified occlusion or stenosis of right middle cerebral artery: Secondary | ICD-10-CM | POA: Diagnosis not present

## 2023-12-31 DIAGNOSIS — R233 Spontaneous ecchymoses: Secondary | ICD-10-CM | POA: Diagnosis present

## 2023-12-31 DIAGNOSIS — E870 Hyperosmolality and hypernatremia: Secondary | ICD-10-CM | POA: Diagnosis not present

## 2023-12-31 DIAGNOSIS — R29810 Facial weakness: Secondary | ICD-10-CM | POA: Diagnosis not present

## 2023-12-31 DIAGNOSIS — R29719 NIHSS score 19: Secondary | ICD-10-CM | POA: Diagnosis not present

## 2023-12-31 DIAGNOSIS — R000 Tachycardia, unspecified: Secondary | ICD-10-CM | POA: Diagnosis not present

## 2023-12-31 DIAGNOSIS — R4701 Aphasia: Secondary | ICD-10-CM | POA: Diagnosis present

## 2023-12-31 DIAGNOSIS — Z8249 Family history of ischemic heart disease and other diseases of the circulatory system: Secondary | ICD-10-CM | POA: Diagnosis not present

## 2023-12-31 DIAGNOSIS — I4819 Other persistent atrial fibrillation: Secondary | ICD-10-CM | POA: Diagnosis not present

## 2023-12-31 DIAGNOSIS — I10 Essential (primary) hypertension: Secondary | ICD-10-CM

## 2023-12-31 DIAGNOSIS — Z431 Encounter for attention to gastrostomy: Secondary | ICD-10-CM | POA: Diagnosis not present

## 2023-12-31 DIAGNOSIS — I63411 Cerebral infarction due to embolism of right middle cerebral artery: Secondary | ICD-10-CM

## 2023-12-31 DIAGNOSIS — N183 Chronic kidney disease, stage 3 unspecified: Secondary | ICD-10-CM | POA: Diagnosis not present

## 2023-12-31 DIAGNOSIS — I63233 Cerebral infarction due to unspecified occlusion or stenosis of bilateral carotid arteries: Secondary | ICD-10-CM | POA: Diagnosis not present

## 2023-12-31 DIAGNOSIS — N1831 Chronic kidney disease, stage 3a: Secondary | ICD-10-CM | POA: Diagnosis present

## 2023-12-31 DIAGNOSIS — I619 Nontraumatic intracerebral hemorrhage, unspecified: Secondary | ICD-10-CM | POA: Diagnosis not present

## 2023-12-31 DIAGNOSIS — R1312 Dysphagia, oropharyngeal phase: Secondary | ICD-10-CM | POA: Diagnosis not present

## 2023-12-31 DIAGNOSIS — M0589 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: Secondary | ICD-10-CM | POA: Diagnosis not present

## 2023-12-31 DIAGNOSIS — Z743 Need for continuous supervision: Secondary | ICD-10-CM | POA: Diagnosis not present

## 2023-12-31 DIAGNOSIS — I6389 Other cerebral infarction: Secondary | ICD-10-CM | POA: Diagnosis not present

## 2023-12-31 DIAGNOSIS — Z7189 Other specified counseling: Secondary | ICD-10-CM | POA: Diagnosis not present

## 2023-12-31 DIAGNOSIS — M069 Rheumatoid arthritis, unspecified: Secondary | ICD-10-CM | POA: Diagnosis not present

## 2023-12-31 DIAGNOSIS — N179 Acute kidney failure, unspecified: Secondary | ICD-10-CM | POA: Diagnosis present

## 2023-12-31 DIAGNOSIS — B370 Candidal stomatitis: Secondary | ICD-10-CM | POA: Diagnosis not present

## 2023-12-31 DIAGNOSIS — I4891 Unspecified atrial fibrillation: Secondary | ICD-10-CM | POA: Diagnosis present

## 2023-12-31 DIAGNOSIS — I63419 Cerebral infarction due to embolism of unspecified middle cerebral artery: Secondary | ICD-10-CM | POA: Diagnosis not present

## 2023-12-31 DIAGNOSIS — D631 Anemia in chronic kidney disease: Secondary | ICD-10-CM | POA: Diagnosis present

## 2023-12-31 DIAGNOSIS — N281 Cyst of kidney, acquired: Secondary | ICD-10-CM | POA: Diagnosis not present

## 2023-12-31 DIAGNOSIS — Z79899 Other long term (current) drug therapy: Secondary | ICD-10-CM

## 2023-12-31 DIAGNOSIS — R531 Weakness: Secondary | ICD-10-CM | POA: Diagnosis not present

## 2023-12-31 DIAGNOSIS — Z01818 Encounter for other preprocedural examination: Secondary | ICD-10-CM | POA: Diagnosis not present

## 2023-12-31 DIAGNOSIS — E042 Nontoxic multinodular goiter: Secondary | ICD-10-CM | POA: Diagnosis not present

## 2023-12-31 LAB — URINALYSIS, COMPLETE (UACMP) WITH MICROSCOPIC
Bilirubin Urine: NEGATIVE
Glucose, UA: NEGATIVE mg/dL
Hgb urine dipstick: NEGATIVE
Ketones, ur: NEGATIVE mg/dL
Nitrite: NEGATIVE
Protein, ur: 30 mg/dL — AB
Specific Gravity, Urine: 1.02 (ref 1.005–1.030)
pH: 5 (ref 5.0–8.0)

## 2023-12-31 LAB — DIFFERENTIAL
Abs Immature Granulocytes: 0.01 10*3/uL (ref 0.00–0.07)
Basophils Absolute: 0 10*3/uL (ref 0.0–0.1)
Basophils Relative: 1 %
Eosinophils Absolute: 0 10*3/uL (ref 0.0–0.5)
Eosinophils Relative: 0 %
Immature Granulocytes: 0 %
Lymphocytes Relative: 17 %
Lymphs Abs: 0.8 10*3/uL (ref 0.7–4.0)
Monocytes Absolute: 0.5 10*3/uL (ref 0.1–1.0)
Monocytes Relative: 10 %
Neutro Abs: 3.5 10*3/uL (ref 1.7–7.7)
Neutrophils Relative %: 72 %

## 2023-12-31 LAB — CBC
HCT: 38 % (ref 36.0–46.0)
Hemoglobin: 11.6 g/dL — ABNORMAL LOW (ref 12.0–15.0)
MCH: 26.9 pg (ref 26.0–34.0)
MCHC: 30.5 g/dL (ref 30.0–36.0)
MCV: 88.2 fL (ref 80.0–100.0)
Platelets: 312 10*3/uL (ref 150–400)
RBC: 4.31 MIL/uL (ref 3.87–5.11)
RDW: 13.4 % (ref 11.5–15.5)
WBC: 4.9 10*3/uL (ref 4.0–10.5)
nRBC: 0 % (ref 0.0–0.2)

## 2023-12-31 LAB — COMPREHENSIVE METABOLIC PANEL WITH GFR
ALT: 16 U/L (ref 0–44)
AST: 41 U/L (ref 15–41)
Albumin: 3.4 g/dL — ABNORMAL LOW (ref 3.5–5.0)
Alkaline Phosphatase: 45 U/L (ref 38–126)
Anion gap: 12 (ref 5–15)
BUN: 32 mg/dL — ABNORMAL HIGH (ref 8–23)
CO2: 24 mmol/L (ref 22–32)
Calcium: 9.1 mg/dL (ref 8.9–10.3)
Chloride: 109 mmol/L (ref 98–111)
Creatinine, Ser: 1.3 mg/dL — ABNORMAL HIGH (ref 0.44–1.00)
GFR, Estimated: 42 mL/min — ABNORMAL LOW (ref >60)
Glucose, Bld: 128 mg/dL — ABNORMAL HIGH (ref 70–99)
Potassium: 4.2 mmol/L (ref 3.5–5.1)
Sodium: 145 mmol/L (ref 135–145)
Total Bilirubin: 0.9 mg/dL (ref 0.0–1.2)
Total Protein: 6.9 g/dL (ref 6.5–8.1)

## 2023-12-31 LAB — RAPID URINE DRUG SCREEN, HOSP PERFORMED
Amphetamines: NOT DETECTED
Barbiturates: NOT DETECTED
Benzodiazepines: NOT DETECTED
Cocaine: NOT DETECTED
Opiates: NOT DETECTED
Tetrahydrocannabinol: NOT DETECTED

## 2023-12-31 LAB — I-STAT CHEM 8, ED
BUN: 33 mg/dL — ABNORMAL HIGH (ref 8–23)
Calcium, Ion: 0.97 mmol/L — ABNORMAL LOW (ref 1.15–1.40)
Chloride: 109 mmol/L (ref 98–111)
Creatinine, Ser: 1.2 mg/dL — ABNORMAL HIGH (ref 0.44–1.00)
Glucose, Bld: 125 mg/dL — ABNORMAL HIGH (ref 70–99)
HCT: 37 % (ref 36.0–46.0)
Hemoglobin: 12.6 g/dL (ref 12.0–15.0)
Potassium: 4 mmol/L (ref 3.5–5.1)
Sodium: 146 mmol/L — ABNORMAL HIGH (ref 135–145)
TCO2: 23 mmol/L (ref 22–32)

## 2023-12-31 LAB — CREATININE, URINE, RANDOM: Creatinine, Urine: 150 mg/dL

## 2023-12-31 LAB — PROTIME-INR
INR: 1.2 (ref 0.8–1.2)
Prothrombin Time: 15.1 s (ref 11.4–15.2)

## 2023-12-31 LAB — CK: Total CK: 755 U/L — ABNORMAL HIGH (ref 38–234)

## 2023-12-31 LAB — OSMOLALITY, URINE: Osmolality, Ur: 827 mosm/kg (ref 300–900)

## 2023-12-31 LAB — ETHANOL: Alcohol, Ethyl (B): <15 mg/dL (ref <15)

## 2023-12-31 LAB — APTT: aPTT: 25 s (ref 24–36)

## 2023-12-31 MED ORDER — SODIUM CHLORIDE 0.9 % IV SOLN
1.0000 g | INTRAVENOUS | Status: AC
  Administered 2023-12-31 – 2024-01-04 (×5): 1 g via INTRAVENOUS
  Filled 2023-12-31 (×5): qty 10

## 2023-12-31 MED ORDER — SODIUM CHLORIDE 0.9 % IV BOLUS
1000.0000 mL | Freq: Once | INTRAVENOUS | Status: AC
  Administered 2023-12-31: 1000 mL via INTRAVENOUS

## 2023-12-31 MED ORDER — ASPIRIN 300 MG RE SUPP
300.0000 mg | Freq: Once | RECTAL | Status: AC
  Administered 2023-12-31: 300 mg via RECTAL
  Filled 2023-12-31: qty 1

## 2023-12-31 MED ORDER — IOHEXOL 350 MG/ML SOLN
75.0000 mL | Freq: Once | INTRAVENOUS | Status: AC | PRN
  Administered 2023-12-31: 75 mL via INTRAVENOUS

## 2023-12-31 NOTE — ED Notes (Signed)
 CCMD called, pt on monitor

## 2023-12-31 NOTE — Consult Note (Signed)
 [facility] CONSULT NOTE   Date of service: December 31, 2023 Patient Name: Priscilla Thornton MRN:  [id number] DOB:  07-10-1946 Chief Complaint: Left-sided weakness, right gaze Requesting Provider: Patt Alm Macho, MD  History of Present Illness  Priscilla Thornton is a 78 y.o. female with hx of hypertension, hyperlipidemia, who still works in a full-time job, was last known well sometime on Saturday by family, brought in after being found down on a welfare check. Patient did not report to her work this morning which prompted the cascade that led to a welfare check and she was found in her bed in her urine which is very unlike her.  She is usually the first 1 to get at work and is completely independent. She has no history of abnormal heart rhythms or prior stroke. She was noted to be flaccid on the left side, rightward gaze deviation, left facial droop and left-sided hemianopsia.  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 Neurology was consulted  LKW: Sometime the evening of Saturday, 12/28/2021 Modified rankin score: 0-Completely asymptomatic and back to baseline post- stroke IV Thrombolysis: No-outside the window EVT: No-outside the window, completed stroke  NIHSS components Score: Comment  1a Level of Conscious 0[x]  1[]  2[]  3[]      1b LOC Questions 0[]  1[]  2[x]       1c LOC Commands 0[x]  1[]  2[]       2 Best Gaze 0[]  1[]  2[x]       3 Visual 0[]  1[]  2[x]  3[]      4 Facial Palsy 0[]  1[]  2[x]  3[]      5a Motor Arm - left 0[]  1[]  2[]  3[x]  4[]  UN[]    5b Motor Arm - Right 0[x]  1[]  2[]  3[]  4[]  UN[]    6a Motor Leg - Left 0[]  1[]  2[x]  3[]  4[]  UN[]    6b Motor Leg - Right 0[x]  1[]  2[]  3[]  4[]  UN[]    7 Limb Ataxia 0[x]  1[]  2[]  UN[]      8 Sensory 0[]  1[x]  2[]  UN[]      9 Best Language 0[]  1[x]  2[]  3[]      10 Dysarthria 0[]  1[]  2[x]  UN[]      11 Extinct. and Inattention 0[]  1[]  2[x]       TOTAL: 19       ROS  Performed and pertinent positives documented in the HPI.  Past History   Past Medical History:  Diagnosis Date   Arthritis    Hyperlipidemia    Hypertension     No past surgical history on file.  Family History: Family History  Problem Relation Age of Onset   Diabetes Mother    Heart disease Father    Colon cancer Neg Hx    Esophageal cancer Neg Hx    Rectal cancer Neg Hx    Stomach cancer Neg Hx     Social History  reports that she quit smoking about 9 years ago. Her smoking use included cigarettes. She has never used smokeless tobacco. She reports that she does not drink alcohol and does not use drugs.  No Known Allergies  Medications  No current facility-administered medications for this encounter.  Current Outpatient Medications:    acetaminophen (TYLENOL) 500 MG tablet, Take 500 mg by mouth every 6 (six) hours as needed., Disp: , Rfl:    aspirin 81 MG tablet, Take 81 mg by mouth daily., Disp: , Rfl:    atorvastatin  (LIPITOR) 20 MG tablet, TAKE 1  TABLET(20 MG) BY MOUTH DAILY, Disp: 90 tablet, Rfl: 2   fish oil-omega-3 fatty acids 1000 MG capsule, Take 1 g by mouth daily., Disp: , Rfl:    folic acid (FOLVITE) 1 MG tablet, Take 1 mg by mouth daily., Disp: , Rfl:    hydrochlorothiazide  (HYDRODIURIL ) 12.5 MG tablet, TAKE 1 TABLET(12.5 MG) BY MOUTH DAILY, Disp: 90 tablet, Rfl: 1   leflunomide (ARAVA) 10 MG tablet, Take 10 mg by mouth daily., Disp: , Rfl:    losartan  (COZAAR ) 100 MG tablet, TAKE 1 TABLET(100 MG) BY MOUTH DAILY, Disp: 90 tablet, Rfl: 2   methimazole (TAPAZOLE) 5 MG tablet, Take 5 mg by mouth daily., Disp: , Rfl:    Multiple Vitamins-Calcium  (ONE-A-DAY WOMENS FORMULA PO), Take 1 tablet by mouth daily., Disp: , Rfl:    PEG-KCl-NaCl-NaSulf-Na Asc-C (PLENVU ) 140 g SOLR, Take 140 g by mouth as directed. Manufacturer's coupon Universal coupon code:BIN: [id number]; GROUP: [id number]; PCN: [id number]; ID: [id number]; PAY NO MORE $50, Disp: 1 each, Rfl: 0    potassium chloride  (KLOR-CON  M) 10 MEQ tablet, TAKE 1 TABLET(10 MEQ) BY MOUTH DAILY WITH FOOD, Disp: 90 tablet, Rfl: 1  Vitals   Vitals:   2024/01/15 1504 01-15-2024 1800 01/15/24 1817  BP: (!) 142/100 (!) 140/88 132/89  Pulse: (!) 101 100 92  Resp: (!) 22 (!) 27 19  Temp: 99 F (37.2 C) 99 F (37.2 C)   TempSrc: Oral Oral   SpO2: 97% 97% 97%    There is no height or weight on file to calculate BMI.   Physical Exam  General: Well-developed well-nourished in no acute distress HEENT: Normocephalic atraumatic Chest: Clear Cardiovascular: Irregular rhythm-palpable and on monitor. Neurological exam She is awake alert.  There is mild aphasia-expressive, she is able to follow commands and severe dysarthria. Cranial nerves: Pupils equal round reactive to light, forced right gaze deviation, left homonymous hemianopsia, left lower facial weakness, tongue and palate midline. Motor examination with nearly flaccid left upper extremity with minimal movement to noxious stimulation.  Left lower extremity has some withdrawal to noxious simulation.  Right side is full strength. Sensory examination reveals diminished response to noxious stimulation on the left in comparison to the right with extinction on double simultaneous stimulation. Coordination examination with no dysmetria on the right, unable to check on the left.  Labs/Imaging/Neurodiagnostic studies   CBC:  Recent Labs  Lab 15-Jan-2024 1506 01-15-2024 1521  WBC 4.9  --   NEUTROABS 3.5  --   HGB 11.6* 12.6  HCT 38.0 37.0  MCV 88.2  --   PLT 312  --    Basic Metabolic Panel:  Lab Results  Component Value Date   NA 146 (H) Jan 15, 2024   K 4.0 January 15, 2024   CO2 24 January 15, 2024   GLUCOSE 125 (H) 01/15/2024   BUN 33 (H) 01-15-2024   CREATININE 1.20 (H) 01-15-24   CALCIUM  9.1 01-15-2024   GFRNONAA 42 (L) 01-15-2024   GFRAA 41 03/23/2021   Lipid Panel:  Lab Results  Component Value Date   LDLCALC 80 04/23/2023   HgbA1c:  Lab  Results  Component Value Date   HGBA1C 5.2 07/07/2021   Urine Drug Screen:     Component Value Date/Time   LABOPIA NONE DETECTED 15-Jan-2024 1703   COCAINSCRNUR NONE DETECTED 01-15-24 1703   LABBENZ NONE DETECTED 01/15/2024 1703   AMPHETMU NONE DETECTED 2024-01-15 1703   THCU NONE DETECTED 01-15-24 1703   LABBARB NONE DETECTED 01-15-2024 1703    Alcohol Level  Component Value Date/Time   [facility] <15 12/31/2023 1506   INR  Lab Results  Component Value Date   INR 1.2 12/31/2023   APTT  Lab Results  Component Value Date   APTT 25 12/31/2023   CT Head without contrast(Personally reviewed): Established acute right MCA infarct  CT angio Head and Neck with contrast(Personally reviewed): Severe near occlusive stenosis of the right MCA mid and distal M1 and adjacent proximal M2 branch.  Eccentric filling defect at the site suggesting presence of thrombus.  Nonstenotic atherosclerotic plaque within the intracranial internal carotid arteries  ASSESSMENT   Priscilla Thornton is a 78 y.o. female with risk factors and past medical history as documented in the HPI brought in for evaluation of left-sided paralysis, right gaze deviation, left facial droop and left homonymous hemianopsia-clinical exam consistent with a right MCA syndrome.  CT head reveals a large right MCA territory infarct.  CT angiography reveals severe near occlusive stenosis of the right MCA M1 distal segment and adjacent proximal M2 branch. Heart monitor shows irregular rhythm-suspect the stroke is likely cardioembolic  Impression: Acute ischemic stroke-etiology under investigation with strong suspicion for this being a cardioembolic event  RECOMMENDATIONS  Admit to hospitalist Frequent neurochecks Telemetry Aspirin 81 p.o. Plavix 75 2D echo A1c Lipid panel MRI brain without contrast PT OT Speech therapy Permissive hypertension-allow for blood pressures to be high-treat only if systolic is greater than 220 on  a as needed basis for the next 1 or 2 days and then start normalizing blood pressures. I had a detailed conversation with the family explaining that she will have a large stroke with likely disabling symptoms.  She is at baseline very independent and may not be able to return to independent living anytime soon. Stroke team to follow Plan discussed with Dr. Patt ______________________________________________________________________    Signed, Eligio Lav, MD [facility]

## 2023-12-31 NOTE — ED Provider Notes (Signed)
 Priscilla Thornton Provider Note   CSN: [id number] Arrival date & time: 12/31/23  1453     Patient presents with: No chief complaint on file.   Priscilla Thornton is a 78 y.o. female history of hypertension, hypothyroidism here presenting with altered mental status.  Patient's last normal was Saturday when family called her and she appears to be normal.  She missed church on Sunday and did not answer her phone.  Today she did not show up at work so family was worried and called the police for wellness check.  Police went to the house and she was on her bed and was covered in urine.  Patient appears to also have left-sided weakness.  Last normal was about 48 hours prior to arrival   The history is provided by the patient.       Prior to Admission medications   Medication Sig Start Date End Date Taking? Authorizing Provider  acetaminophen (TYLENOL) 500 MG tablet Take 500 mg by mouth every 6 (six) hours as needed.    [provider]  aspirin 81 MG tablet Take 81 mg by mouth daily.    [provider]  atorvastatin  (LIPITOR) 20 MG tablet TAKE 1 TABLET(20 MG) BY MOUTH DAILY 10/01/23   Priscilla Speaks, FNP  fish oil-omega-3 fatty acids 1000 MG capsule Take 1 g by mouth daily.    [provider]  folic acid (FOLVITE) 1 MG tablet Take 1 mg by mouth daily.    [provider]  hydrochlorothiazide  (HYDRODIURIL ) 12.5 MG tablet TAKE 1 TABLET(12.5 MG) BY MOUTH DAILY 09/12/23   Priscilla Speaks, FNP  leflunomide (ARAVA) 10 MG tablet Take 10 mg by mouth daily.    [provider]  losartan  (COZAAR ) 100 MG tablet TAKE 1 TABLET(100 MG) BY MOUTH DAILY 10/01/23   Priscilla Speaks, FNP  methimazole (TAPAZOLE) 5 MG tablet Take 5 mg by mouth daily.    [provider]  Multiple Vitamins-Calcium  (ONE-A-DAY WOMENS FORMULA PO) Take 1 tablet by mouth daily.    [provider]  PEG-KCl-NaCl-NaSulf-Na Asc-C (PLENVU ) 140 g SOLR Take  140 g by mouth as directed. Manufacturer's coupon Universal coupon code:BIN: [id number]; GROUP: [id number]; PCN: [id number]; ID: [id number]; PAY NO MORE $50 09/02/21   Priscilla Victory LITTIE DOUGLAS, MD  potassium chloride  (KLOR-CON  M) 10 MEQ tablet TAKE 1 TABLET(10 MEQ) BY MOUTH DAILY WITH FOOD 08/07/23   Priscilla Speaks, FNP    Allergies: Patient has no known allergies.    Review of Systems  Psychiatric/Behavioral:  Positive for confusion.   All other systems reviewed and are negative.   Updated Vital Signs BP (!) 142/100 (BP Location: Right Arm)   Pulse (!) 101   Temp 99 F (37.2 C) (Oral)   Resp (!) 22   SpO2 97%   Physical Exam Vitals and nursing note reviewed.  Constitutional:      Comments: Confused and aphasic  HENT:     Head: Normocephalic.     Nose: Nose normal.     Mouth/Throat:     Mouth: Mucous membranes are dry.   Eyes:     Extraocular Movements: Extraocular movements intact.     Pupils: Pupils are equal, round, and reactive to light.     Comments: Eye deviation to the right   Cardiovascular:     Rate and Rhythm: Normal rate and regular rhythm.     Pulses: Normal pulses.     Heart sounds: Normal heart sounds.  Pulmonary:  Effort: Pulmonary effort is normal.     Breath sounds: Normal breath sounds.  Abdominal:     General: Abdomen is flat.     Palpations: Abdomen is soft.   Musculoskeletal:        General: Normal range of motion.     Cervical back: Normal range of motion.   Skin:    General: Skin is warm.   Neurological:     Comments: Flaccid paralysis to the left arm and leg.  Patient has right facial droop as well.  Eye deviation to the right  Psychiatric:        Mood and Affect: Mood normal.        Behavior: Behavior normal.     (all labs ordered are listed, but only abnormal results are displayed) Labs Reviewed  CBC - Abnormal; Notable for the following components:      Result Value   Hemoglobin 11.6 (*)    All other components within normal limits  I-STAT  CHEM 8, ED - Abnormal; Notable for the following components:   Sodium 146 (*)    BUN 33 (*)    Creatinine, Ser 1.20 (*)    Glucose, Bld 125 (*)    Calcium , Ion 0.97 (*)    All other components within normal limits  PROTIME-INR  APTT  DIFFERENTIAL  ETHANOL  COMPREHENSIVE METABOLIC PANEL WITH GFR  RAPID URINE DRUG SCREEN, HOSP PERFORMED  CK    EKG: EKG Interpretation Date/Time:  Monday December 31 2023 15:06:36 EDT Ventricular Rate:  109 PR Interval:    QRS Duration:  121 QT Interval:  361 QTC Calculation: 487 R Axis:   -42  Text Interpretation: Atrial fibrillation RBBB and LAFB Nonspecific T abnormalities, lateral leads No previous ECGs available Confirmed by Patt Alm DEL ([id number]) on 12/31/2023 3:27:16 PM  Radiology: [facility] Chest Port 1 View Result Date: 12/31/2023 CLINICAL DATA:  Altered mental status EXAM: PORTABLE CHEST 1 VIEW COMPARISON:  Chest x-ray 12/26/2011 FINDINGS: The heart size and mediastinal contours are within normal limits. Both lungs are clear. The visualized skeletal structures are unremarkable. IMPRESSION: No active disease. Electronically Signed   By: Greig Pique M.D.   On: 12/31/2023 15:48     Procedures   CRITICAL CARE Performed by: Alm DEL Patt   Total critical care time: 39 minutes  Critical care time was exclusive of separately billable procedures and treating other patients.  Critical care was necessary to treat or prevent imminent or life-threatening deterioration.  Critical care was time spent personally by me on the following activities: development of treatment plan with patient and/or surrogate as well as nursing, discussions with consultants, evaluation of patient's response to treatment, examination of patient, obtaining history from patient or surrogate, ordering and performing treatments and interventions, ordering and review of laboratory studies, ordering and review of radiographic studies, pulse oximetry and re-evaluation of patient's  condition.   Medications Ordered in the ED  sodium chloride  0.9 % bolus 1,000 mL (1,000 mLs Intravenous New Bag/Given 12/31/23 1520)                                    Medical Decision Making Priscilla Thornton is a 78 y.o. female here presenting with left-sided weakness and right eye deviation.  Concern for bleed versus ischemic stroke versus UTI versus pneumonia. patient last normal was about 48 hours prior to arrival.  Plan to get CTA head and neck  and labs and CK level.  Will hydrate patient and patient likely need admission  7:05 PM Reviewed patient's labs and sodium is 146.  CK level was 755.  CTA showed large right MCA infarct with petechial hemorrhage.  Discussed with Dr. Deedra from neurology.  He will see patient and recommended medicine admission for stroke workup  Problems Addressed: Cerebrovascular accident (CVA), unspecified mechanism (HCC): acute illness or injury  Amount and/or Complexity of Data Reviewed Labs: ordered. Decision-making details documented in ED Course. Radiology: ordered and independent interpretation performed. Decision-making details documented in ED Course.  Risk Prescription drug management. Decision regarding hospitalization.     Final diagnoses:  None    ED Discharge Orders     None          Patt Alm Macho, MD 12/31/23 [phone]

## 2023-12-31 NOTE — ED Notes (Signed)
 Pt back to room from CT

## 2023-12-31 NOTE — Assessment & Plan Note (Signed)
 Check TSH

## 2023-12-31 NOTE — Subjective & Objective (Signed)
 Patient has not been seen since Saturday by family eventually well check was called patient was found Aphasic with left hemiparesis and right-sided deviated gait soaked in urine CT scan showing completed CVA large right MCA territory infarct with an occluded right MCA M1 segment.  No prior history of stroke Neurology have seen patient in consult

## 2023-12-31 NOTE — H&P (Signed)
 [name]:[id number] DOB: 04-12-1946 DOA: 12/31/2023     PCP: Priscilla Speaks, FNP     Patient arrived to ER on 12/31/23 at 1453 Referred by Attending Patt Alm Macho, MD   Patient coming from:    home Lives alone  Chief Complaint: found with dense hemiparesis HPI: Priscilla Thornton is a 78 y.o. female with medical history significant of hyperthyroidism, RA, HTN, A.fib not on [facility]  Presented with   found down Patient has not been seen since Saturday by family eventually well check was called patient was found Aphasic with left hemiparesis and right-sided deviated gait soaked in urine CT scan showing completed CVA large right MCA territory infarct with an occluded right MCA M1 segment.  No prior history of stroke Neurology have seen patient in consult   Works as a Scientist, physiological  She had a Cataract surgery on Friday A friend of her spoke to her Saturday evening and they were planing to see each other at church  Denies significant ETOH intake   Does not smoke       Regarding pertinent Chronic problems:  Hyperlipidemia - on statins Lipitor (atorvastatin )  Lipid Panel     Component Value Date/Time   CHOL 138 04/23/2023 1104   TRIG 110 04/23/2023 1104   HDL 38 (L) 04/23/2023 1104   CHOLHDL 3.6 04/23/2023 1104   LDLCALC 80 04/23/2023 1104   LABVLDL 20 04/23/2023 1104    HTN on Losartan , HCTZ  Hyperthyroidism:   Lab Results  Component Value Date   TSH 0.218 (L) 04/12/2022   T4TOTAL 7.7 04/12/2022   on methIMAzole        CKD stage IIIa baseline Cr 1.2 CrCl cannot be calculated (Unknown ideal weight.).  Lab Results  Component Value Date   CREATININE 1.20 (H) 12/31/2023   CREATININE 1.30 (H) 12/31/2023   CREATININE 1.21 (H) 08/14/2023   Lab Results  Component Value Date   NA 146 (H) 12/31/2023   CL 109 12/31/2023   K 4.0 12/31/2023   CO2 24 12/31/2023   BUN 33 (H) 12/31/2023   CREATININE 1.20 (H) 12/31/2023   GFRNONAA 42 (L) 12/31/2023    CALCIUM  9.1 12/31/2023   ALBUMIN 3.4 (L) 12/31/2023   GLUCOSE 125 (H) 12/31/2023     Hepatic Function Panel     Component Value Date/Time   PROT 6.9 12/31/2023 1506   PROT 6.7 04/23/2023 1104   ALBUMIN 3.4 (L) 12/31/2023 1506   ALBUMIN 4.1 04/23/2023 1104   AST 41 12/31/2023 1506   ALT 16 12/31/2023 1506   ALKPHOS 45 12/31/2023 1506   BILITOT 0.9 12/31/2023 1506   BILITOT 0.3 04/23/2023 1104   INR 1.2    Chronic anemia - baseline hg Hemoglobin & Hematocrit  Recent Labs    04/23/23 1104 12/31/23 1506 12/31/23 1521  HGB 11.4 11.6* 12.6   Iron/TIBC/Ferritin/ %Sat    Component Value Date/Time   IRON 63 04/12/2022 0947   TIBC 344 04/12/2022 0947   FERRITIN 506 (H) 04/12/2022 0947   IRONPCTSAT 18 04/12/2022 0947    While in ER:         Lab Orders         Ethanol         Protime-INR         APTT         CBC         Differential         Comprehensive metabolic panel  Urine rapid drug screen (hosp performed)         CK         I-stat chem 8, ED      CT HEAD Established acute right MCA infarct   CTA head and neck - Severe near occlusive stenosis of the right MCA mid and distal M1 and adjacent proximal M2 branch. Eccentric filling defect at the site suggesting presence of thrombus. Nonstenotic atherosclerotic plaque within the intracranial internal carotid arteries   CXR -  NON acute    Following Medications were ordered in ER: Medications  sodium chloride  0.9 % bolus 1,000 mL (0 mLs Intravenous Stopped 12/31/23 2249)  iohexol (OMNIPAQUE) 350 MG/ML injection 75 mL (75 mLs Intravenous Contrast Given 12/31/23 1728)  aspirin suppository 300 mg (300 mg Rectal Given 12/31/23 1952)    _______________________________________________________ ER Provider Called:    Neurology   Dr.Arora They Recommend admit to medicine    SEEN in ER     ED Triage Vitals [12/31/23 1504]  Encounter Vitals Group     BP (!) 142/100     Girls Systolic BP Percentile      Girls  Diastolic BP Percentile      Boys Systolic BP Percentile      Boys Diastolic BP Percentile      Pulse Rate (!) 101     Resp (!) 22     Temp 99 F (37.2 C)     Temp Source Oral     SpO2 97 %     Weight      Height      Head Circumference      Peak Flow      Pain Score      Pain Loc      Pain Education      Exclude from Growth Chart   UFJK(75)@     _________________________________________ Significant initial  Findings: Abnormal Labs Reviewed  CBC - Abnormal; Notable for the following components:      Result Value   Hemoglobin 11.6 (*)    All other components within normal limits  COMPREHENSIVE METABOLIC PANEL WITH GFR - Abnormal; Notable for the following components:   Glucose, Bld 128 (*)    BUN 32 (*)    Creatinine, Ser 1.30 (*)    Albumin 3.4 (*)    GFR, Estimated 42 (*)    All other components within normal limits  CK - Abnormal; Notable for the following components:   Total CK 755 (*)    All other components within normal limits  I-STAT CHEM 8, ED - Abnormal; Notable for the following components:   Sodium 146 (*)    BUN 33 (*)    Creatinine, Ser 1.20 (*)    Glucose, Bld 125 (*)    Calcium , Ion 0.97 (*)    All other components within normal limits      _________________________ Troponin  ordered Cardiac Panel (last 3 results) Recent Labs    12/31/23 1506  CKTOTAL 755*     ECG: Ordered Personally reviewed and interpreted by me showing: HR : 109 Rhythm:Atrial fibrillation RBBB and LAFB Nonspecific T abnormalities, lateral leads QTC 487    The recent clinical data is shown below. Vitals:   12/31/23 1504 12/31/23 1800 12/31/23 1817  BP: (!) 142/100 (!) 140/88 132/89  Pulse: (!) 101 100 92  Resp: (!) 22 (!) 27 19  Temp: 99 F (37.2 C) 99 F (37.2 C)   TempSrc: Oral Oral   SpO2: 97%  97% 97%    WBC     Component Value Date/Time   WBC 4.9 12/31/2023 1506   LYMPHSABS 0.8 12/31/2023 1506   LYMPHSABS 1.3 04/23/2023 1104   MONOABS 0.5 12/31/2023  1506   EOSABS 0.0 12/31/2023 1506   EOSABS 0.1 04/23/2023 1104   BASOSABS 0.0 12/31/2023 1506   BASOSABS 0.0 04/23/2023 1104     UA   evidence of UTI     Urine analysis:    Component Value Date/Time   COLORURINE AMBER (A) 12/31/2023 1703   APPEARANCEUR TURBID (A) 12/31/2023 1703   LABSPEC 1.020 12/31/2023 1703   PHURINE 5.0 12/31/2023 1703   GLUCOSEU NEGATIVE 12/31/2023 1703   HGBUR NEGATIVE 12/31/2023 1703   BILIRUBINUR NEGATIVE 12/31/2023 1703   BILIRUBINUR negative 04/23/2023 1256   BILIRUBINUR negative 04/12/2022 1301   KETONESUR NEGATIVE 12/31/2023 1703   PROTEINUR 30 (A) 12/31/2023 1703   UROBILINOGEN 0.2 04/23/2023 1256   NITRITE NEGATIVE 12/31/2023 1703   LEUKOCYTESUR LARGE (A) 12/31/2023 1703     ABX started rocephin   _________    __________________________________________________________ Recent Labs  Lab 12/31/23 1506 12/31/23 1521  NA 145 146*  K 4.2 4.0  CO2 24  --   GLUCOSE 128* 125*  BUN 32* 33*  CREATININE 1.30* 1.20*  CALCIUM  9.1  --     Cr   stable,    Lab Results  Component Value Date   CREATININE 1.20 (H) 12/31/2023   CREATININE 1.30 (H) 12/31/2023   CREATININE 1.21 (H) 08/14/2023    Recent Labs  Lab 12/31/23 1506  AST 41  ALT 16  ALKPHOS 45  BILITOT 0.9  PROT 6.9  ALBUMIN 3.4*   Lab Results  Component Value Date   CALCIUM  9.1 12/31/2023    Plt: Lab Results  Component Value Date   PLT 312 12/31/2023       Recent Labs  Lab 12/31/23 1506 12/31/23 1521  WBC 4.9  --   NEUTROABS 3.5  --   HGB 11.6* 12.6  HCT 38.0 37.0  MCV 88.2  --   PLT 312  --     HG/HCT stable,       Component Value Date/Time   HGB 12.6 12/31/2023 1521   HGB 11.4 04/23/2023 1104   HCT 37.0 12/31/2023 1521   HCT 36.4 04/23/2023 1104   MCV 88.2 12/31/2023 1506   MCV 87 04/23/2023 1104        _______________________________________________ Hospitalist was called for admission for  CVA  The following Work up has been ordered so  far:  Orders Placed This Encounter  Procedures   DG Chest Port 1 View   CT ANGIO HEAD NECK W WO CM   Ethanol   Protime-INR   APTT   CBC   Differential   Comprehensive metabolic panel   Urine rapid drug screen (hosp performed)   CK   Diet NPO time specified   Vital signs   ED Cardiac monitoring   NIH Stroke Scale   Swallow screen   Initiate Carrier Fluid Protocol   If O2 sat <94% Administer O2 @ 2 Liters/Minute If O2 Sat < 94%, administer O2 at 2 liters/minute via nasal cannula.   In and Out Cath   Consult to hospitalist   ED Pulse oximetry, continuous   SLP eval and treat Reason for evaluation: .Swallowing evaluation (BSE, MBS and/or diet order as indicated)   I-stat chem 8, ED   ED EKG   EKG 12-Lead   Saline lock IV  OTHER Significant initial  Findings:  labs showing:     DM  labs:  HbA1C: No results for input(s): HGBA1C in the last 8760 hours.     CBG (last 3)  No results for input(s): GLUCAP in the last 72 hours.        Cultures: No results found for: SDES, SPECREQUEST, CULT, REPTSTATUS   Radiological Exams on Admission: CT ANGIO HEAD NECK W WO CM Result Date: 12/31/2023 CLINICAL DATA:  Neuro deficit, acute, stroke suspected. Left-sided flaccid. Weakness. EXAM: CT ANGIOGRAPHY HEAD AND NECK WITH AND WITHOUT CONTRAST TECHNIQUE: Multidetector CT imaging of the head and neck was performed using the standard protocol during bolus administration of intravenous contrast. Multiplanar CT image reconstructions and MIPs were obtained to evaluate the vascular anatomy. Carotid stenosis measurements (when applicable) are obtained utilizing NASCET criteria, using the distal internal carotid diameter as the denominator. RADIATION DOSE REDUCTION: This exam was performed according to the departmental dose-optimization program which includes automated exposure control, adjustment of the mA and/or kV according to patient size and/or use of iterative reconstruction  technique. CONTRAST:  75mL OMNIPAQUE IOHEXOL 350 MG/ML SOLN COMPARISON:  Noncontrast head CT 12/22/2005. FINDINGS: CT HEAD FINDINGS Brain: Large acute right MCA territory infarct affecting the right frontal lobe/insula, temporal lobe, parietal lobe and occipital lobe, as well as the right basal ganglia region. Mild petechial hemorrhage questioned within portions of the infarction territory. Mild mass effect with partial effacement the right lateral ventricle. No midline shift. Small infarct within the left cerebellar hemisphere, new from the prior head CT of 12/22/2005 but otherwise age-indeterminate. No extra-axial fluid collection. No evidence of an intracranial mass. Vascular: No hyperdense vessel.  Atherosclerotic calcifications. Skull: No calvarial fracture or aggressive osseous lesion. Sinuses/Orbits: No orbital mass or acute orbital finding. 14 mm mucous retention cyst within the left maxillary sinus. Review of the MIP images confirms the above findings Non-contrast head CT impression #1 called by telephone at the time of interpretation on 12/31/2023 at 5:48 pm to provider DAVID YAO , who verbally acknowledged these results. CTA NECK FINDINGS Aortic arch: The proximal innominate, left common carotid and left subclavian arteries are excluded from the field of view. No hemodynamically significant stenosis within the visible innominate or proximal subclavian arteries. Right carotid system: CCA and ICA patent within the neck without stenosis or significant atherosclerotic disease. Left carotid system: The very proximal common carotid artery is excluded from the field of view. The visible common carotid and internal carotid arteries are patent within the neck without stenosis or significant atherosclerotic disease. Vertebral arteries: Codominant and patent within the neck without stenosis or significant atherosclerotic disease. Skeleton: Cervical spondylosis. No acute fracture or aggressive osseous lesion. The  patient is edentulous. Other neck: Multinodular thyroid  gland. The largest nodule is located within the inferior right thyroid  lobe (measuring 2 cm). Upper chest: No consolidation within the imaged lung apices. Review of the MIP images confirms the above findings CTA HEAD FINDINGS Anterior circulation: The intracranial internal carotid arteries are patent. Nonstenotic calcified plaque within both vessels. Severe, near occlusive stenosis of the right middle cerebral artery mid and distal M1 segment, and of an adjacent proximal right M2 branch. There is an eccentric filling defect at this site suggesting the presence of thrombus (for instance as seen on series 11, images 250 and 251). No left M2 proximal branch occlusion or high-grade proximal stenosis. The anterior cerebral arteries are patent. No intracranial aneurysm is identified. Posterior circulation: The intracranial vertebral arteries are patent. The basilar  artery is patent. The posterior cerebral arteries are patent. Hypoplastic right P1 segment with sizable right posterior communicating artery. A left posterior communicating artery is also present. Venous sinuses: Within the limitations of contrast timing, no convincing thrombus. Anatomic variants: As described. Review of the MIP images confirms the above findings CTA head impression #1 called by telephone at the time of interpretation on 12/31/2023 at 5:49 pm to provider DAVID YAO , who verbally acknowledged these results. IMPRESSION: Non-contrast head CT: 1. Large acute right MCA territory infarct with possible mild petechial hemorrhage. Mild mass effect with partial effacement of the right lateral ventricle. No midline shift. 2. Small infarct within the left cerebellar hemisphere, new from the prior head CT of 12/22/2005 but otherwise age-indeterminate. 3. 14 mm left maxillary sinus mucous retention cyst. CTA neck: 1. The very proximal innominate, left common carotid and left subclavian arteries are  excluded from the field of view. Within this limitation, findings are as follows. 2. The visible common carotid and internal carotid arteries are patent within the neck without stenosis or significant atherosclerotic disease. 3. Vertebral arteries patent within the neck without stenosis or significant atherosclerotic disease. 4. Multinodular thyroid  gland (with nodules measuring up to 2 cm). A nonemergent thyroid  ultrasound is recommended for further evaluation. CTA head: 1. Severe near-occlusive stenosis of the right middle cerebral artery mid and distal M1 segment, and of an adjacent proximal right M2 branch. Eccentric filling defect at this site suggesting the presence of thrombus. 2. Non-stenotic atherosclerotic plaque within the intracranial internal carotid arteries. Electronically Signed   By: Rockey Childs D.O.   On: 12/31/2023 18:11   DG Chest Port 1 View Result Date: 12/31/2023 CLINICAL DATA:  Altered mental status EXAM: PORTABLE CHEST 1 VIEW COMPARISON:  Chest x-ray 12/26/2011 FINDINGS: The heart size and mediastinal contours are within normal limits. Both lungs are clear. The visualized skeletal structures are unremarkable. IMPRESSION: No active disease. Electronically Signed   By: Greig Pique M.D.   On: 12/31/2023 15:48   _______________________________________________________________________________________________________ Latest  Blood pressure 132/89, pulse 92, temperature 99 F (37.2 C), temperature source Oral, resp. rate 19, SpO2 97%.   Vitals  labs and radiology finding personally reviewed  Review of Systems:    Pertinent positives include: LEft side weakness  Constitutional:  No weight loss, night sweats, Fevers, chills, fatigue, weight loss  HEENT:  No headaches, Difficulty swallowing,Tooth/dental problems,Sore throat,  No sneezing, itching, ear ache, nasal congestion, post nasal drip,  Cardio-vascular:  No chest pain, Orthopnea, PND, anasarca, dizziness, palpitations.no  Bilateral lower extremity swelling  GI:  No heartburn, indigestion, abdominal pain, nausea, vomiting, diarrhea, change in bowel habits, loss of appetite, melena, blood in stool, hematemesis Resp:  no shortness of breath at rest. No dyspnea on exertion, No excess mucus, no productive cough, No non-productive cough, No coughing up of blood.No change in color of mucus.No wheezing. Skin:  no rash or lesions. No jaundice GU:  no dysuria, change in color of urine, no urgency or frequency. No straining to urinate.  No flank pain.  Musculoskeletal:  No joint pain or no joint swelling. No decreased range of motion. No back pain.  Psych:  No change in mood or affect. No depression or anxiety. No memory loss.  Neuro: no localizing neurological complaints, no tingling, no weakness, no double vision, no gait abnormality, no slurred speech, no confusion  All systems reviewed and apart from HOPI all are negative _______________________________________________________________________________________________ Past Medical History:   Past Medical History:  Diagnosis Date  Arthritis    Hyperlipidemia    Hypertension       No past surgical history on file.  Social History:  Ambulatory   independently     reports that she quit smoking about 9 years ago. Her smoking use included cigarettes. She has never used smokeless tobacco. She reports that she does not drink alcohol and does not use drugs.    Family History:   Family History  Problem Relation Age of Onset   Diabetes Mother    Heart disease Father    Colon cancer Neg Hx    Esophageal cancer Neg Hx    Rectal cancer Neg Hx    Stomach cancer Neg Hx    ______________________________________________________________________________________________ Allergies: No Known Allergies   Prior to Admission medications   Medication Sig Start Date End Date Taking? Authorizing Provider  acetaminophen (TYLENOL) 500 MG tablet Take 500 mg by mouth  every 6 (six) hours as needed.    [provider]  aspirin 81 MG tablet Take 81 mg by mouth daily.    [provider]  atorvastatin  (LIPITOR) 20 MG tablet TAKE 1 TABLET(20 MG) BY MOUTH DAILY 10/01/23   Priscilla Speaks, FNP  fish oil-omega-3 fatty acids 1000 MG capsule Take 1 g by mouth daily.    [provider]  folic acid (FOLVITE) 1 MG tablet Take 1 mg by mouth daily.    [provider]  hydrochlorothiazide  (HYDRODIURIL ) 12.5 MG tablet TAKE 1 TABLET(12.5 MG) BY MOUTH DAILY 09/12/23   Priscilla Speaks, FNP  leflunomide (ARAVA) 10 MG tablet Take 10 mg by mouth daily.    [provider]  losartan  (COZAAR ) 100 MG tablet TAKE 1 TABLET(100 MG) BY MOUTH DAILY 10/01/23   Priscilla Speaks, FNP  methimazole (TAPAZOLE) 5 MG tablet Take 5 mg by mouth daily.    [provider]  Multiple Vitamins-Calcium  (ONE-A-DAY WOMENS FORMULA PO) Take 1 tablet by mouth daily.    [provider]  PEG-KCl-NaCl-NaSulf-Na Asc-C (PLENVU ) 140 g SOLR Take 140 g by mouth as directed. Manufacturer's coupon Universal coupon code:BIN: [id number]; GROUP: [id number]; PCN: [id number]; ID: [id number]; PAY NO MORE $50 09/02/21   Legrand Victory LITTIE DOUGLAS, MD  potassium chloride  (KLOR-CON  M) 10 MEQ tablet TAKE 1 TABLET(10 MEQ) BY MOUTH DAILY WITH FOOD 08/07/23   Priscilla Speaks, FNP    ___________________________________________________________________________________________________ Physical Exam:    12/31/2023    6:17 PM 12/31/2023    6:00 PM 12/31/2023    3:04 PM  Vitals with BMI  Systolic 132 140 857  Diastolic 89 88 100  Pulse 92 100 101     1. General:  in No  Acute distress   acutely ill -appearing 2. Psychological: Alert not   Oriented but follows commands 3. Head/ENT:    Dry Mucous Membranes                          Head Non traumatic, neck supple                           Poor Dentition 4. SKIN:  decreased Skin turgor,  Skin clean Dry and intact no rash    5. Heart: Regular rate and  rhythm no  Murmur, no Rub or gallop 6. Lungs: , no wheezes or crackles   7. Abdomen: Soft,  non-tender, Non distended bowel sounds present 8. Lower extremities: no clubbing, cyanosis, no  edema 9. Neurologically dense hemiparesis on  left with left facial droop right deviated gaze 10. MSK: Normal range of motion    Chart has been reviewed  ______________________________________________________________________________________________  Assessment/Plan  78 y.o. female with medical history significant of hyperthyroidism, RA, HTN, A.fib not on [facility]   Admitted for CVA Present on Admission:  Benign hypertension with CKD (chronic kidney disease) stage III (HCC)  Hyperthyroidism  Mixed hyperlipidemia  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 (HCC)  Dehydration  CVA (cerebral vascular accident) (HCC)  Rhabdomyolysis  Stroke (HCC)  AKI (acute kidney injury) (HCC)  UTI (urinary tract infection)  A-fib (HCC)  Hypernatremia     Benign hypertension with CKD (chronic kidney disease) stage III (HCC) Allow permissive hypertension  Hyperthyroidism Check TSH  Mixed hyperlipidemia When able to tolerate p.o. will start on Lipitor 20 mg a day for his needs to pass swallow eval If not family is interested in placing feeding tube  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 (HCC) Chronic stable hold Arava if unable to tolerate p.o.  Dehydration Will rehydrate with IV fluids  CVA (cerebral vascular accident) [facility])  - will admit based on TIA/CVA protocol,        Monitor on Tele       MRA/MRI  Resulted - showing acute ischemic CVA Large acute right MCA territory infarct with possible mild petechial hemorrhage. Mild mass effect with partial effacement of the right lateral ventricle. No midline shift. Small infarct within the left cerebellar hemisphere, new from the prior head CT        CTA Severe near-occlusive stenosis of the right middle  cerebral artery mid and distal M1 segment, and of an adjacent proximal right M2 branch. Eccentric filling defect at this site suggesting the presence of thrombus. 2. Non-stenotic atherosclerotic plaque within the intracranial internal carotid arteries.       Echo to evaluate for possible embolic source,   ECG possible A.fib/flutter not a candidate for [facility] tonight given massive CVA       obtain cardiac enzymes,  ECG,   Lipid panel, TSH.        Order PT/OT evaluation.        keep nothing by mouth until passes swallow eval  will need Speech pathology evaluation       Will make sure patient is on antiplatelet if able to tolerate PO  Aspirin 81 p.o. Plavix 75 but for now will order aspirin suppository       Allow permissive Hypertension keep BP <220/120        Neurology consulted Have seen pt in ER     Rhabdomyolysis In the setting of dehydration patient found down Hold statin for now rehydrate and follow  AKI (acute kidney injury) (HCC) -  evidence of acute renal failure due to presence of following: Cr increased >0.3 from baseline   likely secondary to dehydration,       check FeNA       Rehydrate with IV fluids        History does not suggest urinary retention or obstruction           UTI (urinary tract infection)  - treat with Rocephin        await results of urine culture and adjust antibiotic coverage as needed   A-fib (HCC) Possibly new diagnosis although had EKG in 2024 which was read as A-fib as well patient's family states that she was never told she has this has not been taking any anticoagulation .  Review of records patient was noted to have A-fib in November 2024 and was supposed to be referred to cardiology  it is unclear if she has ever been seen  Will benefit from cardiology consult Discussed with Neurology given massive CVA would not be a good candidate for [facility] tonight will defer to stroke team  Obtain echo to eval for embolic source   Hypernatremia Mild in  the setting of dehydration  Rehydrate and repeat   Other plan as per orders.  DVT prophylaxis:  SCD       Code Status:    Code Status: Not on file FULL CODE  as per family  I had personally discussed CODE STATUS with  family  ACP   none    Family Communication:   Family not at  Bedside  plan of care was discussed on the phone with Daughter,    Diet  Diet Orders (From admission, onward)     Start     Ordered   12/31/23 1506  Diet NPO time specified  Diet effective now       Comments: NPO until stroke swallow screen is complete   12/31/23 1505            Disposition Plan:     likely will need placement for rehabilitation                           Following barriers for discharge:                              Stroke  work up is complete                            Electrolytes corrected                                                           Will need consultants to evaluate patient prior to discharge                                 Consult Orders  (From admission, onward)           Start     Ordered   12/31/23 1754  Consult to hospitalist  Pg sent by deloris  Once       Provider:  (Not yet assigned)  Question Answer Comment  Place call to: [facility] Hospitalist   Reason for Consult Admit      12/31/23 1753   12/31/23 1541  SLP eval and treat Reason for evaluation: .Swallowing evaluation (BSE, MBS and/or diet order as indicated)  Once       Question:  Reason for evaluation  Answer:  .Swallowing evaluation (BSE, MBS and/or diet order as indicated)   12/31/23 1540                               Would benefit from PT/OT eval prior to DC  Ordered                   Swallow eval - SLP  ordered                                      Transition of care consulted                                       Consults called: Neurology is aware Will email cardiology    Admission status:  ED Disposition     ED Disposition  Admit   Condition  --   Comment   Hospital Area: [facility] [[id number]]  Level of Care: Telemetry Medical [104]  May admit patient to [facility] or [facility] if equivalent level of care is available:: No  Covid Evaluation: Asymptomatic - no recent exposure (last 10 days) testing not required  Diagnosis: Stroke [facility]) [[id number]]  Admitting Physician: Jilene Spohr [[id number]]  Attending Physician: Lanyah Spengler [[id number]]  Certification:: I certify this patient will need inpatient services for at least 2 midnights  Expected Medical Readiness: 01/03/2024            inpatient     I Expect 2 midnight stay secondary to severity of patient's current illness need for inpatient interventions justified by the following:     Severe lab/radiological/exam abnormalities including:   CVA    That are currently affecting medical management.   I expect  patient to be hospitalized for 2 midnights requiring inpatient medical care.  Patient is at high risk for adverse outcome (such as loss of life or disability) if not treated.  Indication for inpatient stay as follows:  Severe change from baseline regarding mental status  inability to maintain oral hydration   Need for operative/procedural  intervention    Need for IV fluids     Level of care     tele  For   24H        Addy Mcmannis 12/31/2023, 11:33 PM    [facility]     after 2 AM please page floor coverage   If 7AM-7PM, please contact the day team taking care of the patient using [email or web address]

## 2023-12-31 NOTE — Assessment & Plan Note (Signed)
-    evidence of acute renal failure due to presence of following: Cr increased >0.3 from baseline   likely secondary to dehydration,       check FeNA       Rehydrate with IV fluids        History does not suggest urinary retention or obstruction

## 2023-12-31 NOTE — Assessment & Plan Note (Signed)
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 Mild mass effect with partial effacement of the right lateral ventricle. No midline shift. 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 Eccentric filling defect at this site suggesting the presence of thrombus. 2. Non-stenotic atherosclerotic plaque within the intracranial internal carotid arteries.       Echo to evaluate for possible embolic source,   ECG possible A.fib/flutter not a candidate for [facility] tonight given massive CVA       obtain cardiac enzymes,  ECG,   Lipid panel, TSH.        Order PT/OT evaluation.        keep nothing by mouth until passes swallow eval  will need Speech pathology evaluation       Will make sure patient is on antiplatelet if able to tolerate PO  Aspirin 81 p.o. Plavix 75 but for now will order aspirin suppository       Allow permissive Hypertension keep BP <220/120        Neurology consulted Have seen pt in ER

## 2023-12-31 NOTE — Assessment & Plan Note (Signed)
Will rehydrate with IV fluids ? ?

## 2023-12-31 NOTE — Assessment & Plan Note (Signed)
 Possibly new diagnosis although had EKG in 2024 which was read as A-fib as well patient's family states that she was never told she has this has not been taking any anticoagulation .  Review of records patient was noted to have A-fib in November 2024 and was supposed to be referred to cardiology  it is unclear if she has ever been seen  Will benefit from cardiology consult Discussed with Neurology given massive CVA would not be a good candidate for [facility] tonight will defer to stroke team  Obtain echo to eval for embolic source

## 2023-12-31 NOTE — Assessment & Plan Note (Addendum)
 When able to tolerate p.o. will start on Lipitor 20 mg a day for his needs to pass swallow eval If not family is interested in placing feeding tube

## 2023-12-31 NOTE — ED Triage Notes (Signed)
 Pt BIB [facility] from home due to wellness check as family did not hear from patient since Saturday 12/29/2023 at 1000 in the morning.  Pt presents with left sided flaccidity and weakness.  Pt did have Cataract surgery Friday 6/20/205.  18g bilateral AC. 400ml NS given en route. VS BP 130/70, CBG 145, SpO2 96% RA

## 2023-12-31 NOTE — Assessment & Plan Note (Signed)
 Chronic stable hold Arava if unable to tolerate p.o.

## 2023-12-31 NOTE — Assessment & Plan Note (Signed)
 Mild in the setting of dehydration  Rehydrate and repeat

## 2023-12-31 NOTE — Assessment & Plan Note (Signed)
 Allow permissive hypertension

## 2023-12-31 NOTE — Assessment & Plan Note (Signed)
-   treat with Rocephin         await results of urine culture and adjust antibiotic coverage as needed  

## 2023-12-31 NOTE — Assessment & Plan Note (Signed)
 In the setting of dehydration patient found down Hold statin for now rehydrate and follow

## 2023-12-31 NOTE — Assessment & Plan Note (Addendum)
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 Mild mass effect with partial effacement of the right lateral ventricle. No midline shift. 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 Eccentric filling defect at this site suggesting the presence of thrombus. 2. Non-stenotic atherosclerotic plaque within the intracranial internal carotid arteries.       Echo to evaluate for possible embolic source,   ECG possible A.fib/flutter not a candidate for [facility] tonight given massive CVA       obtain cardiac enzymes,  ECG,   Lipid panel, TSH.        Order PT/OT evaluation.        keep nothing by mouth until passes swallow eval  will need Speech pathology evaluation       Will make sure patient is on antiplatelet if able to tolerate PO  Aspirin 81 p.o. Plavix 75 but for now will order aspirin suppository       Allow permissive Hypertension keep BP <220/120        Neurology consulted Have seen pt in ER

## 2024-01-01 ENCOUNTER — Encounter (HOSPITAL_COMMUNITY): Payer: Self-pay | Admitting: Internal Medicine

## 2024-01-01 ENCOUNTER — Inpatient Hospital Stay (HOSPITAL_COMMUNITY)

## 2024-01-01 ENCOUNTER — Encounter (HOSPITAL_COMMUNITY)

## 2024-01-01 DIAGNOSIS — I63419 Cerebral infarction due to embolism of unspecified middle cerebral artery: Secondary | ICD-10-CM

## 2024-01-01 DIAGNOSIS — I69391 Dysphagia following cerebral infarction: Secondary | ICD-10-CM | POA: Diagnosis not present

## 2024-01-01 DIAGNOSIS — I4891 Unspecified atrial fibrillation: Secondary | ICD-10-CM

## 2024-01-01 DIAGNOSIS — E870 Hyperosmolality and hypernatremia: Secondary | ICD-10-CM | POA: Diagnosis not present

## 2024-01-01 DIAGNOSIS — I480 Paroxysmal atrial fibrillation: Secondary | ICD-10-CM | POA: Diagnosis not present

## 2024-01-01 DIAGNOSIS — E0590 Thyrotoxicosis, unspecified without thyrotoxic crisis or storm: Secondary | ICD-10-CM | POA: Diagnosis not present

## 2024-01-01 DIAGNOSIS — I6389 Other cerebral infarction: Secondary | ICD-10-CM | POA: Diagnosis not present

## 2024-01-01 DIAGNOSIS — I63311 Cerebral infarction due to thrombosis of right middle cerebral artery: Secondary | ICD-10-CM | POA: Diagnosis not present

## 2024-01-01 DIAGNOSIS — I63411 Cerebral infarction due to embolism of right middle cerebral artery: Secondary | ICD-10-CM | POA: Diagnosis not present

## 2024-01-01 DIAGNOSIS — I129 Hypertensive chronic kidney disease with stage 1 through stage 4 chronic kidney disease, or unspecified chronic kidney disease: Secondary | ICD-10-CM | POA: Diagnosis not present

## 2024-01-01 DIAGNOSIS — R29719 NIHSS score 19: Secondary | ICD-10-CM | POA: Diagnosis not present

## 2024-01-01 LAB — BASIC METABOLIC PANEL WITH GFR
Anion gap: 12 (ref 5–15)
BUN: 31 mg/dL — ABNORMAL HIGH (ref 8–23)
CO2: 22 mmol/L (ref 22–32)
Calcium: 8.4 mg/dL — ABNORMAL LOW (ref 8.9–10.3)
Chloride: 110 mmol/L (ref 98–111)
Creatinine, Ser: 1.16 mg/dL — ABNORMAL HIGH (ref 0.44–1.00)
GFR, Estimated: 49 mL/min — ABNORMAL LOW (ref >60)
Glucose, Bld: 107 mg/dL — ABNORMAL HIGH (ref 70–99)
Potassium: 3.8 mmol/L (ref 3.5–5.1)
Sodium: 144 mmol/L (ref 135–145)

## 2024-01-01 LAB — LIPID PANEL
Cholesterol: 139 mg/dL (ref 0–200)
HDL: 32 mg/dL — ABNORMAL LOW (ref >40)
LDL Cholesterol: 85 mg/dL (ref 0–99)
Total CHOL/HDL Ratio: 4.3 ratio
Triglycerides: 109 mg/dL (ref <150)
VLDL: 22 mg/dL (ref 0–40)

## 2024-01-01 LAB — HEMOGLOBIN A1C
Hgb A1c MFr Bld: 6 % — ABNORMAL HIGH (ref 4.8–5.6)
Mean Plasma Glucose: 125.5 mg/dL

## 2024-01-01 LAB — ECHOCARDIOGRAM COMPLETE
AR max vel: 2.54 cm2
AV Peak grad: 3 mmHg
Ao pk vel: 0.87 m/s
Area-P 1/2: 4.06 cm2
Height: 65 in
S' Lateral: 3.6 cm
Weight: 2134.05 [oz_av]

## 2024-01-01 LAB — T4, FREE: Free T4: 0.76 ng/dL (ref 0.61–1.12)

## 2024-01-01 LAB — OSMOLALITY: Osmolality: 319 mosm/kg — ABNORMAL HIGH (ref 275–295)

## 2024-01-01 LAB — PHOSPHORUS: Phosphorus: 2.8 mg/dL (ref 2.5–4.6)

## 2024-01-01 LAB — MAGNESIUM: Magnesium: 1.4 mg/dL — ABNORMAL LOW (ref 1.7–2.4)

## 2024-01-01 LAB — TSH: TSH: 0.366 u[IU]/mL (ref 0.350–4.500)

## 2024-01-01 MED ORDER — ACETAMINOPHEN 325 MG PO TABS
650.0000 mg | ORAL_TABLET | ORAL | Status: DC | PRN
  Administered 2024-01-05 – 2024-01-08 (×4): 650 mg via ORAL
  Filled 2024-01-01 (×4): qty 2

## 2024-01-01 MED ORDER — ACETAMINOPHEN 160 MG/5ML PO SOLN
650.0000 mg | ORAL | Status: DC | PRN
  Administered 2024-01-02 – 2024-01-06 (×3): 650 mg
  Filled 2024-01-01 (×3): qty 20.3

## 2024-01-01 MED ORDER — KCL IN DEXTROSE-NACL 20-5-0.45 MEQ/L-%-% IV SOLN
INTRAVENOUS | Status: DC
  Filled 2024-01-01 (×3): qty 1000

## 2024-01-01 MED ORDER — ASPIRIN 300 MG RE SUPP
300.0000 mg | Freq: Every day | RECTAL | Status: DC
  Administered 2024-01-02: 300 mg via RECTAL
  Filled 2024-01-01: qty 1

## 2024-01-01 MED ORDER — ACETAMINOPHEN 650 MG RE SUPP: 650.0000 mg | RECTAL | Status: DC | PRN

## 2024-01-01 MED ORDER — SODIUM CHLORIDE 0.9 % IV SOLN: INTRAVENOUS | Status: DC

## 2024-01-01 MED ORDER — ORAL CARE MOUTH RINSE: 15.0000 mL | OROMUCOSAL | Status: DC | PRN

## 2024-01-01 MED ORDER — STROKE: EARLY STAGES OF RECOVERY BOOK
Freq: Once | Status: AC
  Filled 2024-01-01: qty 1

## 2024-01-01 MED ORDER — MAGNESIUM SULFATE 2 GM/50ML IV SOLN
2.0000 g | Freq: Once | INTRAVENOUS | Status: AC
  Administered 2024-01-01: 2 g via INTRAVENOUS
  Filled 2024-01-01: qty 50

## 2024-01-01 NOTE — Assessment & Plan Note (Signed)
-   Resume Arava when cortrak placed

## 2024-01-01 NOTE — Assessment & Plan Note (Addendum)
 See above.  Rate controlled.  CHA2DS2-Vasc 5 (age, HTN, stroke) - Follow echo - Hold Eliquis until cleared by Neurology, likely 3-5 days

## 2024-01-01 NOTE — Assessment & Plan Note (Signed)
-   Resume Lipitor when cortrak placed

## 2024-01-01 NOTE — ED Notes (Signed)
 Speech therapy at bedside.

## 2024-01-01 NOTE — Consult Note (Addendum)
 Cardiology Consultation   Patient ID: Priscilla Thornton MRN: [id number]; DOB: 23-Jan-1946  Admit date: 12/31/2023 Date of Consult: 01/01/2024  PCP:  Priscilla Speaks, FNP   [facility] Providers Cardiologist:  None      Patient Profile: Priscilla Thornton is a 78 y.o. female with a hx of HLD, HTN, hypothyroidism, CKD, arthritis who is being seen 01/01/2024 for the evaluation of atrial fibrillation at the request of Dr. Jonel.  History of Present Illness: Priscilla Thornton is a 78 year old female with above medical history. Per chart review, patient's PCP diagnosed her with atrial fibrillation in 04/2023. EKG from that office appointment showed atrial fibrillation with HR 91 BPM, incomplete RBBB. Patient was referred to cardiology but was never seen. Looking back at prior EKGs, EKG from 04/2022 also showed atrial fibrillation. Was on aspirin 81 mg daily   Patient was brought in by EMS on 6/23. Patient's family had called a wellness check on her because they had not heard from her since Saturday 6/21. When police arrived, she was on her bed and was covered in urine. She appeared to have left sided weakness. In the ED, HR 101 BPM, BP 142/100 BPM, oxygen 97% on room air.   Labs significant for  - K 4.2 - creatinine 1.30 - CK 755 - WBC 4.9 - Hemoglobin 11.6 - Platelets 312  - UA with many bacteria, leukocytes   CXR showed no active disease. CTA head and neck showed severe near-occlusive stenosis of the right middle cerebral artery and distal M1 segment and of an adjacent proximal right M2 branch. Eccentric filling defect at this site suggesting the presence of thrombus. EKG in the ED showed atrial fibrillation with HR 109 BPM.   Neurology was consulted. Per their note, strongly suspect stroke was cardioembolic. She was admitted to the internal medicine service. Cardiology consulted for atrial fibrillation   Patient currently not speaking. Her granddaughters are at bedside and her  daughter called on speaker phone. Her family reports that she does not have any cardiac history that they are aware of. She was not on any blood thinners prior to admission. She was very independent and was able to manage most of her health concerns independently. She does have a history of hyperthyroidism and she was compliant with her medications    Past Medical History:  Diagnosis Date   Arthritis    Hyperlipidemia    Hypertension     History reviewed. No pertinent surgical history.    Scheduled Meds:   stroke: early stages of recovery book   Does not apply Once   aspirin  300 mg Rectal Daily   Continuous Infusions:  sodium chloride  75 mL/hr at 01/01/24 0856   cefTRIAXone (ROCEPHIN)  IV Stopped (01/01/24 0012)   PRN Meds: acetaminophen **OR** acetaminophen (TYLENOL) oral liquid 160 mg/5 mL **OR** acetaminophen  Allergies:   No Known Allergies  Social History:   Social History   Socioeconomic History   Marital status: Single    Spouse name: Not on file   Number of children: Not on file   Years of education: Not on file   Highest education level: Not on file  Occupational History   Occupation: retired  Tobacco Use   Smoking status: Former    Current packs/day: 0.00    Types: Cigarettes    Quit date: 2016    Years since quitting: 9.4   Smokeless tobacco: Never   Tobacco comments:    2-3 cigaretts a day  Vaping Use  Vaping status: Never Used  Substance and Sexual Activity   Alcohol use: No   Drug use: No   Sexual activity: Not Currently  Other Topics Concern   Not on file  Social History Narrative   Not on file   Social Drivers of Health   Financial Resource Strain: Low Risk  (01/25/2022)   Overall Financial Resource Strain (CARDIA)    Difficulty of Paying Living Expenses: Not hard at all  Food Insecurity: No Food Insecurity (01/25/2022)   Hunger Vital Sign    Worried About Running Out of Food in the Last Year: Never true    Ran Out of Food in the Last  Year: Never true  Transportation Needs: No Transportation Needs (01/25/2022)   PRAPARE - Administrator, Civil Service (Medical): No    Lack of Transportation (Non-Medical): No  Physical Activity: Inactive (01/25/2022)   Exercise Vital Sign    Days of Exercise per Week: 0 days    Minutes of Exercise per Session: 0 min  Stress: No Stress Concern Present (01/25/2022)   [employer] of Occupational Health - Occupational Stress Questionnaire    Feeling of Stress : Not at all  Social Connections: Not on file  Intimate Partner Violence: Not At Risk (12/19/2018)   Humiliation, Afraid, Rape, and Kick questionnaire    Fear of Current or Ex-Partner: No    Emotionally Abused: No    Physically Abused: No    Sexually Abused: No    Family History:   Family History  Problem Relation Age of Onset   Diabetes Mother    Heart disease Father    Colon cancer Neg Hx    Esophageal cancer Neg Hx    Rectal cancer Neg Hx    Stomach cancer Neg Hx      ROS:  Please see the history of present illness.   All other ROS reviewed and negative.     Physical Exam/Data: Vitals:   01/01/24 0845 01/01/24 0900 01/01/24 1045 01/01/24 1115  BP: (!) 142/88 (!) 137/93 (!) 140/79 (!) 147/94  Pulse: 72 88 81 87  Resp: 12 19 (!) 23 (!) 23  Temp:      TempSrc:      SpO2: 98% 99% 97% 99%    Intake/Output Summary (Last 24 hours) at 01/01/2024 1123 Last data filed at 01/01/2024 0012 Gross per 24 hour  Intake 100 ml  Output --  Net 100 ml      08/14/2023    3:44 PM 04/23/2023   10:09 AM 12/11/2022   11:02 AM  Last 3 Weights  Weight (lbs) 134 lb 134 lb 9.6 oz 135 lb 3.2 oz  Weight (kg) 60.782 kg 61.054 kg 61.326 kg     There is no height or weight on file to calculate BMI.  General:  Thin, elderly female. Sitting upright in the bed in no acute distress. Head turned to the right  Neck: no JVD Vascular: No carotid bruits; Radial pulses 2+ bilaterally  Cardiac:  normal S1, S2; irregular rate and  rhythm  Lungs:  clear to auscultation bilaterally  Abd: soft, nontender, no hepatomegaly  Ext: no edema Musculoskeletal:  No deformities Skin: warm and dry   EKG:  The EKG was personally reviewed and demonstrates:   Initial EKG in the ED showed coarse atrial fibrillation with HR 109 BPM  Telemetry:  Telemetry was personally reviewed and demonstrates:  Atrial fibrillation with HR in the 70s-100s   Relevant CV Studies:  Laboratory Data:  High Sensitivity Troponin:  No results for input(s): TROPONINIHS in the last 720 hours.   Chemistry Recent Labs  Lab 12/31/23 1506 12/31/23 1521 12/31/23 2318 12/31/23 2348  NA 145 146* 144  --   K 4.2 4.0 3.8  --   CL 109 109 110  --   CO2 24  --  22  --   GLUCOSE 128* 125* 107*  --   BUN 32* 33* 31*  --   CREATININE 1.30* 1.20* 1.16*  --   CALCIUM  9.1  --  8.4*  --   MG  --   --   --  1.4*  GFRNONAA 42*  --  49*  --   ANIONGAP 12  --  12  --     Recent Labs  Lab 12/31/23 1506  PROT 6.9  ALBUMIN 3.4*  AST 41  ALT 16  ALKPHOS 45  BILITOT 0.9   Lipids  Recent Labs  Lab 01/01/24 0913  CHOL 139  TRIG 109  HDL 32*  LDLCALC 85  CHOLHDL 4.3    Hematology Recent Labs  Lab 12/31/23 1506 12/31/23 1521  WBC 4.9  --   RBC 4.31  --   HGB 11.6* 12.6  HCT 38.0 37.0  MCV 88.2  --   MCH 26.9  --   MCHC 30.5  --   RDW 13.4  --   PLT 312  --    Thyroid   Recent Labs  Lab 12/31/23 2348  TSH 0.366  FREET4 0.76    BNPNo results for input(s): BNP, PROBNP in the last 168 hours.  DDimer No results for input(s): DDIMER in the last 168 hours.  Radiology/Studies:  MR BRAIN WO CONTRAST Result Date: 01/01/2024 CLINICAL DATA:  Provided history: Stroke, follow-up. EXAM: MRI HEAD WITHOUT CONTRAST TECHNIQUE: Multiplanar, multiecho pulse sequences of the brain and surrounding structures were obtained without intravenous contrast. COMPARISON:  Non-contrast head CT and CT angiogram head/neck 12/31/2023. FINDINGS: Brain: Large acute  right middle cerebral artery territory infarct again demonstrated (affecting the cortical/subcortical frontal lobe/insula, temporal lobe, parietal lobe and lateral occipital lobe, as well as basal ganglia region). Associated mass effect with partial effacement of the right lateral ventricle. No midline shift. Petechial hemorrhage within portions of the infarction territory in the right basal ganglia region. Mild multifocal T2 FLAIR hyperintense signal abnormality elsewhere within the cerebral white matter, nonspecific but compatible chronic small vessel ischemic disease. 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 No evidence of an intracranial mass. No extra-axial fluid collection. Vascular: Please see CTA head/neck performed 12/31/2023. Skull and upper cervical spine: No focal worrisome marrow lesion. Sinuses/Orbits: No mass or acute finding within the imaged orbits. Prior bilateral ocular lens replacement. 15 mm left maxillary sinus mucous retention cyst. IMPRESSION: 1. 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 As before, there is associated mass effect with partial effacement the right lateral ventricle. No midline shift. Petechial hemorrhage present within portions of the infarction territory in the right basal ganglia region. 2. Background mild cerebral white matter chronic small vessel ischemic disease. 3. 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 4. 15 mm left maxillary sinus mucous retention cyst. Electronically Signed   By: Rockey Childs D.O.   On: 01/01/2024 11:18   CT ANGIO HEAD NECK W WO CM Addendum Date: 12/31/2023 ADDENDUM REPORT: 12/31/2023 20:46 ADDENDUM: Reference for recommendation in CTA neck impression #4: J Am Coll Radiol. 2015  Feb;12(2): 143-50. Electronically Signed   By: Rockey Childs D.O.   On: 12/31/2023 20:46   Result Date: 12/31/2023 CLINICAL DATA:  Neuro  deficit, acute, stroke suspected. Left-sided flaccid. Weakness. EXAM: CT ANGIOGRAPHY HEAD AND NECK WITH AND WITHOUT CONTRAST TECHNIQUE: Multidetector CT imaging of the head and neck was performed using the standard protocol during bolus administration of intravenous contrast. Multiplanar CT image reconstructions and MIPs were obtained to evaluate the vascular anatomy. Carotid stenosis measurements (when applicable) are obtained utilizing NASCET criteria, using the distal internal carotid diameter as the denominator. RADIATION DOSE REDUCTION: This exam was performed according to the departmental dose-optimization program which includes automated exposure control, adjustment of the mA and/or kV according to patient size and/or use of iterative reconstruction technique. CONTRAST:  75mL OMNIPAQUE IOHEXOL 350 MG/ML SOLN COMPARISON:  Noncontrast head CT 12/22/2005. FINDINGS: CT HEAD FINDINGS Brain: Large acute right MCA territory infarct affecting the right frontal lobe/insula, temporal lobe, parietal lobe and occipital lobe, as well as the right basal ganglia region. Mild petechial hemorrhage questioned within portions of the infarction territory. Mild mass effect with partial effacement the right lateral ventricle. No midline shift. Small infarct within the left cerebellar hemisphere, new from the prior head CT of 12/22/2005 but otherwise age-indeterminate. No extra-axial fluid collection. No evidence of an intracranial mass. Vascular: No hyperdense vessel.  Atherosclerotic calcifications. Skull: No calvarial fracture or aggressive osseous lesion. Sinuses/Orbits: No orbital mass or acute orbital finding. 14 mm mucous retention cyst within the left maxillary sinus. Review of the MIP images confirms the above findings Non-contrast head CT impression #1 called by telephone at the time of interpretation on 12/31/2023 at 5:48 pm to provider DAVID YAO , who verbally acknowledged these results. CTA NECK FINDINGS Aortic arch:  The proximal innominate, left common carotid and left subclavian arteries are excluded from the field of view. No hemodynamically significant stenosis within the visible innominate or proximal subclavian arteries. Right carotid system: CCA and ICA patent within the neck without stenosis or significant atherosclerotic disease. Left carotid system: The very proximal common carotid artery is excluded from the field of view. The visible common carotid and internal carotid arteries are patent within the neck without stenosis or significant atherosclerotic disease. Vertebral arteries: Codominant and patent within the neck without stenosis or significant atherosclerotic disease. Skeleton: Cervical spondylosis. No acute fracture or aggressive osseous lesion. The patient is edentulous. Other neck: Multinodular thyroid  gland. The largest nodule is located within the inferior right thyroid  lobe (measuring 2 cm). Upper chest: No consolidation within the imaged lung apices. Review of the MIP images confirms the above findings CTA HEAD FINDINGS Anterior circulation: The intracranial internal carotid arteries are patent. Nonstenotic calcified plaque within both vessels. Severe, near occlusive stenosis of the right middle cerebral artery mid and distal M1 segment, and of an adjacent proximal right M2 branch. There is an eccentric filling defect at this site suggesting the presence of thrombus (for instance as seen on series 11, images 250 and 251). No left M2 proximal branch occlusion or high-grade proximal stenosis. The anterior cerebral arteries are patent. No intracranial aneurysm is identified. Posterior circulation: The intracranial vertebral arteries are patent. The basilar artery is patent. The posterior cerebral arteries are patent. Hypoplastic right P1 segment with sizable right posterior communicating artery. A left posterior communicating artery is also present. Venous sinuses: Within the limitations of contrast timing,  no convincing thrombus. Anatomic variants: As described. Review of the MIP images confirms the above findings CTA head impression #1  called by telephone at the time of interpretation on 12/31/2023 at 5:49 pm to provider DAVID YAO , who verbally acknowledged these results. IMPRESSION: Non-contrast head CT: 1. Large acute right MCA territory infarct with possible mild petechial hemorrhage. Mild mass effect with partial effacement of the right lateral ventricle. No midline shift. 2. Small infarct within the left cerebellar hemisphere, new from the prior head CT of 12/22/2005 but otherwise age-indeterminate. 3. 14 mm left maxillary sinus mucous retention cyst. CTA neck: 1. The very proximal innominate, left common carotid and left subclavian arteries are excluded from the field of view. Within this limitation, findings are as follows. 2. The visible common carotid and internal carotid arteries are patent within the neck without stenosis or significant atherosclerotic disease. 3. Vertebral arteries patent within the neck without stenosis or significant atherosclerotic disease. 4. Multinodular thyroid  gland (with nodules measuring up to 2 cm). A nonemergent thyroid  ultrasound is recommended for further evaluation. CTA head: 1. Severe near-occlusive stenosis of the right middle cerebral artery mid and distal M1 segment, and of an adjacent proximal right M2 branch. Eccentric filling defect at this site suggesting the presence of thrombus. 2. Non-stenotic atherosclerotic plaque within the intracranial internal carotid arteries. Electronically Signed: By: Rockey Childs D.O. On: 12/31/2023 18:11   DG Chest Port 1 View Result Date: 12/31/2023 CLINICAL DATA:  Altered mental status EXAM: PORTABLE CHEST 1 VIEW COMPARISON:  Chest x-ray 12/26/2011 FINDINGS: The heart size and mediastinal contours are within normal limits. Both lungs are clear. The visualized skeletal structures are unremarkable. IMPRESSION: No active disease.  Electronically Signed   By: Greig Pique M.D.   On: 12/31/2023 15:48     Assessment and Plan:  Atrial Fibrillation  - Patient was first found to be in atrial fibrillation in 04/2022. EKG in 04/2023 also showed atrial fibrillation. 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 HR overall well controlled in the 70s-90s - TSH, free T4 within normal limits.K 3.8, mag 1.4.  Recommend repleting for K>4, mag >2  - Echocardiogram pending - Plan to start DOAC once cleared by neurology  - Currently allowing permissive HTN. BP in the 140s/90s. HR overall well controlled. No BB or CCB for now   Otherwise per primary  - CVA  - CKD - Dehydration  - Rheumatoid arthritis    Risk Assessment/Risk Scores:  CHA2DS2-VASc Score = 7  This indicates a 11.2% annual risk of stroke. The patient's score is based upon: CHF History: 0 HTN History: 1 Diabetes History: 0 Stroke History: 2 Vascular Disease History: 1 Age Score: 2 Gender Score: 1    For questions or updates, please contact [facility] Please consult [email or web address] for contact info under    Signed, Rollo FABIENE Louder, PA-C  01/01/2024 11:23 AM  Personally seen and examined. Agree with above.  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  Discussed with family at length.  On exam comfortable, laying in bed, head turned to the right, sleeping with heart irregularly irregular, lungs clear no edema  Creatinine 1.3 CK7 55, hemoglobin 11.6, BMI 22  Awaiting echocardiogram  Acute stroke MCA in the setting of A-fib - Await for neurology's recommendation on timing for Eliquis 5 mg twice a day.  Discussed with family the risk of hemorrhagic transformation in the setting of large  stroke. - Currently well rate controlled without AV nodal blocking  agent. -No urgency for cardioversion.  No further recommendations at this time.  Please let us  know if we can be of further assistance.  We will sign off.  Oneil Parchment, MD

## 2024-01-01 NOTE — Assessment & Plan Note (Addendum)
 MRI brain showed large acute right middle cerebral artery territory infarct with petechial hemorrhage.   - Non-invasive angiography showed discrete right MCA occlusion - Echocardiogram pending - Carotid imaging complete, no high grade stenosis - Lipids ordered: LDL 85, will increase statin when Cortrak in palce - Aspirin 300 rectal daily - Evaluation for arrhythmia/atrial fibrillation: new onset, not previously on anticoagulation, discussed with Neuro, wait 3-5 days  - tPA not given because outside window - SLP recommend NPO - IV fluids - Place cortrak - PT eval ordered - Nonsmoker  - Consult Palliative Care

## 2024-01-01 NOTE — Assessment & Plan Note (Signed)
-   Permissive HTN - Hold losartan , HCTZ

## 2024-01-01 NOTE — ED Notes (Signed)
Shift report received, assumed care of patient at this time 

## 2024-01-01 NOTE — ED Notes (Signed)
 Transported to MRI. IV fluids paused.

## 2024-01-01 NOTE — Evaluation (Signed)
 Clinical/Bedside Swallow Evaluation Patient Details  Name: Priscilla Thornton MRN: [id number] Date of Birth: 11/11/1945  Today's Date: 01/01/2024 Time: SLP Start Time (ACUTE ONLY): 0912 SLP Stop Time (ACUTE ONLY): 0927 SLP Time Calculation (min) (ACUTE ONLY): 15 min  Past Medical History:  Past Medical History:  Diagnosis Date   Arthritis    Hyperlipidemia    Hypertension    Past Surgical History: History reviewed. No pertinent surgical history. HPI:  Patient is a 78 y.o. female with PMH: RA, HTN, a-fib not on anticoagulant. She works full time at [employer] as a Scientist, physiological. She was found down on 12/31/23 during a well check after family had not heard from her since Saturday (6/21). 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 MRI pending.    Assessment / Plan / Recommendation  Clinical Impression  Patient presents with clinical s/s of dysphagia as per this bedside swallow evaluation. She was awake, alert and able to hold cup and give self small sips after SLP setup and some tactile cues. She exhibits limited bilabial and lingual strength and movement, leading to anterior spillage of liquids. With thin liquids (water), she exhibited delayed swallow initiation and delayed cough response. She exhibited oral residuals with puree solids (applesauce) requring oral care with toothette sponge to remove. SLP will follow for PO readiness trials. SLP Visit Diagnosis: Dysphagia, unspecified (R13.10)    Aspiration Risk  Moderate aspiration risk;Risk for inadequate nutrition/hydration    Diet Recommendation NPO    Medication Administration: Via alternative means    Other  Recommendations Oral Care Recommendations: Oral care QID     Assistance Recommended at Discharge    Functional Status Assessment Patient has had a recent decline in their functional status and demonstrates the ability to make significant improvements in function in a reasonable and  predictable amount of time.  Frequency and Duration min 2x/week          Prognosis Prognosis for improved oropharyngeal function: Good Barriers to Reach Goals: Severity of deficits      Swallow Study   General Date of Onset: 12/31/23 HPI: Patient is a 78 y.o. female with PMH: RA, HTN, a-fib not on anticoagulant. She works full time at [employer] as a Scientist, physiological. She was found down on 12/31/23 during a well check after family had not heard from her since Saturday (6/21). 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 MRI pending. Type of Study: Bedside Swallow Evaluation Previous Swallow Assessment: none found Diet Prior to this Study: NPO Temperature Spikes Noted: No Respiratory Status: Room air History of Recent Intubation: No Behavior/Cognition: Alert;Cooperative;Requires cueing Oral Cavity Assessment: Dry;Other (comment) (mild amount of secretions) Oral Care Completed by SLP: Yes Oral Cavity - Dentition: Edentulous Self-Feeding Abilities: Needs assist;Needs set up;Able to feed self Patient Positioning: Upright in bed Baseline Vocal Quality: Not observed Volitional Cough: Cognitively unable to elicit Volitional Swallow: Unable to elicit    Oral/Motor/Sensory Function Overall Oral Motor/Sensory Function: Moderate impairment Facial ROM: Reduced left;Suspected CN VII (facial) dysfunction Facial Symmetry: Abnormal symmetry left;Suspected CN VII (facial) dysfunction Facial Strength: Reduced left;Suspected CN VII (facial) dysfunction Lingual Strength: Reduced   Ice Chips Ice chips: Impaired Oral Phase Impairments: Reduced labial seal;Reduced lingual movement/coordination   Thin Liquid Thin Liquid: Impaired Presentation: Cup;Spoon Oral Phase Impairments: Reduced labial seal;Reduced lingual movement/coordination Pharyngeal  Phase Impairments: Suspected delayed Swallow;Cough - Delayed    Nectar Thick     Honey  Thick     Puree Puree:  Impaired Presentation: Spoon Oral Phase Impairments: Reduced labial seal;Reduced lingual movement/coordination Oral Phase Functional Implications: Oral holding;Oral residue   Solid     Solid: Not tested      Norleen IVAR Blase, MA, CCC-SLP Speech Therapy

## 2024-01-01 NOTE — Assessment & Plan Note (Signed)
 Na 146 on admission due to poor intake during 24 hours prior to being found.  AKI ruled out.  Na resolved with fluids.

## 2024-01-01 NOTE — Hospital Course (Signed)
 78 y.o. F with RA on Arava, hyperthyroidism on methimazole, HTN, CKD IIIa baseline 1.1 who presented with large MCA stroke after being found down.

## 2024-01-01 NOTE — Evaluation (Signed)
 Speech Language Pathology Evaluation Patient Details Name: CERISE LIEBER MRN: [id number] DOB: Jun 02, 1946 Today's Date: 01/01/2024 Time: 1012-1022 SLP Time Calculation (min) (ACUTE ONLY): 10 min  Problem List:  Patient Active Problem List   Diagnosis Date Noted   Dehydration 12/31/2023   CVA (cerebral vascular accident) (HCC) 12/31/2023   Rhabdomyolysis 12/31/2023   Stroke (HCC) 12/31/2023   AKI (acute kidney injury) (HCC) 12/31/2023   A-fib (HCC) 12/31/2023   Hypernatremia 12/31/2023   Low grade fever 08/14/2023   Encounter for annual health examination 04/30/2023   Influenza vaccination declined 04/30/2023   Herpes zoster vaccination declined 04/30/2023   COVID-19 vaccination declined 04/30/2023   UTI (urinary tract infection) 04/30/2023   Irregular heart beat 04/23/2023   Benign hypertension with CKD (chronic kidney disease) stage III (HCC) 12/11/2022   Mixed hyperlipidemia 12/11/2022   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 No pertinent surgical history. HPI:  Patient is a 78 y.o. female with PMH: RA, HTN, a-fib not on anticoagulant. She works full time at [employer] as a Scientist, physiological. She was found down on 12/31/23 during a well check after family had not heard from her since Saturday (6/21). 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 MRI pending.   Assessment / Plan / Recommendation Clinical Impression  Patient presents with dysarthria and apraxia of speech as well as a mixed receptive-expressive aphasia with receptive language abilities>expressive language abilities. She was able to follow one step verbal commands but then perseverated on action and was unable to cease  without cues. She was oriented to self, telling SLP her last name as well as street name and house number of her address. Voice was low in intensity and patient exhibited initial phoneme repetitions when trying to respond to open-ended questions. She was able to tell SLP she worked at [employer] (confirmed by granddaughter). Yes/no head nods/shakes were only accurate for basic biographical information but not for time/place orientation. She does not attend to left side of visual field or left side of her body. She is significantly flaccid on left side of face and oral motor musculature. Further cognitive assessment planned for when she is able to communicate more accurately and consistently. She will benefit from continued skilled SLP intervention at current venue and at next venue of care as well.    SLP Assessment  SLP Recommendation/Assessment: Patient needs continued Speech Language Pathology Services SLP Visit Diagnosis: Aphasia (R47.01);Dysarthria and anarthria (R47.1);Apraxia (R48.2)     Assistance Recommended at Discharge  Frequent or constant Supervision/Assistance  Functional Status Assessment Patient has had a recent decline in their functional status and demonstrates the ability to make significant improvements in function in a reasonable and predictable amount of time.  Frequency and Duration min 2x/week  2 weeks      SLP Evaluation Cognition  Overall Cognitive Status: Difficult to assess Arousal/Alertness: Awake/alert Orientation Level: Oriented to person Behaviors: Perseveration       Comprehension  Auditory Comprehension Overall Auditory Comprehension: Impaired Commands: Impaired One Step Basic Commands: 75-100% accurate Interfering Components: Motor planning EffectiveTechniques: Extra processing time Visual Recognition/Discrimination Discrimination: Not tested Reading Comprehension Reading Status: Not tested    Expression Expression Primary Mode of  Expression:  Verbal Verbal Expression Overall Verbal Expression: Impaired Pragmatics: Impairment Impairments: Abnormal affect Interfering Components: Attention Effective Techniques: Other (Comment) (choice cues) Written Expression Written Expression: Not tested   Oral / Motor  Oral Motor/Sensory Function Overall Oral Motor/Sensory Function: Moderate impairment Facial ROM: Reduced left;Suspected CN VII (facial) dysfunction Facial Symmetry: Abnormal symmetry left;Suspected CN VII (facial) dysfunction Facial Strength: Reduced left;Suspected CN VII (facial) dysfunction Lingual Strength: Reduced Motor Speech Overall Motor Speech: Impaired Respiration: Within functional limits Phonation: Low vocal intensity Resonance: Within functional limits Intelligibility: Intelligibility reduced Word: 25-49% accurate Phrase: Not tested Sentence: Not tested Conversation: Not tested Motor Planning: Impaired Level of Impairment: Word Motor Speech Errors: Unaware;Consistent Interfering Components: Inadequate dentition Effective Techniques: Slow rate            Norleen IVAR Blase, MA, CCC-SLP Speech Therapy

## 2024-01-01 NOTE — Evaluation (Signed)
 Physical Therapy Evaluation Patient Details Name: Priscilla Thornton MRN: [id number] DOB: 01/11/1946 Today's Date: 01/01/2024  History of Present Illness  Pt is a 78 y/o F admitted on 12/31/23 after presenting a well check was called on family as pt had not been seen since Saturday (12/29/23). Pt was found down, found to be aphasic with L hemiparesis, R sided deviated gaze. 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 PMH: hyperthyroidism, RA, HTN, a-fib not on AC  Clinical Impression  Pt seen for PT evaluation with pt received in bed, cervical rotation to R (90 degrees). Pt responds to painful stimuli in L hand, attempting to verbalize ouch but at low volume. Pt tolerates PROM to neutral & to L cervical rotation but visual gaze continues to R. Pt requires total assist supine<>sit but tolerates sitting EOB ~3 minutes with min<>max assist. Pt with L lateral lean, does demonstrate some awareness, righting reactions as she reaches for bed rail with RUE to correct L lateral lean.  No active movement noted in LUE/LLE. No family/friends present to provide PLOF/home set up information. Pt shakes head yes to all questions (even when asked if she's 78 y/o, does she work, is her last name Claudene). Pt would benefit from ongoing PT treatment to address deficits noted & to increase independence & reduce caregiver burden.       If plan is discharge home, recommend the following: Two people to help with walking and/or transfers;Two people to help with bathing/dressing/bathroom   Can travel by private vehicle   No    Equipment Recommendations Hoyer lift;Wheelchair (measurements PT);Hospital bed;Wheelchair cushion (measurements PT)  Recommendations for Other Services  Rehab consult    Functional Status Assessment Patient has had a recent decline in their functional status and demonstrates the ability to make significant improvements in function in a reasonable and  predictable amount of time.     Precautions / Restrictions Precautions Precautions: Fall Precaution/Restrictions Comments: L hemi Restrictions Weight Bearing Restrictions Per Provider Order: No      Mobility  Bed Mobility Overal bed mobility: Needs Assistance Bed Mobility: Supine to Sit, Sit to Supine     Supine to sit: Total assist, Used rails, HOB elevated Sit to supine: Total assist, HOB elevated, Used rails        Transfers                        Ambulation/Gait                  Stairs            Wheelchair Mobility     Tilt Bed    Modified Rankin (Stroke Patients Only)       Balance Overall balance assessment: Needs assistance Sitting-balance support: Feet supported, Single extremity supported Sitting balance-Leahy Scale: Poor Sitting balance - Comments: Pt does demonstrate righting reactions as she reached for bed rail on R when losing balance to L at times. Postural control: Left lateral lean                                   Pertinent Vitals/Pain Pain Assessment Pain Assessment: Faces Pain Location: responds to painful stimuli on L hand, no behaviors demonstrating pain otherwise    Home Living Family/patient expects to be discharged to:: Private residence Living Arrangements: Alone Available Help at Discharge: Available PRN/intermittently;Family  Prior Function Prior Level of Function : Patient poor historian/Family not available             Mobility Comments: per chart, pt was working, supposed to meet friend the morning after this happened       Extremity/Trunk Assessment   Upper Extremity Assessment Upper Extremity Assessment: Difficult to assess due to impaired cognition (no active movement noted in LUE; pt does respond to painful stimuli)    Lower Extremity Assessment Lower Extremity Assessment: Difficult to assess due to impaired cognition (no active movement noted  in LLE, pt does respond to painful stimuli)    Cervical / Trunk Assessment Cervical / Trunk Assessment:  (head rotated to R, PT able to passively rotate head to neutral & onward to L ~70 degrees without issue, otherwise pt returns to cervical rotation to R ~90 degrees)  Communication   Communication Communication: No apparent difficulties Factors Affecting Communication: Difficulty expressing self    Cognition Arousal: Alert Behavior During Therapy: Flat affect   PT - Cognitive impairments: Difficult to assess Difficult to assess due to: Impaired communication                       Following commands: Impaired Following commands impaired: Follows one step commands with increased time, Follows one step commands inconsistently     Cueing Cueing Techniques: Verbal cues, Visual cues, Gestural cues, Tactile cues     General Comments      Exercises     Assessment/Plan    PT Assessment Patient needs continued PT services  PT Problem List Decreased strength;Decreased coordination;Decreased range of motion;Decreased cognition;Impaired sensation;Decreased activity tolerance;Decreased balance;Decreased mobility;Decreased safety awareness;Decreased knowledge of use of DME       PT Treatment Interventions DME instruction;Balance training;Gait training;Neuromuscular re-education;Stair training;Functional mobility training;Therapeutic activities;Therapeutic exercise;Patient/family education;Wheelchair mobility training;Cognitive remediation;Modalities;Manual techniques    PT Goals (Current goals can be found in the Care Plan section)  Acute Rehab PT Goals PT Goal Formulation: Patient unable to participate in goal setting Time For Goal Achievement: 01/15/24 Potential to Achieve Goals: Fair    Frequency Min 2X/week     Co-evaluation               AM-PAC PT 6 Clicks Mobility  Outcome Measure Help needed turning from your back to your side while in a flat bed  without using bedrails?: Total Help needed moving from lying on your back to sitting on the side of a flat bed without using bedrails?: Total Help needed moving to and from a bed to a chair (including a wheelchair)?: Total Help needed standing up from a chair using your arms (e.g., wheelchair or bedside chair)?: Total Help needed to walk in hospital room?: Total Help needed climbing 3-5 steps with a railing? : Total 6 Click Score: 6    End of Session   Activity Tolerance: Patient limited by fatigue;Patient tolerated treatment well Patient left: in bed;with bed alarm set;with call bell/phone within reach;with SCD's reapplied Nurse Communication: Mobility status PT Visit Diagnosis: Muscle weakness (generalized) (M62.81);Other abnormalities of gait and mobility (R26.89);Difficulty in walking, not elsewhere classified (R26.2);Hemiplegia and hemiparesis;Unsteadiness on feet (R26.81) Hemiplegia - Right/Left: Left Hemiplegia - caused by: Cerebral infarction    Time: 8592-8577 PT Time Calculation (min) (ACUTE ONLY): 15 min   Charges:   PT Evaluation $PT Eval Moderate Complexity: 1 Mod   PT General Charges $$ ACUTE PT VISIT: 1 Visit         Priscilla Thornton, PT, DPT  01/01/24, 2:35 PM   Priscilla Thornton 01/01/2024, 2:32 PM

## 2024-01-01 NOTE — Progress Notes (Addendum)
 STROKE TEAM PROGRESS NOTE   INTERIM HISTORY/SUBJECTIVE Found to be in afib on arrival - cardiology consulted  Family at the bedside. State patient is very secretive about her health status. Diagnosed with afib in October but did not follow up with cardiology and was not on a DOAC/anticoagulation MCA syndrome; PT/OT/ST pending. MRI done, awaiting echo  OBJECTIVE  CBC    Component Value Date/Time   WBC 4.9 12/31/2023 1506   RBC 4.31 12/31/2023 1506   HGB 12.6 12/31/2023 1521   HGB 11.4 04/23/2023 1104   HCT 37.0 12/31/2023 1521   HCT 36.4 04/23/2023 1104   PLT 312 12/31/2023 1506   PLT 341 04/23/2023 1104   MCV 88.2 12/31/2023 1506   MCV 87 04/23/2023 1104   MCH 26.9 12/31/2023 1506   MCHC 30.5 12/31/2023 1506   RDW 13.4 12/31/2023 1506   RDW 13.2 04/23/2023 1104   LYMPHSABS 0.8 12/31/2023 1506   LYMPHSABS 1.3 04/23/2023 1104   MONOABS 0.5 12/31/2023 1506   EOSABS 0.0 12/31/2023 1506   EOSABS 0.1 04/23/2023 1104   BASOSABS 0.0 12/31/2023 1506   BASOSABS 0.0 04/23/2023 1104    BMET    Component Value Date/Time   NA 144 12/31/2023 2318   NA 142 08/14/2023 1630   K 3.8 12/31/2023 2318   CL 110 12/31/2023 2318   CO2 22 12/31/2023 2318   GLUCOSE 107 (H) 12/31/2023 2318   BUN 31 (H) 12/31/2023 2318   BUN 22 08/14/2023 1630   CREATININE 1.16 (H) 12/31/2023 2318   CALCIUM  8.4 (L) 12/31/2023 2318   EGFR 46 (L) 08/14/2023 1630   GFRNONAA 49 (L) 12/31/2023 2318    IMAGING past 24 hours CT ANGIO HEAD NECK W WO CM Addendum Date: 12/31/2023 ADDENDUM REPORT: 12/31/2023 20:46 ADDENDUM: Reference for recommendation in CTA neck impression #4: J Am Coll Radiol. 2015 Feb;12(2): 143-50. Electronically Signed   By: Rockey Childs D.O.   On: 12/31/2023 20:46   Result Date: 12/31/2023 CLINICAL DATA:  Neuro deficit, acute, stroke suspected. Left-sided flaccid. Weakness. EXAM: CT ANGIOGRAPHY HEAD AND NECK WITH AND WITHOUT CONTRAST TECHNIQUE: Multidetector CT imaging of the head and neck was  performed using the standard protocol during bolus administration of intravenous contrast. Multiplanar CT image reconstructions and MIPs were obtained to evaluate the vascular anatomy. Carotid stenosis measurements (when applicable) are obtained utilizing NASCET criteria, using the distal internal carotid diameter as the denominator. RADIATION DOSE REDUCTION: This exam was performed according to the departmental dose-optimization program which includes automated exposure control, adjustment of the mA and/or kV according to patient size and/or use of iterative reconstruction technique. CONTRAST:  75mL OMNIPAQUE IOHEXOL 350 MG/ML SOLN COMPARISON:  Noncontrast head CT 12/22/2005. FINDINGS: CT HEAD FINDINGS Brain: Large acute right MCA territory infarct affecting the right frontal lobe/insula, temporal lobe, parietal lobe and occipital lobe, as well as the right basal ganglia region. Mild petechial hemorrhage questioned within portions of the infarction territory. Mild mass effect with partial effacement the right lateral ventricle. No midline shift. Small infarct within the left cerebellar hemisphere, new from the prior head CT of 12/22/2005 but otherwise age-indeterminate. No extra-axial fluid collection. No evidence of an intracranial mass. Vascular: No hyperdense vessel.  Atherosclerotic calcifications. Skull: No calvarial fracture or aggressive osseous lesion. Sinuses/Orbits: No orbital mass or acute orbital finding. 14 mm mucous retention cyst within the left maxillary sinus. Review of the MIP images confirms the above findings Non-contrast head CT impression #1 called by telephone at the time of interpretation on 12/31/2023  at 5:48 pm to provider DAVID YAO , who verbally acknowledged these results. CTA NECK FINDINGS Aortic arch: The proximal innominate, left common carotid and left subclavian arteries are excluded from the field of view. No hemodynamically significant stenosis within the visible innominate or  proximal subclavian arteries. Right carotid system: CCA and ICA patent within the neck without stenosis or significant atherosclerotic disease. Left carotid system: The very proximal common carotid artery is excluded from the field of view. The visible common carotid and internal carotid arteries are patent within the neck without stenosis or significant atherosclerotic disease. Vertebral arteries: Codominant and patent within the neck without stenosis or significant atherosclerotic disease. Skeleton: Cervical spondylosis. No acute fracture or aggressive osseous lesion. The patient is edentulous. Other neck: Multinodular thyroid  gland. The largest nodule is located within the inferior right thyroid  lobe (measuring 2 cm). Upper chest: No consolidation within the imaged lung apices. Review of the MIP images confirms the above findings CTA HEAD FINDINGS Anterior circulation: The intracranial internal carotid arteries are patent. Nonstenotic calcified plaque within both vessels. Severe, near occlusive stenosis of the right middle cerebral artery mid and distal M1 segment, and of an adjacent proximal right M2 branch. There is an eccentric filling defect at this site suggesting the presence of thrombus (for instance as seen on series 11, images 250 and 251). No left M2 proximal branch occlusion or high-grade proximal stenosis. The anterior cerebral arteries are patent. No intracranial aneurysm is identified. Posterior circulation: The intracranial vertebral arteries are patent. The basilar artery is patent. The posterior cerebral arteries are patent. Hypoplastic right P1 segment with sizable right posterior communicating artery. A left posterior communicating artery is also present. Venous sinuses: Within the limitations of contrast timing, no convincing thrombus. Anatomic variants: As described. Review of the MIP images confirms the above findings CTA head impression #1 called by telephone at the time of interpretation  on 12/31/2023 at 5:49 pm to provider DAVID YAO , who verbally acknowledged these results. IMPRESSION: Non-contrast head CT: 1. Large acute right MCA territory infarct with possible mild petechial hemorrhage. Mild mass effect with partial effacement of the right lateral ventricle. No midline shift. 2. Small infarct within the left cerebellar hemisphere, new from the prior head CT of 12/22/2005 but otherwise age-indeterminate. 3. 14 mm left maxillary sinus mucous retention cyst. CTA neck: 1. The very proximal innominate, left common carotid and left subclavian arteries are excluded from the field of view. Within this limitation, findings are as follows. 2. The visible common carotid and internal carotid arteries are patent within the neck without stenosis or significant atherosclerotic disease. 3. Vertebral arteries patent within the neck without stenosis or significant atherosclerotic disease. 4. Multinodular thyroid  gland (with nodules measuring up to 2 cm). A nonemergent thyroid  ultrasound is recommended for further evaluation. CTA head: 1. Severe near-occlusive stenosis of the right middle cerebral artery mid and distal M1 segment, and of an adjacent proximal right M2 branch. Eccentric filling defect at this site suggesting the presence of thrombus. 2. Non-stenotic atherosclerotic plaque within the intracranial internal carotid arteries. Electronically Signed: By: Rockey Childs D.O. On: 12/31/2023 18:11   DG Chest Port 1 View Result Date: 12/31/2023 CLINICAL DATA:  Altered mental status EXAM: PORTABLE CHEST 1 VIEW COMPARISON:  Chest x-ray 12/26/2011 FINDINGS: The heart size and mediastinal contours are within normal limits. Both lungs are clear. The visualized skeletal structures are unremarkable. IMPRESSION: No active disease. Electronically Signed   By: Greig Pique M.D.   On: 12/31/2023 15:48  Vitals:   01/01/24 0500 01/01/24 0515 01/01/24 0751 01/01/24 0755  BP: 130/80 131/85  (!) 143/74  Pulse: 77  65  89  Resp: (!) 22 (!) 22  (!) 23  Temp: 98.2 F (36.8 C)   98.1 F (36.7 C)  TempSrc:    Axillary  SpO2: 97% 99% 98% 98%     PHYSICAL EXAM General:  Alert, well-nourished, well-developed patient in no acute distress Psych:  Mood and affect appropriate for situation CV: Regular rate and rhythm on monitor Respiratory:  Regular, unlabored respirations on room air GI: Abdomen soft and nontender   NEURO:  Able to follow commands with right hand  Not much verbal output, wiggles toes to commands She is awake alert.  There is mild aphasia-expressive, severe dysarthria. Cranial nerves:  Pupils equal round reactive to light Forced right gaze deviation, left homonymous hemianopsia Left lower facial weakness, tongue to the left Motor examination with nearly flaccid left upper extremity with minimal movement to noxious stimulation.  Left lower extremity has some withdrawal to noxious simulation.  Right side is full strength. Sensory examination reveals diminished response to noxious stimulation on the left in comparison to the right with extinction on double simultaneous stimulation. Coordination examination with no dysmetria on the right, unable to check on the left.  Most Recent NIH 19    ASSESSMENT/PLAN  [name] is a 78 y.o. 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  CT head reveals a large right MCA territory infarct.  CT angiography reveals severe near occlusive stenosis of the right MCA M1 distal segment and adjacent proximal M2 branch.  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 Small infarct within the left  cerebellar hemisphere CTA head & neck  Severe near-occlusive stenosis of the right middle cerebral artery mid and distal M1 segment, and of an adjacent proximal right M2 branch. Eccentric filling defect at this site suggesting the presence of thrombus. MRI  Large acute right middle cerebral artery territory infarct. Petechial hemorrhage present within portions of the infarction territory in the right basal ganglia region. 2D Echo EF 55 to 60% LDL 85 HgbA1c 6.0 UDS neg VTE prophylaxis - SCDs No antithrombotic prior to admission, now on aspirin 300 mg suppository daily. Plan to start anticoagulation in 3-5 days given the HT.  Therapy recommendations:  Pending Disposition:  pending, palliative care consulted  Atrial fibrillation Hx of AFib found on EKG with PCP Pt did not go to cardiology appointment On home ASA 81, not on AC Now on ASA PR Consider AC with eliquis in 3-5 days   Hypertension Home meds:  Cozaar  Stable BP goal < 180/105 given HT Long term BP goal normotensive  Hyperlipidemia Home meds:  Atorvastatin  20mg   LDL 85, goal < 70 Increase to lipitor 40mg  once po access  Continue statin at discharge  Dysphagia Patient has post-stroke dysphagia SLP consulted Now NPO On IVF GOC discussion for tube feeding  Other Active Problems Hypothyroidism UA WBC 21-50, on Rocephin  Hospital day # 1  Patient seen and examined by NP/APP with MD. MD to update note as needed.   Jorene Last, DNP, FNP-BC [facility] Pager: [phone]  ATTENDING NOTE: I reviewed above note and agree with the assessment and plan. Pt was seen and examined.   Dr. Jonel is at the bedside. Pt daughter and granddaughter are at the bedside, pt drowsy sleepy and barely open eyes on voice. With eye opening, pt did not answer orientation questions, but did repeat I am here as requested with severe dysarthria. Able to follow limited commands on the right hand and wiggled right toes in  delayed fashion. Right forced gaze, not cross midline, not blinking to visual threat on the left. Left facial droop. Left hemiplegia. RUE 3/5 and RLE 2/5. Sensation, coordination and gait not tested.   MRI showed R large MCA infarct with petechial HT. Tele showed Afib but rate controlled. Pt not on AC at home. Now on ASA 300 PR due to NPO status. May consider DOAC in 3-5 days given HT, cardiology on board. Palliative care also on board for GOC discussion. On IVF, may consider tube feeding tomorrow.    For detailed assessment and plan, please refer to above as I have made changes wherever appropriate.   Ary Cummins, MD PhD Stroke Neurology 01/01/2024 5:42 PM  I discussed with Dr. Jonel. I spent extensive total face-to-face time with the patient daughter and granddaughters, reviewing test results, images and medication, and discussing the diagnosis, treatment plan and potential prognosis. This patient's care requiresreview of multiple databases, neurological assessment, discussion with family, other specialists and medical decision making of high complexity.      To contact Stroke Continuity provider, please refer to [email or web address]. After hours, contact General Neurology

## 2024-01-01 NOTE — Progress Notes (Signed)
  Progress Note   Patient: Priscilla Thornton FMW:[id number] DOB: Sep 15, 1945 DOA: 12/31/2023     1 DOS: the patient was seen and examined on 01/01/2024 at 11:14AM      Brief hospital course: 78 y.o. F with RA on Arava, hyperthyroidism on methimazole, HTN, CKD IIIa baseline 1.1 who presented with large MCA stroke after being found down.       Assessment and Plan: * Stroke [facility]) MRI brain showed large acute right middle cerebral artery territory infarct with petechial hemorrhage.   - Non-invasive angiography showed discrete right MCA occlusion - Echocardiogram pending - Carotid imaging complete, no high grade stenosis - Lipids ordered: LDL 85, will increase statin when Cortrak in palce - Aspirin 300 rectal daily - Evaluation for arrhythmia/atrial fibrillation: new onset, not previously on anticoagulation, discussed with Neuro, wait 3-5 days  - tPA not given because outside window - SLP recommend NPO - IV fluids - Place cortrak - PT eval ordered - Nonsmoker  - Consult Palliative Care  Hypernatremia Na 146 on admission due to poor intake during 24 hours prior to being found.  AKI ruled out.  Na resolved with fluids.  Persistent atrial fibrillation (HCC) See above.  Rate controlled.  CHA2DS2-Vasc 5 (age, HTN, stroke) - Follow echo - Hold Eliquis until cleared by Neurology, likely 3-5 days  UTI (urinary tract infection) UA with bacteria.  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 - Resume methimazole when cortrak placed  Hypomagnesemia - Supplement magnesium        Subjective: Patient nearly nonverbal.  Failed swallow eval today.   Densely paralyzed on the left.  No fever.  No respiratory symptoms, no nursing concerns.     Physical Exam: BP (!) (P) 147/104 (BP Location: Right Arm)   Pulse (P) 93   Temp (P) 98.6 F (37 C) (Axillary)   Resp (!) 22   Ht 5' 5 (1.651 m)   Wt 60.5 kg   SpO2 (P) 100%   BMI 22.20 kg/m   Elderly adult female, lying in bed, poorly responsive to sternal rub, oropharynx very dry Irregularly irregular, rate 90s, no murmurs, no peripheral edema Respiratory rate normal, lung sounds diminished but no rales or wheezes appreciated Abdomen soft, no tenderness palpation or guarding Pupils are pinpoint but equal.  She has right gaze preference.  She has left-sided facial droop and left-sided tongue deviation.  She withdraws from pain in the left, but has flaccid left side.  She is densely aphasic, unable to respond to any verbal questions on exam.     Data Reviewed: Discussed with neurology Creatinine 1.1, BUN elevated, sodium and potassium normal CBC unremarkable  MRI, report reviewed, summarized above    Family Communication: Daughter at the bedside, granddaughters at the bedside    Disposition: Status is: Inpatient         Author: Lonni SHAUNNA Dalton, MD 01/01/2024 4:05 PM  For on call review [email or web address].

## 2024-01-01 NOTE — Assessment & Plan Note (Signed)
 TSH normal here. - Resume methimazole when cortrak placed

## 2024-01-01 NOTE — TOC CAGE-AID Note (Signed)
 Transition of Care [facility]) - CAGE-AID Screening   Patient Details  Name: Priscilla Thornton MRN: [id number] Date of Birth: [birth year]  [facility] of [facility]) CM/SW Contact:    Tonantzin Mimnaugh E Rochester Serpe, LCSW Phone Number: 01/01/2024, 3:59 PM   Clinical Narrative:    CAGE-AID Screening: Substance Abuse Screening unable to be completed due to: : Patient unable to participate

## 2024-01-01 NOTE — Assessment & Plan Note (Signed)
 UA with bacteria.  Urine culture pending - Follow cultures - Continue Rocephin

## 2024-01-02 ENCOUNTER — Ambulatory Visit: Payer: Self-pay

## 2024-01-02 ENCOUNTER — Telehealth (HOSPITAL_COMMUNITY): Payer: Self-pay | Admitting: Pharmacy Technician

## 2024-01-02 ENCOUNTER — Ambulatory Visit: Payer: Medicare PPO | Admitting: Nurse Practitioner

## 2024-01-02 ENCOUNTER — Other Ambulatory Visit (HOSPITAL_COMMUNITY): Payer: Self-pay

## 2024-01-02 DIAGNOSIS — I4891 Unspecified atrial fibrillation: Secondary | ICD-10-CM | POA: Diagnosis not present

## 2024-01-02 DIAGNOSIS — I69391 Dysphagia following cerebral infarction: Secondary | ICD-10-CM | POA: Diagnosis not present

## 2024-01-02 DIAGNOSIS — Z7189 Other specified counseling: Secondary | ICD-10-CM

## 2024-01-02 DIAGNOSIS — I63411 Cerebral infarction due to embolism of right middle cerebral artery: Secondary | ICD-10-CM | POA: Diagnosis not present

## 2024-01-02 DIAGNOSIS — E785 Hyperlipidemia, unspecified: Secondary | ICD-10-CM | POA: Diagnosis not present

## 2024-01-02 DIAGNOSIS — Z515 Encounter for palliative care: Secondary | ICD-10-CM | POA: Diagnosis not present

## 2024-01-02 DIAGNOSIS — I63311 Cerebral infarction due to thrombosis of right middle cerebral artery: Secondary | ICD-10-CM | POA: Diagnosis not present

## 2024-01-02 LAB — T3: T3, Total: 119 ng/dL (ref 71–180)

## 2024-01-02 LAB — GLUCOSE, CAPILLARY
Glucose-Capillary: 103 mg/dL — ABNORMAL HIGH (ref 70–99)
Glucose-Capillary: 113 mg/dL — ABNORMAL HIGH (ref 70–99)
Glucose-Capillary: 122 mg/dL — ABNORMAL HIGH (ref 70–99)
Glucose-Capillary: 127 mg/dL — ABNORMAL HIGH (ref 70–99)

## 2024-01-02 LAB — BASIC METABOLIC PANEL WITH GFR
Anion gap: 10 (ref 5–15)
BUN: 30 mg/dL — ABNORMAL HIGH (ref 8–23)
CO2: 19 mmol/L — ABNORMAL LOW (ref 22–32)
Calcium: 8.7 mg/dL — ABNORMAL LOW (ref 8.9–10.3)
Chloride: 112 mmol/L — ABNORMAL HIGH (ref 98–111)
Creatinine, Ser: 0.9 mg/dL (ref 0.44–1.00)
GFR, Estimated: >60 mL/min (ref >60)
Glucose, Bld: 129 mg/dL — ABNORMAL HIGH (ref 70–99)
Potassium: 3.7 mmol/L (ref 3.5–5.1)
Sodium: 141 mmol/L (ref 135–145)

## 2024-01-02 LAB — CBC
HCT: 37.1 % (ref 36.0–46.0)
Hemoglobin: 11.2 g/dL — ABNORMAL LOW (ref 12.0–15.0)
MCH: 26.5 pg (ref 26.0–34.0)
MCHC: 30.2 g/dL (ref 30.0–36.0)
MCV: 87.7 fL (ref 80.0–100.0)
Platelets: 245 10*3/uL (ref 150–400)
RBC: 4.23 MIL/uL (ref 3.87–5.11)
RDW: 13.4 % (ref 11.5–15.5)
WBC: 5.8 10*3/uL (ref 4.0–10.5)
nRBC: 0 % (ref 0.0–0.2)

## 2024-01-02 LAB — MAGNESIUM: Magnesium: 2.3 mg/dL (ref 1.7–2.4)

## 2024-01-02 MED ORDER — FOLIC ACID 1 MG PO TABS
1.0000 mg | ORAL_TABLET | Freq: Every day | ORAL | Status: DC
  Administered 2024-01-03 – 2024-01-04 (×2): 1 mg via ORAL
  Filled 2024-01-02 (×2): qty 1

## 2024-01-02 MED ORDER — LEFLUNOMIDE 10 MG PO TABS
10.0000 mg | ORAL_TABLET | Freq: Every day | ORAL | Status: DC
  Administered 2024-01-03 – 2024-01-04 (×2): 10 mg via ORAL
  Filled 2024-01-02 (×2): qty 1

## 2024-01-02 MED ORDER — ATORVASTATIN CALCIUM 40 MG PO TABS
40.0000 mg | ORAL_TABLET | Freq: Every day | ORAL | Status: DC
  Administered 2024-01-02 – 2024-01-14 (×13): 40 mg
  Filled 2024-01-02 (×13): qty 1

## 2024-01-02 MED ORDER — ASPIRIN 325 MG PO TABS
325.0000 mg | ORAL_TABLET | Freq: Every day | ORAL | Status: DC
  Administered 2024-01-03 – 2024-01-04 (×2): 325 mg
  Filled 2024-01-02 (×5): qty 1

## 2024-01-02 MED ORDER — OSMOLITE 1.2 CAL PO LIQD
1000.0000 mL | ORAL | Status: DC
  Administered 2024-01-02 – 2024-01-13 (×8): 1000 mL
  Filled 2024-01-02: qty 1000

## 2024-01-02 MED ORDER — FREE WATER
75.0000 mL | Status: DC
  Administered 2024-01-02 – 2024-01-14 (×67): 75 mL

## 2024-01-02 MED ORDER — METHIMAZOLE 5 MG PO TABS
5.0000 mg | ORAL_TABLET | Freq: Every day | ORAL | Status: DC
  Administered 2024-01-03 – 2024-01-04 (×2): 5 mg via ORAL
  Filled 2024-01-02 (×2): qty 1

## 2024-01-02 MED ORDER — ADULT MULTIVITAMIN W/MINERALS CH
1.0000 | ORAL_TABLET | Freq: Every day | ORAL | Status: DC
  Administered 2024-01-02 – 2024-01-14 (×13): 1
  Filled 2024-01-02 (×13): qty 1

## 2024-01-02 NOTE — Progress Notes (Signed)
 STROKE TEAM PROGRESS NOTE   INTERIM HISTORY/SUBJECTIVE Daughter is at bedside.  Patient lying bed, more awake alert, eyes open, able to tell me her name and told me age 78 instead of 75, orientated to place but not answer other questions, did not name repeat, able to follow simple commands in the delayed fashion.  Still has left hemiplegia, right gaze.  PT and OT recommend SNF.  OBJECTIVE  CBC    Component Value Date/Time   WBC 5.8 01/02/2024 0439   RBC 4.23 01/02/2024 0439   HGB 11.2 (L) 01/02/2024 0439   HGB 11.4 04/23/2023 1104   HCT 37.1 01/02/2024 0439   HCT 36.4 04/23/2023 1104   PLT 245 01/02/2024 0439   PLT 341 04/23/2023 1104   MCV 87.7 01/02/2024 0439   MCV 87 04/23/2023 1104   MCH 26.5 01/02/2024 0439   MCHC 30.2 01/02/2024 0439   RDW 13.4 01/02/2024 0439   RDW 13.2 04/23/2023 1104   LYMPHSABS 0.8 12/31/2023 1506   LYMPHSABS 1.3 04/23/2023 1104   MONOABS 0.5 12/31/2023 1506   EOSABS 0.0 12/31/2023 1506   EOSABS 0.1 04/23/2023 1104   BASOSABS 0.0 12/31/2023 1506   BASOSABS 0.0 04/23/2023 1104    BMET    Component Value Date/Time   NA 141 01/02/2024 0439   NA 142 08/14/2023 1630   K 3.7 01/02/2024 0439   CL 112 (H) 01/02/2024 0439   CO2 19 (L) 01/02/2024 0439   GLUCOSE 129 (H) 01/02/2024 0439   BUN 30 (H) 01/02/2024 0439   BUN 22 08/14/2023 1630   CREATININE 0.90 01/02/2024 0439   CALCIUM  8.7 (L) 01/02/2024 0439   EGFR 46 (L) 08/14/2023 1630   GFRNONAA >60 01/02/2024 0439    IMAGING past 24 hours No results found.   Vitals:   01/02/24 0443 01/02/24 0750 01/02/24 1119 01/02/24 1500  BP: (!) 147/115 135/85 136/87 132/83  Pulse: (!) 106 (!) 103 82 75  Resp: 16 20 14    Temp: 98.1 F (36.7 C) 98.5 F (36.9 C) 98 F (36.7 C) 97.6 F (36.4 C)  TempSrc: Oral Axillary Oral Oral  SpO2: 98% 98% 98% 99%  Weight:      Height:         PHYSICAL EXAM General: Awake, alert, well-developed patient in no acute distress CV: irregularly irregular heart  rate and rhythm. Respiratory:  Regular, unlabored respirations on room air GI: Abdomen soft and nontender NEURO:  pt awake, alert, eyes open, able to tell me her name and told me age 20 instead of 25, orientated to place but not answer other questions, did not name repeat, able to follow simple commands in the delayed fashion, moderate to severe dysarthria. Right forced gaze, not cross midline, not blinking to visual threat on the left. Left facial droop. Left hemiplegia. RUE at least 3/5 and RLE at least 2/5. Sensation, coordination not corporative and gait not tested    ASSESSMENT/PLAN  Priscilla Thornton is a 78 y.o. 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  CT head reveals a large right MCA territory infarct.  CT angiography reveals severe near occlusive stenosis of the right MCA M1 distal segment and adjacent proximal M2 branch.  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 Small infarct within the left cerebellar hemisphere CTA head & neck  Severe near-occlusive stenosis of the right middle cerebral artery mid and distal M1 segment, and of an adjacent proximal right M2 branch. Eccentric filling defect at this site suggesting the presence of thrombus. MRI  Large acute right middle cerebral artery territory infarct. Petechial hemorrhage present within portions of the infarction territory in the right basal ganglia region. 2D Echo EF 55 to 60% LDL 85 HgbA1c 6.0 UDS neg VTE prophylaxis - SCDs No antithrombotic prior to admission, now on aspirin 325. Plan to start anticoagulation in 3-5 days given the HT.  Therapy recommendations:  SNF Disposition:  pending, family lives in [place], searching options for placement in [place]  Atrial fibrillation Hx of AFib found on EKG with PCP Pt did not go to cardiology appointment On home ASA 81, not on AC Now on ASA 325 Consider AC with eliquis in 3-5 days   Hypertension Home meds:  Cozaar  Stable BP goal < 180/105 given HT Long term BP goal normotensive  Hyperlipidemia Home meds:  Atorvastatin  20mg   LDL 85, goal < 70 Increase to lipitor 40mg  once po access  Continue statin at discharge  Dysphagia Patient has post-stroke dysphagia SLP consulted Now NPO On tube feeding  Other Active Problems Hypothyroidism UA WBC 21-50, on Rocephin  Hospital day # 2  Ary Cummins, MD PhD Stroke Neurology 01/02/2024 4:46 PM       To contact Stroke Continuity provider, please refer to [email or web address]. After hours, contact General Neurology

## 2024-01-02 NOTE — Evaluation (Signed)
 Occupational Therapy Evaluation Patient Details Name: Priscilla Thornton MRN: [id number] DOB: April 21, 1946 Today's Date: 01/02/2024   History of Present Illness   Pt is a 78 y/o F admitted on 12/31/23 after presenting a well check was called on family as pt had not been seen since Saturday (12/29/23). Pt was found down, found to be aphasic with L hemiparesis, R sided deviated gaze. 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 PMH: hyperthyroidism, RA, HTN, a-fib not on [facility]     Clinical Impressions Pt admitted for above, per pt family they report that she was independent PTA and working as a Diplomatic Services operational officer. Pt currently presenting with L hemiplegia and inattention, impaired balance, cog, and activity tolerance. Pt able to follow 75% of commands but none regarding use of L side, able to stand today with mod A but LLE not supporting pt. She was able to perform ADLs with Total A to min A, able to use RUE to functionally engage in ADLs. OT to continue following pt acutely to address listed deficits and help transition to next level of care. Patient will benefit from continued inpatient follow up therapy, <3 hours/day      If plan is discharge home, recommend the following:   A lot of help with walking and/or transfers;A lot of help with bathing/dressing/bathroom;Assistance with cooking/housework;Direct supervision/assist for financial management;Supervision due to cognitive status;Help with stairs or ramp for entrance;Assist for transportation;Direct supervision/assist for medications management     Functional Status Assessment   Patient has had a recent decline in their functional status and demonstrates the ability to make significant improvements in function in a reasonable and predictable amount of time.     Equipment Recommendations   BSC/3in1;Wheelchair (measurements OT);Wheelchair cushion (measurements OT)     Recommendations for Other Services    Rehab consult     Precautions/Restrictions   Precautions Precautions: Fall Recall of Precautions/Restrictions: Impaired Precaution/Restrictions Comments: L hemi Restrictions Weight Bearing Restrictions Per Provider Order: No     Mobility Bed Mobility Overal bed mobility: Needs Assistance Bed Mobility: Supine to Sit, Sit to Supine     Supine to sit: Mod assist, HOB elevated, Used rails Sit to supine: Mod assist, Used rails   General bed mobility comments: Pt using rail to assist with pulling in bed, initiating return to supine with lateral lean to R side without OT direction.    Transfers Overall transfer level: Needs assistance Equipment used: 1 person hand held assist Transfers: Sit to/from Stand Sit to Stand: Mod assist           General transfer comment: STSx2 from EOB, used chair on Pt R side for UE support. LLE hyperextending with slow buckle      Balance Overall balance assessment: Needs assistance Sitting-balance support: Feet supported, Single extremity supported Sitting balance-Leahy Scale: Poor Sitting balance - Comments: min A sitting balance.   Standing balance support: Single extremity supported Standing balance-Leahy Scale: Poor Standing balance comment: reliant on ext support.                           ADL either performed or assessed with clinical judgement   ADL Overall ADL's : Needs assistance/impaired Eating/Feeding: NPO   Grooming: Sitting;Wash/dry face;Minimal assistance   Upper Body Bathing: Sitting;Moderate assistance   Lower Body Bathing: Maximal assistance;Sitting/lateral leans   Upper Body Dressing : Sitting;Maximal assistance   Lower Body Dressing: Total assistance;Sitting/lateral leans  Toilet Transfer Details (indicate cue type and reason): deferred transfer training.         Functional mobility during ADLs: Moderate assistance (STS from EOB) General ADL Comments: Educated family on increasing  awarenesss to L side with visual stimuli.     Vision   Vision Assessment?: Vision impaired- to be further tested in functional context Additional Comments: Difficult to assess, impaired cognition. Identified # of therapists fingers accurately but unable to occlude vision. R gaze preference but tracks to midline, able to track more to L side with cervical rotation assist and visual stimuli.     Perception Perception: Impaired Preception Impairment Details: Inattention/Neglect Perception-Other Comments: L inattention.   Praxis Praxis: Impaired Praxis Impairment Details: Motor planning     Pertinent Vitals/Pain Pain Assessment Pain Assessment: Faces Faces Pain Scale: No hurt     Extremity/Trunk Assessment Upper Extremity Assessment Upper Extremity Assessment: LUE deficits/detail;Right hand dominant (R hand very strong grip, ROM WFL to touch face.) LUE Deficits / Details: Strong L inattention, No AROM of any musculaturature. Flaccid, no sublux noted   Lower Extremity Assessment Lower Extremity Assessment: Difficult to assess due to impaired cognition (no active movement noted in LLE, pt does respond to painful stimuli)       Communication Communication Communication: No apparent difficulties Factors Affecting Communication: Difficulty expressing self   Cognition Arousal: Alert Behavior During Therapy: Flat affect Cognition: Difficult to assess, Cognition impaired Difficult to assess due to: Impaired communication   Awareness: Online awareness impaired, Intellectual awareness intact   Attention impairment (select first level of impairment): Focused attention, Sustained attention Executive functioning impairment (select all impairments): Problem solving, Organization, Reasoning OT - Cognition Comments: Pt following 75% of single step commands during session, more troubles with commands pertaining to L side. Strong L inattention.                 Following commands:  Impaired Following commands impaired: Follows one step commands with increased time, Follows one step commands inconsistently     Cueing  General Comments   Cueing Techniques: Verbal cues;Visual cues;Gestural cues;Tactile cues  Family present and supportive during session.   Exercises     Shoulder Instructions      Home Living Family/patient expects to be discharged to:: Private residence Living Arrangements: Alone Available Help at Discharge: Available PRN/intermittently;Family                                Lives With: Alone    Prior Functioning/Environment               Mobility Comments: per chart, pt was working, supposed to meet friend the morning after this happened ADLs Comments: ind per family report. Works as a Designer, industrial/product Problem List: Impaired balance (sitting and/or standing);Decreased activity tolerance;Decreased cognition;Impaired UE functional use;Decreased coordination;Decreased range of motion;Decreased strength;Impaired vision/perception   OT Treatment/Interventions: Therapeutic exercise;Patient/family education;Self-care/ADL training;Balance training;Therapeutic activities;DME and/or AE instruction;Neuromuscular education;Visual/perceptual remediation/compensation      OT Goals(Current goals can be found in the care plan section)   Acute Rehab OT Goals Patient Stated Goal: To get back to normal functioning OT Goal Formulation: With family Time For Goal Achievement: 01/16/24 Potential to Achieve Goals: Fair ADL Goals Pt Will Perform Upper Body Bathing: sitting;with min assist Pt Will Perform Upper Body Dressing: sitting;with min assist Pt Will Transfer to Toilet: with min assist;bedside commode;stand pivot transfer Additional ADL Goal #1: Pt will identify  3/3 objects on L side to demonstrate improving awareness to L side.   OT Frequency:  Min 2X/week    Co-evaluation              AM-PAC OT 6 Clicks Daily Activity      Outcome Measure Help from another person eating meals?: Total (NPO) Help from another person taking care of personal grooming?: A Little Help from another person toileting, which includes using toliet, bedpan, or urinal?: A Lot Help from another person bathing (including washing, rinsing, drying)?: A Lot Help from another person to put on and taking off regular upper body clothing?: A Lot Help from another person to put on and taking off regular lower body clothing?: Total 6 Click Score: 11   End of Session Equipment Utilized During Treatment: Gait belt Nurse Communication: Mobility status  Activity Tolerance: Patient tolerated treatment well Patient left: in bed;with family/visitor present;with call bell/phone within reach;with bed alarm set  OT Visit Diagnosis: Unsteadiness on feet (R26.81);Other abnormalities of gait and mobility (R26.89);Hemiplegia and hemiparesis;Muscle weakness (generalized) (M62.81);Other symptoms and signs involving cognitive function Hemiplegia - Right/Left: Left Hemiplegia - dominant/non-dominant: Non-Dominant Hemiplegia - caused by: Cerebral infarction                Time: 1140-1215 OT Time Calculation (min): 35 min Charges:  OT General Charges $OT Visit: 1 Visit OT Evaluation $OT Eval High Complexity: 1 High OT Treatments $Therapeutic Activity: 8-22 mins  01/02/2024  AB, OTR/L  Acute Rehabilitation Services  Office: [phone]   Curtistine JONETTA Das 01/02/2024, 5:34 PM

## 2024-01-02 NOTE — TOC Initial Note (Signed)
 Transition of Care [facility]) - Initial/Assessment Note    Patient Details  Name: Priscilla Thornton MRN: [id number] Date of Birth: [birth year]  Transition of Care [facility]) CM/SW Contact:    [name], LCSW Phone Number: 01/02/2024, 2:56 PM  Clinical Narrative:     CSW met with patient's daughter, Priscilla Thornton, per request. Daughter requesting information on a possible hospital transfer, said she had already discussed with MD on how that might not happen. CSW also explained that it would likely not be able to happen, but that when patient is ready for discharge, SNF can be located in the [place] area. Daughter to research options for SNF in the [place] area and provide CSW with a list of preferences. CSW answered questions and provided support, daughter appreciative. CSW to follow.              Expected Discharge Plan: Skilled Nursing Facility Barriers to Discharge: Insurance Authorization, Continued Medical Work up   Patient Goals and CMS Choice Patient states their goals for this hospitalization and ongoing recovery are:: patient unable to participate in goal setting, not oriented CMS Medicare.gov Compare Post Acute Care list provided to:: Patient Represenative (must comment) Choice offered to / list presented to : Adult Children [place] ownership interest in [facility].provided to:: Adult Children    Expected Discharge Plan and Services     Post Acute Care Choice: Skilled Nursing Facility Living arrangements for the past 2 months: Single Family Home                                      Prior Living Arrangements/Services Living arrangements for the past 2 months: Single Family Home Lives with:: Self Patient language and need for interpreter reviewed:: No Do you feel safe going back to the place where you live?: Yes      Need for Family Participation in Patient Care: Yes (Comment) Care giver support system in place?: No (comment)   Criminal Activity/Legal  Involvement Pertinent to Current Situation/Hospitalization: No - Comment as needed  Activities of Daily Living      Permission Sought/Granted Permission sought to share information with : Facility Medical sales representative, Family Supports Permission granted to share information with : Yes, Verbal Permission Granted  Share Information with NAME: Priscilla Thornton  Permission granted to share info w AGENCY: SNF  Permission granted to share info w Relationship: Daughter     Emotional Assessment Appearance:: Appears stated age Attitude/Demeanor/Rapport: Unable to Assess Affect (typically observed): Unable to Assess Orientation: : Oriented to Self, Oriented to Place Alcohol / Substance Use: Not Applicable Psych Involvement: No (comment)  Admission diagnosis:  Stroke [facility]) [I63.9] Cerebrovascular accident (CVA), unspecified mechanism (HCC) [I63.9] Patient Active Problem List   Diagnosis Date Noted   Stroke (HCC) 12/31/2023   Persistent atrial fibrillation (HCC) 12/31/2023   Hypernatremia 12/31/2023   Low grade fever 08/14/2023   Encounter for annual health examination 04/30/2023   Influenza vaccination declined 04/30/2023   Herpes zoster vaccination declined 04/30/2023   COVID-19 vaccination declined 04/30/2023   UTI (urinary tract infection) 04/30/2023   Irregular heart beat 04/23/2023   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) 12/11/2022   Mixed hyperlipidemia 12/11/2022   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 (HCC) 12/11/2022   Hyperthyroidism 06/03/2018   Arthritis 06/03/2018   PCP:  Georgina Speaks, FNP Pharmacy:   [facility] [phone] - [place],  [place] - [address] AT [facility] [address] [place] [address] Phone: [phone] Fax: [phone]     Social Drivers of Health (SDOH) Social History: SDOH Screenings   Food Insecurity: Patient Unable To Answer (01/02/2024)   Housing: Unknown (01/02/2024)  Transportation Needs: Patient Unable To Answer (01/02/2024)  Utilities: Patient Unable To Answer (01/02/2024)  Depression (PHQ2-9): Low Risk  (08/14/2023)  Financial Resource Strain: Low Risk  (01/25/2022)  Physical Activity: Inactive (01/25/2022)  Social Connections: Patient Unable To Answer (01/02/2024)  Stress: No Stress Concern Present (01/25/2022)  Tobacco Use: Medium Risk (01/01/2024)   SDOH Interventions:     Readmission Risk Interventions     No data to display

## 2024-01-02 NOTE — Plan of Care (Signed)
  Problem: Education: Goal: Knowledge of disease or condition will improve Outcome: Progressing   Problem: Ischemic Stroke/TIA Tissue Perfusion: Goal: Complications of ischemic stroke/TIA will be minimized Outcome: Progressing   Problem: Coping: Goal: Will verbalize positive feelings about self Outcome: Progressing   Problem: Self-Care: Goal: Ability to participate in self-care as condition permits will improve Outcome: Progressing   Problem: Self-Care: Goal: Ability to communicate needs accurately will improve Outcome: Progressing   Problem: Nutrition: Goal: Risk of aspiration will decrease Outcome: Progressing   Problem: Nutrition: Goal: Dietary intake will improve Outcome: Progressing   Problem: Education: Goal: Knowledge of General Education information will improve Description: Including pain rating scale, medication(s)/side effects and non-pharmacologic comfort measures Outcome: Progressing   Problem: Activity: Goal: Risk for activity intolerance will decrease Outcome: Progressing   Problem: Safety: Goal: Ability to remain free from injury will improve Outcome: Progressing   Problem: Skin Integrity: Goal: Risk for impaired skin integrity will decrease Outcome: Progressing

## 2024-01-02 NOTE — Procedures (Signed)
 Cortrak  Person Inserting Tube:  Denece Shearer, Kerstin Peeling, RD Tube Type:  Cortrak - 43 inches Tube Size:  10 Tube Location:  Left nare Initial Placement:  Stomach Secured by: Bridle Technique Used to Measure Tube Placement:  Marking at nare/corner of mouth Cortrak Secured At:  67 cm   Cortrak Tube Team Note:  Consult received to place a Cortrak feeding tube.   No x-ray is required. RN may begin using tube.   If the tube becomes dislodged please keep the tube and contact the Cortrak team at [email or web address] for replacement.  If after hours and replacement cannot be delayed, place a NG tube and confirm placement with an abdominal x-ray.   Frederik Jansky, RD Registered Dietitian  See Amion for more information

## 2024-01-02 NOTE — Progress Notes (Signed)
  Progress Note   Patient: Priscilla Thornton FMW:[id number] DOB: Nov 30, 1945 DOA: 12/31/2023     2 DOS: the patient was seen and examined on 01/02/2024 at 12:15PM      Brief hospital course: 78 y.o. F with RA on Arava, hyperthyroidism on methimazole, HTN, CKD IIIa baseline 1.1 who presented with large MCA stroke after being found down.       Assessment and Plan: * Stroke [facility]) MRI brain showed large acute right middle cerebral artery territory infarct with petechial hemorrhage.   - Non-invasive angiography showed discrete right MCA occlusion - Echocardiogram unremarkable - Carotid imaging complete, no high grade stenosis - Lipids ordered: LDL 85, atorvastatin  doubled - Aspirin 325 daily, plan for anticoaglatuion in 2-4 days - Evaluation for arrhythmia/atrial fibrillation: new onset, not previously on anticoagulation, discussed with Neuro  - tPA not given because outside window - Remains NPO - Cotrak in place, start tube feeds and free water - PT eval ordered - Nonsmoker  - Consult Palliative Care    Hypernatremia Resolved  Persistent atrial fibrillation (HCC) See above.  Rate controlled.  CHA2DS2-Vasc 5 (age, HTN, stroke) - Hold Eliquis until cleared by Neurology, likely 2-4 days  UTI (urinary tract infection) UA with bacteria.  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 - Resume methimazole          Subjective: Patient has made some responses to the family today.  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  She is still very weak and unable to swallow.  No fever, no nursing concerns.     Physical Exam: BP 132/83 (BP Location: Right Arm)   Pulse 75    Temp 97.6 F (36.4 C) (Oral)   Resp 14   Ht 5' 5 (1.651 m)   Wt 60.5 kg   SpO2 99%   BMI 22.20 kg/m   Elderly female, lying in bed, weak and poorly responsive Irregularly irregular, rate controlled, no murmurs, slight puffiness in the extremities Respiratory rate normal, lung sounds diminished Abdomen soft, no grimace to palpation Right gaze preference, makes eye contact, answers questions slowly, incomplete answers, still with left facial droop, left side flaccid.  Data Reviewed: Basic metabolic panel unremarkable, hypernatremia resolved, creatinine improved CBC shows mild anemia     Family Communication: Daughter and granddaughters at the bedside    Disposition: Status is: Inpatient         Author: Lonni SHAUNNA Dalton, MD 01/02/2024 6:30 PM  For on call review [email or web address].

## 2024-01-02 NOTE — Progress Notes (Signed)
 Initial Nutrition Assessment  DOCUMENTATION CODES:  Not applicable  INTERVENTION:  Initiate tube feeding via cortrak: Osmolite 1.2 at 55 ml/h (1320 ml per day) Start at 25 and advance by 10 q 8 hours Free water flush 75mL q4h Provides 1584 kcal, 73 gm protein, 1082 ml free water daily (TF+flush = free water) Multivitamin with minerals daily  NUTRITION DIAGNOSIS:  Inadequate oral intake related to inability to eat as evidenced by NPO status.  GOAL:  Patient will meet greater than or equal to 90% of their needs  MONITOR:  TF tolerance, Labs, Weight trends  REASON FOR ASSESSMENT:  Consult Enteral/tube feeding initiation and management  ASSESSMENT:  Pt with hx of HTN and hypothyroidism presented to ED after she was found with AMS at home. Imaging in ED showed a large right MCA infarct with petechial hemorrhage.  6/23 - admitted 6/24 - SLP BSE, NPO recommended 6/25 - cortrak placed  Pt resting in bed at the time of assessment. Awake and alert, but still with left sided weakness making speech hard to understand. Daughter at bedside able to provide a nutrition hx.   Reports that pt does not have a very big appetite. States that she tends to snack on small items like candy bars during the day but then will eat a larger meal (meat and sides such as meatloaf) for dinner. Drinks a variety of fluids. Pt reports that she drinks 1 ensure plus each day.    On exam, pt does have muscle deficits, but handgrip is very strong and daughter reports that pt is completely independent at baseline and still works. Likely a combination of age and inadequate baseline protein intake.   Cortrak placed today to provide nutrition and access for medications. Discussed nutrition plan with RN and daughter at bedside. All questions answered  Admit / Current weight: 60.5 kg   Nutritionally Relevant Medications: Continuous Infusions:  cefTRIAXone (ROCEPHIN)  IV 1 g (01/02/24 0002)   dextrose 5 % and  0.45 % NaCl with KCl 20 mEq/L 100 mL/hr at 01/02/24 0234   Labs Reviewed: Chloride 112 BUN 30 HgbA1c 6.0%  NUTRITION - FOCUSED PHYSICAL EXAM: Flowsheet Row Most Recent Value  Orbital Region Mild depletion  Upper Arm Region No depletion  Thoracic and Lumbar Region No depletion  Buccal Region No depletion  Temple Region Mild depletion  Clavicle Bone Region Mild depletion  Clavicle and Acromion Bone Region Mild depletion  Scapular Bone Region No depletion  Dorsal Hand No depletion  Patellar Region Mild depletion  Anterior Thigh Region Mild depletion  Posterior Calf Region Mild depletion  Edema (RD Assessment) None  Hair Reviewed  Eyes Reviewed  Mouth Reviewed  Skin Reviewed  Nails Reviewed    Diet Order:   Diet Order             Diet NPO time specified  Diet effective now                   EDUCATION NEEDS:  Education needs have been addressed  Skin:  Skin Assessment: Reviewed RN Assessment  Last BM:  unsure  Height:  Ht Readings from Last 1 Encounters:  01/01/24 5' 5 (1.651 m)    Weight:  Wt Readings from Last 1 Encounters:  01/01/24 60.5 kg    Ideal Body Weight:  56.8 kg  BMI:  Body mass index is 22.2 kg/m.  Estimated Nutritional Needs:  Kcal:  1400-1600 kcal/d Protein:  70-85g/d Fluid:  >1.5L/d    Priscilla Thornton,  RD, LDN, CNSC Registered Dietitian II Please reach out via secure chat

## 2024-01-02 NOTE — Consult Note (Signed)
 Palliative Medicine Inpatient Consult Note  Consulting Provider: Dr. Jonel  Reason for consult:   Palliative Care Consult Services Palliative Medicine Consult  Reason for Consult? Goals of care   01/02/2024  HPI:  Per intake H&P --> 78 y.o. F with RA on Arava, hyperthyroidism on methimazole, HTN, CKD IIIa baseline 1.1, new afib - presented with large MCA stroke after being found down. Palliative care requested to support additional goals of care conversations.   Clinical Assessment/Goals of Care:  *Please note that this is a verbal dictation therefore any spelling or grammatical errors are due to the Dragon Medical One system interpretation.  I have reviewed medical records including EPIC notes, labs and imaging, received report from bedside RN, assessed the patient who is lying in bed with core track in place somnolent in appearance though does open eyes from time to time.   I met with patient's 2 granddaughters and spoke to her daughter, Priscilla Thornton over the phone to further discuss diagnosis prognosis, GOC, EOL wishes, disposition and options.   I introduced Palliative Medicine as specialized medical care for people living with serious illness. It focuses on providing relief from the symptoms and stress of a serious illness. The goal is to improve quality of life for both the patient and the family.  Medical History Review and Understanding:  A review of any's past medical history inclusive of hyper thyroidism, rheumatoid arthritis, stage III chronic kidney disease, hypertension, and atrial fibrillation diagnosed in October was completed  Social History:  Priscilla Thornton is from [place],  .  She is not married.  She has 1 daughter and 2 granddaughters.  She is the middle child and has 2 living sisters.  She would love to work and was working up until this recent event.  She is identified as a strong woman who loves to be the life of the party.  She is a woman of the [employer].  Functional and Nutritional State:  Prior to hospitalization any was fully functional able to attend to all her daily needs and was still actively working. Priscilla Thornton's appetite had been poor for quite some time her daughter attributes this to her hyperparathyroidism.  She apparently ate the same meal daily which was Werther's original candy, mints, a piece of meat loaf, cabbage, sweet potato and that was her nutritional intake on a daily basis.  Advance Directives:  A detailed discussion was had today regarding advanced directives.  Priscilla Thornton's daughter, Priscilla Thornton is her Runner, broadcasting/film/video.  Code Status:  Concepts specific to code status, artifical feeding and hydration, continued IV antibiotics and rehospitalization was had.  The difference between a aggressive medical intervention path  and a palliative comfort care path for this patient at this time was had.   Encouraged patient/family to consider DNR/DNI status understanding evidenced based poor outcomes in similar hospitalized patient, as the cause of arrest is likely associated with advanced chronic/terminal illness rather than an easily reversible acute cardio-pulmonary event. I explained that DNR/DNI does not change the medical plan and it only comes into effect after a person has arrested (died).  It is a protective measure to keep us  from harming the patient in their last moments of life.   Patient's daughter states understanding of what cardiopulmonary resuscitation is and can do.  While she knows her mother would like it she also identifies her mother would want it so that family could say goodbye to her.  As a result of this Priscilla Thornton would like to keep her full  code full scope of care.  Discussion:  Discussions were held in the setting of patient's recent large right MCA infarction.  Patient's daughter understands that this is a very significant stroke resulting in left-sided paralysis/debility for her mother.  She shares the  neurologist who she spoke to initially was very straightforward and stating recovery would be a long and hard journey.  They shared that she will require 24/7 care which would exceed the level of care family could provide in the home environment.  They shared what potential function recovery would look like from the perspective of patient regaining independence.  We discussed the risks associated with patient's dysphagia and likely aspirational events that could occur.  I shared very honestly my concerns that this will evolve into potentially being the reality moving into the future and what to look for associated with recurring aspirational events.  We discussed patient's dysphagia as well and the affected will have on her nutritional intake which was already identified to be poor.  We reviewed the use of a core track for short-term artificial nutrition and potential consideration of longer-term options thereafter.  At this point in time patient's family would like to allow time for outcomes and are very hopeful that she can regain some degree of autonomy as patient was a fiercely independent woman.  They do feel that she values her family would want to remain here.  Patient's family asked if she could be transferred to [facility] hospital in [place] as they are all close to it and it is difficult to come to [place] on a daily basis.  I shared that I do not have an answer to that but I will bring it up to the primary medical team and social work team.  Discussed the importance of continued conversation with family and their  medical providers regarding overall plan of care and treatment options, ensuring decisions are within the context of the patients values and GOCs.  Decision Maker: Priscilla Thornton,Priscilla Thornton (Daughter): [phone] (Mobile)   SUMMARY OF RECOMMENDATIONS   Full code/full scope of care  Open and honest conversations held in the setting of patient's large right MCA stroke and what to  anticipate from a functional nutritional recovery  Discussed ongoing risk of dysphagia and what this could evolve into in the short and long-term  Patient's family remain hopeful for improvement  Appreciate medical team and MSW talk about options for transfer per family request  Ongoing palliative care support  Code Status/Advance Care Planning: FULL CODE  Palliative Prophylaxis:  Aspiration, Bowel Regimen, Delirium Protocol, Frequent Pain Assessment, Oral Care, Palliative Wound Care, and Turn Reposition  Additional Recommendations (Limitations, Scope, Preferences): Continue current care  Psycho-social/Spiritual:  Desire for further Chaplaincy support: Patient has [facility] Additional Recommendations: Education what to anticipate after stroke.   Prognosis: Limited overall in the setting of large stroke, nutritional deficits, functional deficits.  Patient is at a high 12-month mortality risk.  Discharge Planning: Discharge plan to be determined  Vitals:   01/02/24 0019 01/02/24 0443  BP: (!) 137/102 (!) 147/115  Pulse: 89 (!) 106  Resp: 16 16  Temp: 97.7 F (36.5 C) 98.1 F (36.7 C)  SpO2: 99% 98%    Intake/Output Summary (Last 24 hours) at 01/02/2024 0710 Last data filed at 01/01/2024 1630 Gross per 24 hour  Intake 339.86 ml  Output --  Net 339.86 ml   Last Weight  Most recent update: 01/01/2024  1:05 PM    Weight  60.5 kg (133 lb 6.1 oz)  Gen: Chronically ill-appearing elderly African-American female HEENT: moist mucous membranes CV: Regular rate and irregular rhythm  PULM: On room air breathing is even and nonlabored ABD: soft/nontender EXT: No edema Neuro: Somnolent  PPS: 10%   This conversation/these recommendations were discussed with patient primary care team, Dr. Jonel  ______________________________________________________ Priscilla Thornton [facility] Palliative Medicine Team Team Cell Phone: [phone] Please utilize secure chat  with additional questions, if there is no response within 30 minutes please call the above phone number  Total Time: 75 Billing based on MDM: High  Palliative Medicine Team providers are available by phone from 7am to 7pm daily and can be reached through the team cell phone.  Should this patient require assistance outside of these hours, please call the patient's attending physician.

## 2024-01-02 NOTE — Telephone Encounter (Signed)
 Patient Product/process development scientist completed.    The patient is insured through [employer]. Patient has Medicare and is not eligible for a copay card, but may be able to apply for patient assistance or Medicare RX Payment Plan (Patient Must reach out to their plan, if eligible for payment plan), if available.    Ran test claim for Eliquis 5 mg and the current 30 day co-pay is $40.00.   This test claim was processed through [facility]- copay amounts may vary at other pharmacies due to pharmacy/plan contracts, or as the patient moves through the different stages of their insurance plan.     Roland Earl, CPHT Pharmacy Technician III Certified Patient Advocate [facility] Patient Advocate Team Direct Number: [phone]  Fax: [phone]

## 2024-01-02 NOTE — Plan of Care (Signed)
  Problem: Self-Care: Goal: Verbalization of feelings and concerns over difficulty with self-care will improve Outcome: Progressing   Problem: Nutrition: Goal: Risk of aspiration will decrease Outcome: Progressing

## 2024-01-03 DIAGNOSIS — E785 Hyperlipidemia, unspecified: Secondary | ICD-10-CM | POA: Diagnosis not present

## 2024-01-03 DIAGNOSIS — I69391 Dysphagia following cerebral infarction: Secondary | ICD-10-CM | POA: Diagnosis not present

## 2024-01-03 DIAGNOSIS — I63311 Cerebral infarction due to thrombosis of right middle cerebral artery: Secondary | ICD-10-CM | POA: Diagnosis not present

## 2024-01-03 DIAGNOSIS — I4891 Unspecified atrial fibrillation: Secondary | ICD-10-CM | POA: Diagnosis not present

## 2024-01-03 DIAGNOSIS — I63411 Cerebral infarction due to embolism of right middle cerebral artery: Secondary | ICD-10-CM | POA: Diagnosis not present

## 2024-01-03 LAB — GLUCOSE, CAPILLARY
Glucose-Capillary: 144 mg/dL — ABNORMAL HIGH (ref 70–99)
Glucose-Capillary: 130 mg/dL — ABNORMAL HIGH (ref 70–99)
Glucose-Capillary: 144 mg/dL — ABNORMAL HIGH (ref 70–99)
Glucose-Capillary: 151 mg/dL — ABNORMAL HIGH (ref 70–99)
Glucose-Capillary: 132 mg/dL — ABNORMAL HIGH (ref 70–99)

## 2024-01-03 MED ORDER — HEPARIN SODIUM (PORCINE) 5000 UNIT/ML IJ SOLN
5000.0000 [IU] | Freq: Three times a day (TID) | INTRAMUSCULAR | Status: DC
  Administered 2024-01-03 – 2024-01-06 (×11): 5000 [IU] via SUBCUTANEOUS
  Filled 2024-01-03 (×11): qty 1

## 2024-01-03 NOTE — Progress Notes (Addendum)
 Physical Therapy Treatment Patient Details Name: Priscilla Thornton MRN: [id number] DOB: Dec 06, 1945 Today's Date: 01/03/2024   History of Present Illness Pt is a 78 y/o F admitted on 12/31/23 after presenting a well check was called on family as pt had not been seen since Saturday (12/29/23). Pt was found down, found to be aphasic with L hemiparesis, R sided deviated gaze. 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 PMH: hyperthyroidism, RA, HTN, a-fib not on AC    PT Comments  Pt received in supine with very limited verbalizations throughout session and mostly to pt's granddaughter. Pt distracted throughout session by trying to grab objects on R side requiring redirection. Pt demonstrates L lateral lean requiring assist to correct. Pt briefly demonstrates slightly improved midline after R elbow prop x2. Pt participates in reaching with RUE with max cues and only to the R despite attempts towards the L. Pt noted to have BM and requires total A for pericare and rolling. Education provided to pt's granddaughter to speak to pt on L side and head positioning in neutral. Pt continues to benefit from PT services to progress toward functional mobility goals.    If plan is discharge home, recommend the following: Two people to help with walking and/or transfers;Two people to help with bathing/dressing/bathroom   Can travel by private vehicle     No  Equipment Recommendations  Hoyer lift;Wheelchair (measurements PT);Hospital bed;Wheelchair cushion (measurements PT)    Recommendations for Other Services       Precautions / Restrictions Precautions Precautions: Fall Recall of Precautions/Restrictions: Impaired Precaution/Restrictions Comments: L hemi Restrictions Weight Bearing Restrictions Per Provider Order: No     Mobility  Bed Mobility Overal bed mobility: Needs Assistance Bed Mobility: Supine to Sit, Sit to Supine     Supine to sit: Max assist,  HOB elevated, Used rails     General bed mobility comments: dense cues for sequencing and max A for all aspects. Pt tends to grab rail or objects with R hand and requires dense cues to release    Transfers                   General transfer comment: deferred    Ambulation/Gait                   Stairs             Wheelchair Mobility     Tilt Bed    Modified Rankin (Stroke Patients Only) Modified Rankin (Stroke Patients Only) Pre-Morbid Rankin Score: No symptoms Modified Rankin: Severe disability     Balance Overall balance assessment: Needs assistance Sitting-balance support: Feet supported, Single extremity supported, No upper extremity supported Sitting balance-Leahy Scale: Poor Sitting balance - Comments: min-mod A sitting EOB with dense cues Postural control: Left lateral lean                                  Communication Communication Communication: No apparent difficulties Factors Affecting Communication: Difficulty expressing self  Cognition Arousal: Alert Behavior During Therapy: Flat affect   PT - Cognitive impairments: Difficult to assess Difficult to assess due to: Impaired communication                     PT - Cognition Comments: very limited verbalizations, pt responding better to granddaughter present Following commands: Impaired Following commands impaired: Follows one step  commands with increased time, Follows one step commands inconsistently    Cueing Cueing Techniques: Verbal cues, Visual cues, Gestural cues, Tactile cues  Exercises Other Exercises Other Exercises: dynamic reaching in sitting with RUE x4    General Comments General comments (skin integrity, edema, etc.): Pt's granddaughter present and supportive throughout session      Pertinent Vitals/Pain Pain Assessment Pain Assessment: Faces Faces Pain Scale: No hurt     PT Goals (current goals can now be found in the care plan  section) Acute Rehab PT Goals PT Goal Formulation: Patient unable to participate in goal setting Time For Goal Achievement: 01/15/24 Progress towards PT goals: Progressing toward goals    Frequency    Min 2X/week       AM-PAC PT 6 Clicks Mobility   Outcome Measure  Help needed turning from your back to your side while in a flat bed without using bedrails?: Total Help needed moving from lying on your back to sitting on the side of a flat bed without using bedrails?: Total Help needed moving to and from a bed to a chair (including a wheelchair)?: Total Help needed standing up from a chair using your arms (e.g., wheelchair or bedside chair)?: Total Help needed to walk in hospital room?: Total Help needed climbing 3-5 steps with a railing? : Total 6 Click Score: 6    End of Session   Activity Tolerance: Patient limited by fatigue;Patient tolerated treatment well Patient left: in bed;with bed alarm set;with call bell/phone within reach;with SCD's reapplied;with family/visitor present Nurse Communication: Mobility status PT Visit Diagnosis: Muscle weakness (generalized) (M62.81);Other abnormalities of gait and mobility (R26.89);Difficulty in walking, not elsewhere classified (R26.2);Hemiplegia and hemiparesis;Unsteadiness on feet (R26.81) Hemiplegia - Right/Left: Right Hemiplegia - caused by: Cerebral infarction     Time: 9057-8981 PT Time Calculation (min) (ACUTE ONLY): 36 min  Charges:    $Therapeutic Activity: 23-37 mins PT General Charges $$ ACUTE PT VISIT: 1 Visit                     Darryle George, PTA Acute Rehabilitation Services Secure Chat Preferred  [phone]    Darryle George 01/03/2024, 11:15 AM

## 2024-01-03 NOTE — Progress Notes (Signed)
 STROKE TEAM PROGRESS NOTE   INTERIM HISTORY/SUBJECTIVE Granddaughter is at bedside.  Patient lying bed, eyes open, able to tell me her name in severely dysarthric voice and very psychomotor slowing. No significant neuro change comparing with yesterday. Daughter is trying to get pt rehab in [place] where all the families are. Will repeat CT in am.   OBJECTIVE  CBC    Component Value Date/Time   WBC 5.8 01/02/2024 0439   RBC 4.23 01/02/2024 0439   HGB 11.2 (L) 01/02/2024 0439   HGB 11.4 04/23/2023 1104   HCT 37.1 01/02/2024 0439   HCT 36.4 04/23/2023 1104   PLT 245 01/02/2024 0439   PLT 341 04/23/2023 1104   MCV 87.7 01/02/2024 0439   MCV 87 04/23/2023 1104   MCH 26.5 01/02/2024 0439   MCHC 30.2 01/02/2024 0439   RDW 13.4 01/02/2024 0439   RDW 13.2 04/23/2023 1104   LYMPHSABS 0.8 12/31/2023 1506   LYMPHSABS 1.3 04/23/2023 1104   MONOABS 0.5 12/31/2023 1506   EOSABS 0.0 12/31/2023 1506   EOSABS 0.1 04/23/2023 1104   BASOSABS 0.0 12/31/2023 1506   BASOSABS 0.0 04/23/2023 1104    BMET    Component Value Date/Time   NA 141 01/02/2024 0439   NA 142 08/14/2023 1630   K 3.7 01/02/2024 0439   CL 112 (H) 01/02/2024 0439   CO2 19 (L) 01/02/2024 0439   GLUCOSE 129 (H) 01/02/2024 0439   BUN 30 (H) 01/02/2024 0439   BUN 22 08/14/2023 1630   CREATININE 0.90 01/02/2024 0439   CALCIUM  8.7 (L) 01/02/2024 0439   EGFR 46 (L) 08/14/2023 1630   GFRNONAA >60 01/02/2024 0439    IMAGING past 24 hours No results found.   Vitals:   01/03/24 0209 01/03/24 0401 01/03/24 0500 01/03/24 0731  BP: (!) 140/86 (!) 140/95  (!) 145/80  Pulse: (!) 116 (!) 107  (!) 106  Resp: 18 18  16   Temp: 99 F (37.2 C) 97.6 F (36.4 C)  98.6 F (37 C)  TempSrc: Oral Oral  Oral  SpO2: 98% 98%  98%  Weight:   64.4 kg   Height:         PHYSICAL EXAM General: Awake, alert, well-developed patient in no acute distress CV: irregularly irregular heart rate and rhythm. Respiratory:  Regular, unlabored  respirations on room air GI: Abdomen soft and nontender NEURO:  pt awake, alert, eyes open, able to tell me her name and told me age 50 instead of 36, orientated to place but not answer other questions, did not name repeat, able to follow simple commands in the delayed fashion, moderate to severe dysarthria. Right forced gaze, not cross midline, not blinking to visual threat on the left. Left facial droop. Left hemiplegia. RUE at least 3/5 and RLE at least 2/5. Sensation, coordination not corporative and gait not tested    ASSESSMENT/PLAN  Priscilla Thornton is a 78 y.o. 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  CT head reveals a large right MCA territory infarct.  CT angiography reveals severe near occlusive stenosis of the right MCA M1 distal segment and adjacent proximal M2 branch.  Stroke:  right MCA large infarct with petechial HT due to R M1 near occlusion with thrombus, etiology likely due to afib not on [facility] Code Stroke CT head - Large acute right MCA territory infarct with possible mild  petechial hemorrhage. Small infarct within the left cerebellar hemisphere CTA head & neck  Severe near-occlusive stenosis of the right middle cerebral artery mid and distal M1 segment, and of an adjacent proximal right M2 branch. Eccentric filling defect at this site suggesting the presence of thrombus. MRI  Large acute right middle cerebral artery territory infarct. Petechial hemorrhage present within portions of the infarction territory in the right basal ganglia region. CT repeat in am 2D Echo EF 55 to 60% LDL 85 HgbA1c 6.0 UDS neg VTE prophylaxis - heparin subq No antithrombotic prior to admission, now on aspirin 325. Plan to start DOAC tomorrow if CT no bleeding Therapy recommendations: SNF Disposition:  pending, family  lives in [place], searching options for placement in [place]  Atrial fibrillation Hx of AFib found on EKG with PCP Pt did not go to cardiology appointment On home ASA 81, not on AC Now on ASA 325 Consider DOAC tomorrow if CT no bleeding  Hypertension Home meds:  Cozaar  Stable BP goal < 180/105 given HT Long term BP goal normotensive  Hyperlipidemia Home meds:  Atorvastatin  20mg   LDL 85, goal < 70 Now on lipitor 40mg   Continue statin at discharge  Dysphagia Patient has post-stroke dysphagia SLP consulted Now NPO On tube feeding  Other Active Problems Hypothyroidism UA WBC 21-50, on Rocephin  Hospital day # 3   Ary Cummins, MD PhD Stroke Neurology 01/03/2024 11:42 AM       To contact Stroke Continuity provider, please refer to [email or web address]. After hours, contact General Neurology

## 2024-01-03 NOTE — TOC Progression Note (Signed)
 Transition of Care [facility]) - Progression Note    Patient Details  Name: Priscilla Thornton MRN: [id number] Date of Birth: Jul 21, 1945  Transition of [facility]) CM/SW Contact  [name], [place] Phone Number: 01/03/2024, 2:42 PM  Clinical Narrative:   CSW spoke with patient's daughter, [name], to discuss SNF placement options. Daughter is interested in [facility] and [place] and [address], as well as [employer]. CSW looked up facility choices and provided update that [facility] was AIR and likely not going to be able to take the patient, it is not recommended and she may not be able to tolerate the intensity. Daughter was in agreement. She also has a friend who will be providing her with additional names, and she will pass them on to CSW when she has them. CSW to contact facilities to check on availability and send referral.    Expected Discharge Plan: Skilled Nursing Facility Barriers to Discharge: English as a second language teacher, Continued Medical Work up  Expected Discharge Plan and Services     Post Acute Care Choice: Skilled Nursing Facility Living arrangements for the past 2 months: Single Family Home                                       Social Determinants of Health (SDOH) Interventions SDOH Screenings   Food Insecurity: Patient Unable To Answer (01/02/2024)  Housing: Unknown (01/02/2024)  Transportation Needs: Patient Unable To Answer (01/02/2024)  Utilities: Patient Unable To Answer (01/02/2024)  Depression (PHQ2-9): Low Risk  (08/14/2023)  Financial Resource Strain: Low Risk  (01/25/2022)  Physical Activity: Inactive (01/25/2022)  Social Connections: Patient Unable To Answer (01/02/2024)  Stress: No Stress Concern Present (01/25/2022)  Tobacco Use: Medium Risk (01/01/2024)    Readmission Risk Interventions     No data to display

## 2024-01-03 NOTE — Progress Notes (Signed)
 CCMD called pt has 150's-160's HR,sustaining. VS checked, EKG shows Afib with RVR, pt not in distress, no complain of any pain, informed MD C. Hall, aware.  0045 pt now complaint of back pain, PRN Meds given. Also updates Dr. Shona on HR of 120's.

## 2024-01-03 NOTE — Plan of Care (Signed)
  Problem: Ischemic Stroke/TIA Tissue Perfusion: Goal: Complications of ischemic stroke/TIA will be minimized Outcome: Progressing   Problem: Coping: Goal: Will verbalize positive feelings about self Outcome: Progressing   Problem: Health Behavior/Discharge Planning: Goal: Ability to manage health-related needs will improve Outcome: Progressing   Problem: Self-Care: Goal: Ability to participate in self-care as condition permits will improve Outcome: Progressing   Problem: Nutrition: Goal: Risk of aspiration will decrease Outcome: Progressing   Problem: Clinical Measurements: Goal: Will remain free from infection Outcome: Progressing   Problem: Nutrition: Goal: Adequate nutrition will be maintained Outcome: Progressing   Problem: Activity: Goal: Risk for activity intolerance will decrease Outcome: Progressing   Problem: Safety: Goal: Ability to remain free from injury will improve Outcome: Progressing

## 2024-01-03 NOTE — Care Management Important Message (Signed)
 Important Message  Patient Details  Name: Priscilla Thornton MRN: [id number] Date of Birth: 09-06-1945   Important Message Given:  Yes - Medicare IM     [name] 01/03/2024, 4:03 PM

## 2024-01-03 NOTE — Progress Notes (Signed)
 Speech Language Pathology Treatment: Dysphagia;Cognitive-Linguistic  Patient Details Name: Priscilla Thornton MRN: [id number] DOB: [birth year] Today's Date: 01/03/2024 Time: 1200-1230 SLP Time Calculation (min) (ACUTE ONLY): 30 min  Assessment / Plan / Recommendation Clinical Impression  Pt seen for dysphagia tx with oral care provided and suction canister installed by nursing to A with oral care completion with pt eager stating want water throughout po trial.  Pt was able to complete 1-step commands such as stick out your tongue with visual and auditory cues provided (mod) during oral care and po trials.  Pt consumed thin via tsp amounts and ice chips with swallow initiation timing improved (elicited 5 sec with ice chips and 1-2 sec with thin via tsp with mod cues provided by SLP).  Oral holding noted initially, but no anterior loss observed this session.  Pt reaching for cup for water during trial.  Pt exhibited a delayed cough post intake with a wet vocal quality and weak cough observed.  Discussed objective assessment readiness with granddaughter and daughter (via phone) to complete when pt success considered re: timing of study.  Family in agreement.  Pt has Cortrak TF in place for nutritive/hydration purposes.      Pt answered yes/no questions with SLP with delayed processing of information/response time.  She was unable to accurately state granddaughter's name, but did name various family members and her name/DOB.  Time/situation and place were not able to be recalled by pt volitionally this session, but granddaughter stated she was able to name place previously for her.  Pt followed 1-step commands with visual/auditory mod cues by SLP with 50% accuracy obtained.  Pt does not attend to L side, but cues given to turn head when SLP was on L to increase awareness of need for L attention to tasks.  Vocal quality hypophonic and decreased intelligibility noted with words-simple phrases when attempting to  communicate with SLP/granddaughter/nursing staff.  ST will continue to f/u in acute setting for cognitive/linguistic and dysphagia tx.  HPI HPI: Patient is a 78 y.o. female with PMH: RA, HTN, a-fib not on anticoagulant. She works full time at [employer] as a Scientist, physiological. She was found down on 12/31/23 during a well check after family had not heard from her since Saturday (6/21). 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  ST f/u for po readiness/speech/language tx.      SLP Plan  Continue with current plan of care          Recommendations  Diet recommendations: NPO Medication Administration: Via alternative means                  Oral care QID;Oral care prior to ice chip/H20;Staff/trained caregiver to provide oral care   Frequent or constant Supervision/Assistance Aphasia (R47.01);Dysphagia, oropharyngeal phase (R13.12)     Continue with current plan of care     Pat [place] Moss,M.S.,CCC-SLP  01/03/2024, 1:54 PM

## 2024-01-03 NOTE — Care Management Important Message (Signed)
 Important Message  Patient Details  Name: Priscilla Thornton MRN: [id number] Date of Birth: 1946/06/10   Important Message Given:  Yes - Medicare IM     [name] 01/03/2024, 2:40 PM

## 2024-01-03 NOTE — Plan of Care (Signed)
 Family and friends have been at bedside all day.  Has been talkative and alert.  Looking around and talking but conversation babbled at times.    Problem: Nutrition: Goal: Risk of aspiration will decrease Outcome: Progressing   Problem: Nutrition: Goal: Dietary intake will improve Outcome: Progressing   Problem: Clinical Measurements: Goal: Will remain free from infection Outcome: Progressing   Problem: Clinical Measurements: Goal: Respiratory complications will improve Outcome: Progressing   Problem: Activity: Goal: Risk for activity intolerance will decrease Outcome: Progressing

## 2024-01-03 NOTE — Progress Notes (Signed)
  Progress Note   Patient: Priscilla Thornton FMW:[id number] DOB: 01-May-1946 DOA: 12/31/2023     3 DOS: the patient was seen and examined on 01/03/2024 at 10:39AM      Brief hospital course: 78 y.o. F with RA on Arava, hyperthyroidism on methimazole, HTN, CKD IIIa baseline 1.1 who presented with large MCA stroke after being found down.       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   - Non-invasive angiography showed discrete right MCA occlusion - Echocardiogram showed normal EF - Carotid imaging showed no high grade stenosis - Lipids ordered: LDL 85, continue increased dose Lipitor - Continue aspirin 325 - Repeat CT head tomorrow, plan for DOAC if no increased bleeding -Continue tube feeds and free water - Consult Palliative Care    Hypernatremia Resolved  Persistent atrial fibrillation (HCC) New onset.  Rate controlled.  CHA2DS2-Vasc 5 (age, HTN, stroke).  Echocardiogram shows normal EF, normal valves. - CT tomorrow - Possibly begin DOAC tomorrow  UTI (urinary tract infection) UA with bacteria, urine culture not sent. - Continue Rocephin, day 3 of 5  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 (HCC) - Continue Arava  Mixed hyperlipidemia - Continue Lipitor  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) Permissive hypertension for now, goal less than 180/105 - Hold losartan /HCTZ, will plan for resumption in the coming days  Hyperthyroidism TSH normal here. - Continue methimazole          Subjective: No appreciable neurological change.  Still very weak.  Able to mouth some words, follow commands.     Physical Exam: BP (!) 145/80 (BP Location: Left Arm)   Pulse (!) 106   Temp 98.6 F (37 C) (Oral)   Resp 16   Ht 5' 5 (1.651 m)   Wt 64.4 kg   SpO2 98%   BMI 23.63 kg/m   Elderly adult female, lying in  bed, appears weak and tired, core track in place Heart rhythm irregularly irregular, normal rate, no murmurs, no peripheral pitting, but diffuse mild edema Respiratory rate diminished but no rales or wheezes appreciated Abdomen soft, no tenderness palpation or guarding Sleeping but opens eyes sluggishly to touch, can state name, but very tired, does not repeat questions, has some psychomotor slowing, severe dysarthria, right gaze deviation, left flaccid, right very weak.      Data Reviewed: Discussed with neurology Basic metabolic panel shows elevated BUN to creatinine ratio, normal potassium, sodium CBC shows mild anemia  Echo rviewed and summarized above   Family Communication: Daughter by phone, granddaughter at the bedside    Disposition: Status is: Inpatient         Author: Lonni SHAUNNA Dalton, MD 01/03/2024 1:23 PM  For on call review [email or web address].

## 2024-01-04 ENCOUNTER — Inpatient Hospital Stay (HOSPITAL_COMMUNITY)

## 2024-01-04 ENCOUNTER — Encounter (HOSPITAL_COMMUNITY): Payer: Self-pay | Admitting: Internal Medicine

## 2024-01-04 DIAGNOSIS — I63311 Cerebral infarction due to thrombosis of right middle cerebral artery: Secondary | ICD-10-CM | POA: Diagnosis not present

## 2024-01-04 DIAGNOSIS — I639 Cerebral infarction, unspecified: Secondary | ICD-10-CM | POA: Diagnosis not present

## 2024-01-04 DIAGNOSIS — I69391 Dysphagia following cerebral infarction: Secondary | ICD-10-CM | POA: Diagnosis not present

## 2024-01-04 DIAGNOSIS — I63411 Cerebral infarction due to embolism of right middle cerebral artery: Secondary | ICD-10-CM | POA: Diagnosis not present

## 2024-01-04 DIAGNOSIS — I4891 Unspecified atrial fibrillation: Secondary | ICD-10-CM | POA: Diagnosis not present

## 2024-01-04 DIAGNOSIS — E785 Hyperlipidemia, unspecified: Secondary | ICD-10-CM | POA: Diagnosis not present

## 2024-01-04 LAB — GLUCOSE, CAPILLARY
Glucose-Capillary: 123 mg/dL — ABNORMAL HIGH (ref 70–99)
Glucose-Capillary: 125 mg/dL — ABNORMAL HIGH (ref 70–99)
Glucose-Capillary: 138 mg/dL — ABNORMAL HIGH (ref 70–99)
Glucose-Capillary: 156 mg/dL — ABNORMAL HIGH (ref 70–99)
Glucose-Capillary: 165 mg/dL — ABNORMAL HIGH (ref 70–99)

## 2024-01-04 LAB — PHOSPHORUS: Phosphorus: 2.5 mg/dL (ref 2.5–4.6)

## 2024-01-04 LAB — MAGNESIUM: Magnesium: 1.8 mg/dL (ref 1.7–2.4)

## 2024-01-04 MED ORDER — METOPROLOL TARTRATE 25 MG PO TABS: 25.0000 mg | ORAL_TABLET | Freq: Two times a day (BID) | ORAL | Status: DC

## 2024-01-04 MED ORDER — METOPROLOL TARTRATE 25 MG PO TABS
25.0000 mg | ORAL_TABLET | Freq: Two times a day (BID) | ORAL | Status: DC
  Administered 2024-01-04 – 2024-01-14 (×21): 25 mg
  Filled 2024-01-04 (×21): qty 1

## 2024-01-04 MED ORDER — LEFLUNOMIDE 10 MG PO TABS
10.0000 mg | ORAL_TABLET | Freq: Every day | ORAL | Status: DC
  Administered 2024-01-05 – 2024-01-14 (×10): 10 mg
  Filled 2024-01-04 (×10): qty 1

## 2024-01-04 MED ORDER — FOLIC ACID 1 MG PO TABS
1.0000 mg | ORAL_TABLET | Freq: Every day | ORAL | Status: DC
  Administered 2024-01-05 – 2024-01-14 (×10): 1 mg
  Filled 2024-01-04 (×10): qty 1

## 2024-01-04 MED ORDER — METHIMAZOLE 5 MG PO TABS
5.0000 mg | ORAL_TABLET | Freq: Every day | ORAL | Status: DC
  Administered 2024-01-05 – 2024-01-14 (×10): 5 mg
  Filled 2024-01-04 (×10): qty 1

## 2024-01-04 NOTE — Progress Notes (Signed)
  Progress Note   Patient: Priscilla Thornton FMW:[id number] DOB: 19-May-1946 DOA: 12/31/2023     4 DOS: the patient was seen and examined on 01/04/2024        Brief hospital course: 78 y.o. F with RA on Arava , hyperthyroidism on methimazole , HTN, CKD IIIa baseline 1.1 who presented with large MCA stroke after being found down.       Assessment and Plan: * Stroke [facility]) CT showed no change in hemorrhage.  - Continue aspirin , Lipitor - Replace aspirin  with DOAC after PEG placement   Hypernatremia Resolved  Persistent atrial fibrillation (HCC) Discussed DOAC with nephrology, the balance of benefit for recurrent stroke prevention vs bleeding (intracranial or with PEG placement) favor waiting until PEG placed in a few days  UTI (urinary tract infection) - Continue Rocephin   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 (HCC) - Continue Arava   Mixed hyperlipidemia - Continue Lipitor  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) - Continue metoprolol - Hold ARB/HCT  Hyperthyroidism TSH normal here. - Continue methimazole           Subjective: No change, no nursing concerns.  Some faster HR this afternoon.     Physical Exam: BP (!) 134/92 (BP Location: Right Arm)   Pulse (!) 120   Temp 97.8 F (36.6 C) (Oral)   Resp 16   Ht 5' 5 (1.651 m)   Wt 64.4 kg   SpO2 98%   BMI 23.63 kg/m   Elderly adult female, lying in bed, appears weak and tired, core track in place Heart rhythm irregularly irregular, normal rate, no murmurs, no peripheral pitting, but diffuse mild edema Respiratory rate diminished but no rales or wheezes appreciated Abdomen soft, no tenderness palpation or guarding Sleeping but opens eyes sluggishly to touch, can state name, but very tired, does not repeat questions, has some psychomotor slowing, severe dysarthria, right gaze deviation, left flaccid, right very weak.      Data Reviewed: CBC and  BMP normal   Family Communication: None present    Disposition: Status is: Inpatient         Author: Lonni SHAUNNA Dalton, MD 01/04/2024 3:58 PM  For on call review [email or web address].

## 2024-01-04 NOTE — Progress Notes (Signed)
 Physical Therapy Treatment Patient Details Name: Priscilla Thornton MRN: [id number] DOB: 11/27/1945 Today's Date: 01/04/2024   History of Present Illness Pt is a 78 y/o F admitted on 12/31/23 after presenting a well check was called on family as pt had not been seen since Saturday (12/29/23). Pt was found down, found to be aphasic with L hemiparesis, R sided deviated gaze. 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 PMH: hyperthyroidism, RA, HTN, a-fib not on AC    PT Comments  Pt received in supine and agreeable to session. Pt demonstrates improved verbalizations and participation this session. Pt able to sit to EOB with max A and tolerate 2 standing trials with mod A +2. Pt demonstrates L inattention requiring increased cues and assist for balance and tasks on L side. Pt able to reach towards L with mod A for balance. Pt demonstrates improved sitting balance towards the end of the session. Pt noted to have a BM and requires max A to roll L/R for linen change due to decreased command following. Pt continues to benefit from PT services to progress toward functional mobility goals.    If plan is discharge home, recommend the following: Two people to help with walking and/or transfers;Two people to help with bathing/dressing/bathroom   Can travel by private vehicle     No  Equipment Recommendations  Hoyer lift;Wheelchair (measurements PT);Hospital bed;Wheelchair cushion (measurements PT)    Recommendations for Other Services       Precautions / Restrictions Precautions Precautions: Fall Recall of Precautions/Restrictions: Impaired Precaution/Restrictions Comments: L hemi Restrictions Weight Bearing Restrictions Per Provider Order: No     Mobility  Bed Mobility Overal bed mobility: Needs Assistance Bed Mobility: Supine to Sit, Sit to Supine, Rolling Rolling: Max assist   Supine to sit: Max assist, HOB elevated, Used rails Sit to supine: Used  rails, Max assist   General bed mobility comments: assist for BLE (L>R) and trunk management. Rolling L/R for linen change with max A    Transfers Overall transfer level: Needs assistance Equipment used: 2 person hand held assist Transfers: Sit to/from Stand Sit to Stand: Mod assist, +2 physical assistance           General transfer comment: STS from EOB with mod A +2 for power up. Increased cues for initiation. L knee block    Ambulation/Gait                   Stairs             Wheelchair Mobility     Tilt Bed    Modified Rankin (Stroke Patients Only) Modified Rankin (Stroke Patients Only) Pre-Morbid Rankin Score: No symptoms Modified Rankin: Severe disability     Balance Overall balance assessment: Needs assistance Sitting-balance support: Feet supported, Single extremity supported, No upper extremity supported Sitting balance-Leahy Scale: Poor Sitting balance - Comments: L lateral lean requiring mod-max A fading to min with brief CGA towards the end of the session   Standing balance support: Single extremity supported Standing balance-Leahy Scale: Poor Standing balance comment: mod A +2                            Communication Communication Communication: No apparent difficulties Factors Affecting Communication: Difficulty expressing self  Cognition Arousal: Alert Behavior During Therapy: Flat affect   PT - Cognitive impairments: Difficult to assess Difficult to assess due to: Impaired communication  Following commands: Impaired Following commands impaired: Follows one step commands with increased time, Follows one step commands inconsistently    Cueing Cueing Techniques: Verbal cues, Visual cues, Gestural cues, Tactile cues  Exercises Other Exercises Other Exercises: dynamic reaching in sitting with RUE x4 Other Exercises: PROM hip/knee flexion LLE x3    General Comments        Pertinent  Vitals/Pain Pain Assessment Pain Assessment: Faces Faces Pain Scale: Hurts little more Pain Location: headache, back Pain Descriptors / Indicators: Discomfort, Grimacing Pain Intervention(s): Limited activity within patient's tolerance, Monitored during session     PT Goals (current goals can now be found in the care plan section) Acute Rehab PT Goals PT Goal Formulation: Patient unable to participate in goal setting Time For Goal Achievement: 01/15/24 Progress towards PT goals: Progressing toward goals    Frequency    Min 2X/week      PT Plan      Co-evaluation PT/OT/SLP Co-Evaluation/Treatment: Yes Reason for Co-Treatment: Complexity of the patient's impairments (multi-system involvement);To address functional/ADL transfers PT goals addressed during session: Mobility/safety with mobility;Balance;Strengthening/ROM        AM-PAC PT 6 Clicks Mobility   Outcome Measure  Help needed turning from your back to your side while in a flat bed without using bedrails?: A Lot Help needed moving from lying on your back to sitting on the side of a flat bed without using bedrails?: A Lot Help needed moving to and from a bed to a chair (including a wheelchair)?: Total Help needed standing up from a chair using your arms (e.g., wheelchair or bedside chair)?: Total Help needed to walk in hospital room?: Total Help needed climbing 3-5 steps with a railing? : Total 6 Click Score: 8    End of Session Equipment Utilized During Treatment: Gait belt Activity Tolerance: Patient tolerated treatment well Patient left: in bed;with bed alarm set;with call bell/phone within reach;with SCD's reapplied;with family/visitor present Nurse Communication: Mobility status PT Visit Diagnosis: Muscle weakness (generalized) (M62.81);Other abnormalities of gait and mobility (R26.89);Difficulty in walking, not elsewhere classified (R26.2);Hemiplegia and hemiparesis;Unsteadiness on feet (R26.81) Hemiplegia -  Right/Left: Left Hemiplegia - caused by: Cerebral infarction     Time: 8497-8449 PT Time Calculation (min) (ACUTE ONLY): 48 min  Charges:    $Therapeutic Activity: 23-37 mins PT General Charges $$ ACUTE PT VISIT: 1 Visit                     Darryle George, PTA Acute Rehabilitation Services Secure Chat Preferred  [phone]    Darryle George 01/04/2024, 4:07 PM

## 2024-01-04 NOTE — Plan of Care (Signed)
 Pt resting most of the day. Able to express discomfort and needs. New order for A.fib today and pt tolerating well.  Problem: Coping: Goal: Will verbalize positive feelings about self Outcome: Progressing   Problem: Coping: Goal: Will identify appropriate support needs Outcome: Progressing   Problem: Health Behavior/Discharge Planning: Goal: Ability to manage health-related needs will improve Outcome: Progressing

## 2024-01-04 NOTE — Progress Notes (Signed)
 Speech Language Pathology Treatment: Dysphagia;Cognitive-Linguistic  Patient Details Name: Priscilla Thornton MRN: [id number] DOB: 1945/07/24 Today's Date: 01/04/2024 Time: 8881-8864 SLP Time Calculation (min) (ACUTE ONLY): 17 min  Assessment / Plan / Recommendation Clinical Impression  Dysphagia: Pt continues to present with s/sx moderate oral and highly suspected pharyngeal dysphagia. Concern for reduced secretion management given intermittent, subtle wet vocal quality appreciated prior to POs. Pt demonstrating oral holding and prolonged A-P transit despite max multimodal cueing. Oral deficits likely due to oral weakness/incoordination and exacerbated by cognitive deficits and waxing/waning LOA. Pt with seemingly delayed pharyngeal swallow initiation, multiple swallows, delayed throat clearing, and increasing wet vocal quality with PO trials. Given pt's overall clinical presentation, pt is not appropriate for instrumental swallowing evaluation at this time. Recommend continuation of NPO with oral care QID and non-oral means of nutrition, hydration, and medication.   Cognitive-communication: Pt continues to present with cognitive-communication deficits affecting attention (sustained, visual), receptive/expressive language, and speech intelligibility. Pt required max cueing to attend to SLP and items in room on L side. Additionally, pt required multiple repetitions of verbal stimuli and significant increased time to verbally respond. Pt's speech was c/b often single words which were slow, effortful, with articulatory imprecision, and often hypophonic vocal quality. Pt with waxing/waning LOA which is likely impacting pt's ability to sustain attention to all tx tasks.   Given pt's CLOF, d/c recommendation has been changed to SNF.  SLP to continue to f/u acutely.    HPI HPI: Patient is a 78 y.o. female with PMH: RA, HTN, a-fib not on anticoagulant. She works full time at [employer] as a Scientist, physiological.  She was found down on 12/31/23 during a well check after family had not heard from her since Saturday (6/21). 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      SLP Plan  Continue with current plan of care          Recommendations  Diet recommendations: NPO Medication Administration: Via alternative means                  Oral care QID;Oral care prior to ice chip/H20;Staff/trained caregiver to provide oral care   Frequent or constant Supervision/Assistance Dysphagia, oropharyngeal phase (R13.12);Cognitive communication deficit (M58.158)     Continue with current plan of care    Delon Bangs, M.S., CCC-SLP Speech-Language Pathologist Secure Chat Preferred  O: [phone]  [name]  01/04/2024, 11:34 AM

## 2024-01-04 NOTE — Progress Notes (Signed)
 STROKE TEAM PROGRESS NOTE   INTERIM HISTORY/SUBJECTIVE No family is at bedside.  Patient lying bed, eyes open, neuro stable unchanged, still has right gaze and left hemiplegia. Pending PEG Monday. CT repeat this morning showed no bleeding.   OBJECTIVE  CBC    Component Value Date/Time   WBC 5.8 01/02/2024 0439   RBC 4.23 01/02/2024 0439   HGB 11.2 (L) 01/02/2024 0439   HGB 11.4 04/23/2023 1104   HCT 37.1 01/02/2024 0439   HCT 36.4 04/23/2023 1104   PLT 245 01/02/2024 0439   PLT 341 04/23/2023 1104   MCV 87.7 01/02/2024 0439   MCV 87 04/23/2023 1104   MCH 26.5 01/02/2024 0439   MCHC 30.2 01/02/2024 0439   RDW 13.4 01/02/2024 0439   RDW 13.2 04/23/2023 1104   LYMPHSABS 0.8 12/31/2023 1506   LYMPHSABS 1.3 04/23/2023 1104   MONOABS 0.5 12/31/2023 1506   EOSABS 0.0 12/31/2023 1506   EOSABS 0.1 04/23/2023 1104   BASOSABS 0.0 12/31/2023 1506   BASOSABS 0.0 04/23/2023 1104    BMET    Component Value Date/Time   NA 141 01/02/2024 0439   NA 142 08/14/2023 1630   K 3.7 01/02/2024 0439   CL 112 (H) 01/02/2024 0439   CO2 19 (L) 01/02/2024 0439   GLUCOSE 129 (H) 01/02/2024 0439   BUN 30 (H) 01/02/2024 0439   BUN 22 08/14/2023 1630   CREATININE 0.90 01/02/2024 0439   CALCIUM  8.7 (L) 01/02/2024 0439   EGFR 46 (L) 08/14/2023 1630   GFRNONAA >60 01/02/2024 0439    IMAGING past 24 hours CT HEAD WO CONTRAST ( ) Result Date: 01/04/2024 EXAM: CT HEAD WITHOUT 01/04/2024 06:06:40 AM TECHNIQUE: CT of the head was performed without the administration of intravenous contrast. Automated exposure control, iterative reconstruction, and/or weight based adjustment of the mA/kV was utilized to reduce the radiation dose to as low as reasonably achievable. COMPARISON: CT angio head and neck 12/31/2023 and MR head without contrast 01/01/2024. CLINICAL HISTORY: Stroke, follow up. FINDINGS: BRAIN AND VENTRICLES: Expected evolution of a large right MCA territory infarct is noted. The infarct involves  the right lentiform nucleus and internal capsule. Progressive effacement of the sulci is present. Mass effect is again noted on the right lateral ventricle. Midline shift measures 1 to 2 mm. No acute hemorrhage is present. No new or progressive infarct is present. ORBITS: Bilateral lens replacements are noted. The globes and orbits are otherwise within normal limits. SINUSES AND MASTOIDS: A polyp or mucous retention cyst is present in the left maxillary sinus. SOFT TISSUES AND SKULL: No acute skull fracture. No acute soft tissue abnormality. IMPRESSION: 1. Expected evolution of a large right MCA territory infarct involving the right lentiform nucleus and internal capsule, with progressive effacement of the sulci, mass effect on the right lateral ventricle, and midline shift measuring 1 to 2 mm. 2. No acute hemorrhage or new/progressive infarct. Electronically signed by: Lonni Necessary MD 01/04/2024 06:36 AM EDT RP Workstation: [id number]     Vitals:   01/04/24 0743 01/04/24 1139 01/04/24 1248 01/04/24 1605  BP: (!) 147/100 (!) 130/92 (!) 134/92 123/82  Pulse: (!) 109 (!) 101 (!) 120 85  Resp: 20 20 16 20   Temp: (!) 97.5 F (36.4 C) (!) 97.3 F (36.3 C) 97.8 F (36.6 C) 98.2 F (36.8 C)  TempSrc: Oral Oral Oral Oral  SpO2: 97% 98%  100%  Weight:      Height:         PHYSICAL EXAM General: Awake,  alert, well-developed patient in no acute distress CV: irregularly irregular heart rate and rhythm. Respiratory:  Regular, unlabored respirations on room air GI: Abdomen soft and nontender NEURO:  pt awake, alert, eyes open, able to tell me her name and told me age 78 instead of 54, orientated to place but not answer other questions, did not name repeat, able to follow simple commands in the delayed fashion, moderate to severe dysarthria. Right forced gaze, not cross midline, not blinking to visual threat on the left. Left facial droop. Left hemiplegia. RUE at least 3/5 and RLE at least 2/5.  Sensation, coordination not corporative and gait not tested    ASSESSMENT/PLAN  [name] is a 78 y.o. 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  CT head reveals a large right MCA territory infarct.  CT angiography reveals severe near occlusive stenosis of the right MCA M1 distal segment and adjacent proximal M2 branch.  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 Small infarct within the left cerebellar hemisphere CTA head & neck  Severe near-occlusive stenosis of the right middle cerebral artery mid and distal M1 segment, and of an adjacent proximal right M2 branch. Eccentric filling defect at this site suggesting the presence of thrombus. MRI  Large acute right middle cerebral artery territory infarct. Petechial hemorrhage present within portions of the infarction territory in the right basal ganglia region. CT repeat No acute hemorrhage or new/progressive infarct.  2D Echo EF 55 to 60% LDL 85 HgbA1c 6.0 UDS neg VTE prophylaxis - heparin  subq No antithrombotic prior to admission, now on aspirin  325. Plan to start DOAC Monday after PEG placement Therapy recommendations: SNF Disposition:  pending, family lives in [place], searching options for placement in [place]  Atrial fibrillation Hx of AFib found on EKG with PCP Pt did not go to cardiology appointment On home ASA 81, not on [facility] Now on ASA 325 Consider DOAC after PEG placement on Monday  Hypertension Home meds:  Cozaar  Stable Long term BP goal normotensive  Hyperlipidemia Home meds:  Atorvastatin  20mg   LDL 85, goal < 70 Now on lipitor 40mg   Continue  statin at discharge  Dysphagia Patient has post-stroke dysphagia SLP consulted Now NPO On tube feeding Plan for PEG Monday   Other Active Problems Hypothyroidism UA WBC 21-50, on Rocephin   Hospital day # 4  Neurology will sign off. Please call with questions. Pt will follow up with stroke clinic NP at [facility] in about 4 weeks if she stays in [place], otherwise follow up with local neurologist. Thanks for the consult.   Ary Cummins, MD PhD Stroke Neurology 01/04/2024 4:43 PM       To contact Stroke Continuity provider, please refer to [email or web address]. After hours, contact General Neurology

## 2024-01-04 NOTE — Consult Note (Signed)
 Chief Complaint: Cognitive-linguistic dysphagia - image guided percutaneous gastrostomy tube placement   Referring Provider(s): Jonel Bruckner   Supervising Physician: Hughes Simmonds  Patient Status: [facility] - In-pt  History of Present Illness: Priscilla Thornton is a 78 y.o. female with history of arthritis, hyperlipidemia, and hypertension.  Pt presented to the emergency department 12/31/23 with altered mental status.  On emergency department workup, pt was found to have left sided weakness, code stroke was called, and CT head was obtained revealing expected evolution of a large right MCA territory infarct involving the right lentiform nucleus and internal capsule, with progressive effacement of the sulci, mass effect on the right lateral ventricle, and midline shift measuring 1 to 2 mm.  Pt was admitted for further workup and treatment where she was found to have dysphagia.  Interventional radiology was consulted for possible gastrostomy tube placement.  Imaging was reviewed and approved by Dr. Hughes 01/04/24 for image guided gastrostomy tube placement.     Patient is Full Code  Past Medical History:  Diagnosis Date   Arthritis    Hyperlipidemia    Hypertension     History reviewed. No pertinent surgical history.  Allergies: Patient has no known allergies.  Medications: Prior to Admission medications   Medication Sig Start Date End Date Taking? Authorizing Provider  acetaminophen  (TYLENOL ) 500 MG tablet Take 500 mg by mouth every 6 (six) hours as needed for moderate pain (pain score 4-6).    [provider]  aspirin  81 MG tablet Take 81 mg by mouth daily.    [provider]  atorvastatin  (LIPITOR) 20 MG tablet TAKE 1 TABLET(20 MG) BY MOUTH DAILY 10/01/23   Georgina Speaks, FNP  fish oil-omega-3 fatty acids 1000 MG capsule Take 1 g by mouth daily.    [provider]  folic acid  (FOLVITE ) 1 MG tablet Take 1 mg by mouth daily.    [provider]  hydrochlorothiazide  (HYDRODIURIL ) 12.5 MG tablet TAKE 1 TABLET(12.5 MG) BY MOUTH DAILY 09/12/23   Georgina Speaks, FNP  leflunomide  (ARAVA ) 10 MG tablet Take 10 mg by mouth daily.    [provider]  losartan  (COZAAR ) 100 MG tablet TAKE 1 TABLET(100 MG) BY MOUTH DAILY 10/01/23   Georgina Speaks, FNP  methimazole  (TAPAZOLE ) 5 MG tablet Take 5 mg by mouth daily.    [provider]  Multiple Vitamins-Calcium  (ONE-A-DAY WOMENS FORMULA PO) Take 1 tablet by mouth daily.    [provider]  potassium chloride  (KLOR-CON  M) 10 MEQ tablet TAKE 1 TABLET(10 MEQ) BY MOUTH DAILY WITH FOOD 08/07/23   Georgina Speaks, FNP     Family History  Problem Relation Age of Onset   Diabetes Mother    Heart disease Father    Colon cancer Neg Hx    Esophageal cancer Neg Hx    Rectal cancer Neg Hx    Stomach cancer Neg Hx     Social History   Socioeconomic History   Marital status: Single    Spouse name: Not on file   Number of children: Not on file   Years of education: Not on file   Highest education level: Not on file  Occupational History   Occupation: retired  Tobacco Use   Smoking status: Former    Current packs/day: 0.00    Types: Cigarettes    Quit date: 2016    Years since quitting: 9.4   Smokeless tobacco: Never   Tobacco comments:    2-3 cigaretts a day  Vaping Use   Vaping status: Never Used  Substance and Sexual Activity   Alcohol use: No   Drug use: No   Sexual activity: Not Currently  Other Topics Concern   Not on file  Social History Narrative   Not on file   Social Drivers of Health   Financial Resource Strain: Low Risk  (01/25/2022)   Overall Financial Resource Strain (CARDIA)    Difficulty of Paying Living Expenses: Not hard at all  Food Insecurity: Patient Unable To Answer (01/02/2024)   Hunger Vital Sign    Worried About Running Out of Food in the Last Year: Patient unable to answer    Ran Out of Food in the Last Year: Patient unable to answer   Transportation Needs: Patient Unable To Answer (01/02/2024)   PRAPARE - Transportation    Lack of Transportation (Medical): Patient unable to answer    Lack of Transportation (Non-Medical): Patient unable to answer  Physical Activity: Inactive (01/25/2022)   Exercise Vital Sign    Days of Exercise per Week: 0 days    Minutes of Exercise per Session: 0 min  Stress: No Stress Concern Present (01/25/2022)   [employer] of Occupational Health - Occupational Stress Questionnaire    Feeling of Stress : Not at all  Social Connections: Patient Unable To Answer (01/02/2024)   Social Connection and Isolation Panel    Frequency of Communication with Friends and Family: Patient unable to answer    Frequency of Social Gatherings with Friends and Family: Patient unable to answer    Attends Religious Services: Patient unable to answer    Active Member of Clubs or Organizations: Patient unable to answer    Attends Banker Meetings: Patient unable to answer    Marital Status: Patient unable to answer     Review of Systems: A 12 point ROS discussed and pertinent positives are indicated in the HPI above.  All other systems are negative.  Review of Systems  Constitutional:  Negative for chills, fatigue and fever.  Respiratory:  Negative for cough, shortness of breath and wheezing.   Gastrointestinal:  Negative for diarrhea, nausea and vomiting.  Neurological:  Negative for dizziness and headaches.  Psychiatric/Behavioral:  Negative for agitation, behavioral problems and confusion.     Vital Signs: BP (!) 147/100 (BP Location: Right Arm)   Pulse (!) 109   Temp (!) 97.5 F (36.4 C) (Oral)   Resp 20   Ht 5' 5 (1.651 m)   Wt 141 lb 15.6 oz (64.4 kg)   SpO2 97%   BMI 23.63 kg/m   Advance Care Plan: The advanced care place/surrogate decision maker was discussed at the time of visit and the patient did not wish to discuss or was not able to name a surrogate decision maker or  provide an advance care plan.  Physical Exam Constitutional:      Appearance: She is well-developed.  HENT:     Head: Atraumatic.     Mouth/Throat:     Mouth: Mucous membranes are moist.   Cardiovascular:     Rate and Rhythm: Normal rate and regular rhythm.     Heart sounds: No murmur heard. Pulmonary:     Effort: Pulmonary effort is normal.     Breath sounds: Normal breath sounds.  Abdominal:     General: Bowel sounds are normal.     Palpations: Abdomen is soft.   Musculoskeletal:     Comments: L sided weakness s/p stroke    Skin:  General: Skin is warm.   Neurological:     Mental Status: She is alert.     Cranial Nerves: Cranial nerve deficit present.   Psychiatric:        Mood and Affect: Mood normal.        Behavior: Behavior normal.     Imaging: CT HEAD WO CONTRAST ( ) Result Date: 01/04/2024 EXAM: CT HEAD WITHOUT 01/04/2024 06:06:40 AM TECHNIQUE: CT of the head was performed without the administration of intravenous contrast. Automated exposure control, iterative reconstruction, and/or weight based adjustment of the mA/kV was utilized to reduce the radiation dose to as low as reasonably achievable. COMPARISON: CT angio head and neck 12/31/2023 and MR head without contrast 01/01/2024. CLINICAL HISTORY: Stroke, follow up. FINDINGS: BRAIN AND VENTRICLES: Expected evolution of a large right MCA territory infarct is noted. The infarct involves the right lentiform nucleus and internal capsule. Progressive effacement of the sulci is present. Mass effect is again noted on the right lateral ventricle. Midline shift measures 1 to 2 mm. No acute hemorrhage is present. No new or progressive infarct is present. ORBITS: Bilateral lens replacements are noted. The globes and orbits are otherwise within normal limits. SINUSES AND MASTOIDS: A polyp or mucous retention cyst is present in the left maxillary sinus. SOFT TISSUES AND SKULL: No acute skull fracture. No acute soft tissue  abnormality. IMPRESSION: 1. Expected evolution of a large right MCA territory infarct involving the right lentiform nucleus and internal capsule, with progressive effacement of the sulci, mass effect on the right lateral ventricle, and midline shift measuring 1 to 2 mm. 2. No acute hemorrhage or new/progressive infarct. Electronically signed by: Lonni Necessary MD 01/04/2024 06:36 AM EDT RP Workstation: [id number]   ECHOCARDIOGRAM COMPLETE Result Date: 01/01/2024    ECHOCARDIOGRAM REPORT   Patient Name:   Priscilla Thornton Date of Exam: 01/01/2024 Medical Rec #:  [id number]        Height:       65.0 in Accession #:    [id number]       Weight:       133.4 lb Date of Birth:  [birth year]        BSA:          1.665 m Patient Age:    78 years         BP:           147/104 mmHg Patient Gender: F                HR:           83 bpm. Exam Location:  Inpatient Procedure: 2D Echo, Cardiac Doppler and Color Doppler (Both Spectral and Color            Flow Doppler were utilized during procedure). Indications:    Stroke  History:        Patient has no prior history of Echocardiogram examinations.                 Risk Factors:Hypertension.  Sonographer:    Jayson Gaskins Referring Phys: [id number] ANASTASSIA DOUTOVA IMPRESSIONS  1. Left ventricular ejection fraction, by estimation, is 55 to 60%. The left ventricle has normal function. The left ventricle has no regional wall motion abnormalities. Left ventricular diastolic parameters are indeterminate.  2. Right ventricular systolic function is normal. The right ventricular size is not well visualized. Mildly increased right ventricular wall thickness.  3. The mitral valve was not well visualized. No evidence of mitral valve regurgitation. No  evidence of mitral stenosis.  4. Tricuspid valve regurgitation is mild to moderate.  5. The aortic valve was not well visualized. Aortic valve regurgitation is not visualized. No aortic stenosis is present. Comparison(s): No prior Echocardiogram.  FINDINGS  Left Ventricle: Left ventricular ejection fraction, by estimation, is 55 to 60%. The left ventricle has normal function. The left ventricle has no regional wall motion abnormalities. The left ventricular internal cavity size was normal in size. There is  no left ventricular hypertrophy. Left ventricular diastolic parameters are indeterminate. Right Ventricle: The right ventricular size is not well visualized. Mildly increased right ventricular wall thickness. Right ventricular systolic function is normal. Left Atrium: Left atrial size was normal in size. Right Atrium: Right atrial size was normal in size. Pericardium: There is no evidence of pericardial effusion. Mitral Valve: The mitral valve was not well visualized. No evidence of mitral valve regurgitation. No evidence of mitral valve stenosis. Tricuspid Valve: Eccentric jet. The tricuspid valve is normal in structure. Tricuspid valve regurgitation is mild to moderate. No evidence of tricuspid stenosis. Aortic Valve: The aortic valve was not well visualized. Aortic valve regurgitation is not visualized. No aortic stenosis is present. Aortic valve peak gradient measures 3.0 mmHg. Pulmonic Valve: The pulmonic valve was not well visualized. Pulmonic valve regurgitation is not visualized. No evidence of pulmonic stenosis. Aorta: The aortic root is normal in size and structure and the ascending aorta was not well visualized. IAS/Shunts: The interatrial septum was not well visualized.  LEFT VENTRICLE PLAX 2D LVIDd:         4.10 cm LVIDs:         3.60 cm LV PW:         0.70 cm LV IVS:        0.90 cm LVOT diam:     1.80 cm LV SV:         41 LV SV Index:   25 LVOT Area:     2.54 cm  RIGHT VENTRICLE RV S prime:     11.40 cm/s LEFT ATRIUM           Index        RIGHT ATRIUM           Index LA Vol (A2C): 21.1 ml 12.67 ml/m  RA Area:     13.80 cm LA Vol (A4C): 46.4 ml 27.86 ml/m  RA Volume:   34.20 ml  20.54 ml/m  AORTIC VALVE AV Area (Vmax): 2.54 cm AV Vmax:         87.00 cm/s AV Peak Grad:   3.0 mmHg LVOT Vmax:      87.00 cm/s LVOT Vmean:     63.100 cm/s LVOT VTI:       0.161 m  AORTA Ao Root diam: 2.50 cm MITRAL VALVE MV Area (PHT): 4.06 cm    SHUNTS MV Decel Time: 187 msec    Systemic VTI:  0.16 m MV E velocity: 81.80 cm/s  Systemic Diam: 1.80 cm Stanly Leavens MD Electronically signed by Stanly Leavens MD Signature Date/Time: 01/01/2024/3:26:51 PM    Final    MR BRAIN WO CONTRAST Result Date: 01/01/2024 CLINICAL DATA:  Provided history: Stroke, follow-up. EXAM: MRI HEAD WITHOUT CONTRAST TECHNIQUE: Multiplanar, multiecho pulse sequences of the brain and surrounding structures were obtained without intravenous contrast. COMPARISON:  Non-contrast head CT and CT angiogram head/neck 12/31/2023. 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 Associated mass effect with partial effacement of the right lateral ventricle. No midline shift. Petechial hemorrhage within portions of the infarction territory in the right basal ganglia region. Mild multifocal T2 FLAIR hyperintense signal abnormality elsewhere within the cerebral white matter, nonspecific but compatible chronic small vessel ischemic disease. 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 No evidence of an intracranial mass. No extra-axial fluid collection. Vascular: Please see CTA head/neck performed 12/31/2023. Skull and upper cervical spine: No focal worrisome marrow lesion. Sinuses/Orbits: No mass or acute finding within the imaged orbits. Prior bilateral ocular lens replacement. 15 mm left maxillary sinus mucous retention cyst. IMPRESSION: 1. 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 As before, there  is associated mass effect with partial effacement the right lateral ventricle. No midline shift. Petechial hemorrhage present within portions of the infarction territory in the right basal ganglia region. 2. Background mild cerebral white matter chronic small vessel ischemic disease. 3. 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 4. 15 mm left maxillary sinus mucous retention cyst. Electronically Signed   By: Rockey Childs D.O.   On: 01/01/2024 11:18   CT ANGIO HEAD NECK W WO CM Addendum Date: 12/31/2023 ADDENDUM REPORT: 12/31/2023 20:46 ADDENDUM: Reference for recommendation in CTA neck impression #4: J Am Coll Radiol. 2015 Feb;12(2): 143-50. Electronically Signed   By: Rockey Childs D.O.   On: 12/31/2023 20:46   Result Date: 12/31/2023 CLINICAL DATA:  Neuro deficit, acute, stroke suspected. Left-sided flaccid. Weakness. EXAM: CT ANGIOGRAPHY HEAD AND NECK WITH AND WITHOUT CONTRAST TECHNIQUE: Multidetector CT imaging of the head and neck was performed using the standard protocol during bolus administration of intravenous contrast. Multiplanar CT image reconstructions and MIPs were obtained to evaluate the vascular anatomy. Carotid stenosis measurements (when applicable) are obtained utilizing NASCET criteria, using the distal internal carotid diameter as the denominator. RADIATION DOSE REDUCTION: This exam was performed according to the departmental dose-optimization program which includes automated exposure control, adjustment of the mA and/or kV according to patient size and/or use of iterative reconstruction technique. CONTRAST:  75mL OMNIPAQUE  IOHEXOL  350 MG/ML SOLN COMPARISON:  Noncontrast head CT 12/22/2005. FINDINGS: CT HEAD FINDINGS Brain: Large acute right MCA territory infarct affecting the right frontal lobe/insula, temporal lobe, parietal lobe and occipital lobe, as well as the right basal ganglia region. Mild petechial hemorrhage questioned within portions of the infarction  territory. Mild mass effect with partial effacement the right lateral ventricle. No midline shift. Small infarct within the left cerebellar hemisphere, new from the prior head CT of 12/22/2005 but otherwise age-indeterminate. No extra-axial fluid collection. No evidence of an intracranial mass. Vascular: No hyperdense vessel.  Atherosclerotic calcifications. Skull: No calvarial fracture or aggressive osseous lesion. Sinuses/Orbits: No orbital mass or acute orbital finding. 14 mm mucous retention cyst within the left maxillary sinus. Review of the MIP images confirms the above findings Non-contrast head CT impression #1 called by telephone at the time of interpretation on 12/31/2023 at 5:48 pm to provider DAVID YAO , who verbally acknowledged these results. CTA NECK FINDINGS Aortic arch: The proximal innominate, left common carotid and left subclavian arteries are excluded from the field of view. No hemodynamically significant stenosis within the visible innominate or proximal subclavian arteries. Right carotid system: CCA and ICA patent within the neck without stenosis or significant atherosclerotic disease. Left carotid system: The very proximal common carotid artery is excluded  from the field of view. The visible common carotid and internal carotid arteries are patent within the neck without stenosis or significant atherosclerotic disease. Vertebral arteries: Codominant and patent within the neck without stenosis or significant atherosclerotic disease. Skeleton: Cervical spondylosis. No acute fracture or aggressive osseous lesion. The patient is edentulous. Other neck: Multinodular thyroid  gland. The largest nodule is located within the inferior right thyroid  lobe (measuring 2 cm). Upper chest: No consolidation within the imaged lung apices. Review of the MIP images confirms the above findings CTA HEAD FINDINGS Anterior circulation: The intracranial internal carotid arteries are patent. Nonstenotic calcified plaque  within both vessels. Severe, near occlusive stenosis of the right middle cerebral artery mid and distal M1 segment, and of an adjacent proximal right M2 branch. There is an eccentric filling defect at this site suggesting the presence of thrombus (for instance as seen on series 11, images 250 and 251). No left M2 proximal branch occlusion or high-grade proximal stenosis. The anterior cerebral arteries are patent. No intracranial aneurysm is identified. Posterior circulation: The intracranial vertebral arteries are patent. The basilar artery is patent. The posterior cerebral arteries are patent. Hypoplastic right P1 segment with sizable right posterior communicating artery. A left posterior communicating artery is also present. Venous sinuses: Within the limitations of contrast timing, no convincing thrombus. Anatomic variants: As described. Review of the MIP images confirms the above findings CTA head impression #1 called by telephone at the time of interpretation on 12/31/2023 at 5:49 pm to provider DAVID YAO , who verbally acknowledged these results. IMPRESSION: Non-contrast head CT: 1. Large acute right MCA territory infarct with possible mild petechial hemorrhage. Mild mass effect with partial effacement of the right lateral ventricle. No midline shift. 2. Small infarct within the left cerebellar hemisphere, new from the prior head CT of 12/22/2005 but otherwise age-indeterminate. 3. 14 mm left maxillary sinus mucous retention cyst. CTA neck: 1. The very proximal innominate, left common carotid and left subclavian arteries are excluded from the field of view. Within this limitation, findings are as follows. 2. The visible common carotid and internal carotid arteries are patent within the neck without stenosis or significant atherosclerotic disease. 3. Vertebral arteries patent within the neck without stenosis or significant atherosclerotic disease. 4. Multinodular thyroid  gland (with nodules measuring up to 2  cm). A nonemergent thyroid  ultrasound is recommended for further evaluation. CTA head: 1. Severe near-occlusive stenosis of the right middle cerebral artery mid and distal M1 segment, and of an adjacent proximal right M2 branch. Eccentric filling defect at this site suggesting the presence of thrombus. 2. Non-stenotic atherosclerotic plaque within the intracranial internal carotid arteries. Electronically Signed: By: Rockey Childs D.O. On: 12/31/2023 18:11   DG Chest Port 1 View Result Date: 12/31/2023 CLINICAL DATA:  Altered mental status EXAM: PORTABLE CHEST 1 VIEW COMPARISON:  Chest x-ray 12/26/2011 FINDINGS: The heart size and mediastinal contours are within normal limits. Both lungs are clear. The visualized skeletal structures are unremarkable. IMPRESSION: No active disease. Electronically Signed   By: Greig Pique M.D.   On: 12/31/2023 15:48    Labs:  CBC: Recent Labs    04/23/23 1104 12/31/23 1506 12/31/23 1521 01/02/24 0439  WBC 2.5* 4.9  --  5.8  HGB 11.4 11.6* 12.6 11.2*  HCT 36.4 38.0 37.0 37.1  PLT 341 312  --  245    COAGS: Recent Labs    12/31/23 1506  INR 1.2  APTT 25    BMP: Recent Labs    08/14/23  1630 12/31/23 1506 12/31/23 1521 12/31/23 2318 01/02/24 0439  NA 142 145 146* 144 141  K 4.1 4.2 4.0 3.8 3.7  CL 104 109 109 110 112*  CO2 22 24  --  22 19*  GLUCOSE 90 128* 125* 107* 129*  BUN 22 32* 33* 31* 30*  CALCIUM  9.4 9.1  --  8.4* 8.7*  CREATININE 1.21* 1.30* 1.20* 1.16* 0.90  GFRNONAA  --  42*  --  49* >60    LIVER FUNCTION TESTS: Recent Labs    04/23/23 1104 12/31/23 1506  BILITOT 0.3 0.9  AST 21 41  ALT 11 16  ALKPHOS 63 45  PROT 6.7 6.9  ALBUMIN 4.1 3.4*    TUMOR MARKERS: No results for input(s): AFPTM, CEA, CA199, CHROMGRNA in the last 8760 hours.  Assessment and Plan:  Pt is with Cognitive-linguistic dysphagia scheduled for image guided percutaneous gastrostomy tube placement.  Imaging was reviewed and approved by Dr.  Hughes.    Risks and benefits image guided gastrostomy tube placement was discussed with the patient's daughter, Dione Minus, including, but not limited to the need for a barium enema during the procedure, bleeding, infection, peritonitis and/or damage to adjacent structures.  All of the patient's daughter, Demetrus McDaniel, questions were answered, patient is agreeable to proceed.  Consent signed and in IR.  Thank you for allowing our service to participate in MAYBELL MISENHEIMER 's care.  Electronically Signed: Lavanda JAYSON Jurist, PA-C   01/04/2024, 11:05 AM    I spent a total of 40 Minutes    in face to face in clinical consultation, greater than 50% of which was counseling/coordinating care for image guided gastrostomy tube placement.

## 2024-01-04 NOTE — Discharge Instructions (Addendum)

## 2024-01-04 NOTE — Progress Notes (Signed)
 Occupational Therapy Treatment Patient Details Name: Priscilla Thornton MRN: [id number] DOB: 1946-07-03 Today's Date: 01/04/2024   History of present illness Pt is a 78 y/o F admitted on 12/31/23 after presenting a well check was called on family as pt had not been seen since Saturday (12/29/23). Pt was found down, found to be aphasic with L hemiparesis, R sided deviated gaze. 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 PMH: hyperthyroidism, RA, HTN, a-fib not on AC   OT comments  Pt LUE noted to be developing slight elbow flexor tone, still no functional use and pt with strong L inattention to that side almost presenting as neglect of her L arm. She would greatly benefit from more work using mirror therapy when able to. Pt able to engage in EOB balance activities, progressing from max A to brief periods of CGA for sitting balance. Pt follows commands but nothing pertaining to her LUE or using her R arm to locate LUE although she did attend to that side for a period of time to apply lotion. OT to continue working with pt while in acute stay to progress pt as able. DC plans remain appropriate for SNF.       If plan is discharge home, recommend the following:  A lot of help with walking and/or transfers;A lot of help with bathing/dressing/bathroom;Assistance with cooking/housework;Direct supervision/assist for financial management;Supervision due to cognitive status;Help with stairs or ramp for entrance;Assist for transportation;Direct supervision/assist for medications management   Equipment Recommendations  BSC/3in1;Wheelchair (measurements OT);Wheelchair cushion (measurements OT)    Recommendations for Other Services      Precautions / Restrictions Precautions Precautions: Fall Recall of Precautions/Restrictions: Impaired Precaution/Restrictions Comments: L hemi Restrictions Weight Bearing Restrictions Per Provider Order: No       Mobility Bed  Mobility Overal bed mobility: Needs Assistance Bed Mobility: Supine to Sit, Sit to Supine, Rolling Rolling: Max assist   Supine to sit: Max assist, HOB elevated, Used rails Sit to supine: Used rails, Max assist   General bed mobility comments: assist for BLE (L>R) and trunk management. Rolling L/R for linen change with max A    Transfers Overall transfer level: Needs assistance Equipment used: 2 person hand held assist Transfers: Sit to/from Stand Sit to Stand: Mod assist, +2 physical assistance           General transfer comment: STS from EOB with mod A +2 for power up. Increased cues for initiation. L knee block, OT facilitating hips anteriorly and rising of chest.     Balance Overall balance assessment: Needs assistance Sitting-balance support: Feet supported, Single extremity supported, No upper extremity supported Sitting balance-Leahy Scale: Poor Sitting balance - Comments: L lateral lean requiring mod-max A fading to min with brief CGA towards the end of the session Postural control: Left lateral lean Standing balance support: Single extremity supported Standing balance-Leahy Scale: Poor Standing balance comment: mod A +2                           ADL either performed or assessed with clinical judgement   ADL   Eating/Feeding: NPO Eating/Feeding Details (indicate cue type and reason): awaiting PEG, currently Cortrak Grooming: Sitting;Minimal assistance Grooming Details (indicate cue type and reason): Apply lotion to L hand and LLE, pt squeezing the bottle herself with R hand. Pt was more attentive to her L when she was doing majority of the work herself as opposed to  gestural cues and hand/hand assist.                     Toileting- Clothing Manipulation and Hygiene: Total assistance              Extremity/Trunk Assessment Upper Extremity Assessment Upper Extremity Assessment:  (R hand very strong grip, ROM WFL to touch face.) LUE  Deficits / Details: presents more as neglect, no sublux noted. 1/4 MAS tone beginning to develop within the elbow flexors.            Vision   Tracking/Visual Pursuits: Decreased smoothness of eye movement to LEFT inferior field;Decreased smoothness of eye movement to LEFT superior field Additional Comments: Difficult to assess, impaired cognition. Identified # of therapists fingers accurately, R gaze pref but tracks to L with increased verbal/visual cues.   Perception Perception Perception: Impaired Preception Impairment Details: Inattention/Neglect Perception-Other Comments: can shift to L gaze but LUE presents more as neglect.   Praxis Praxis Praxis: Impaired Praxis Impairment Details: Motor planning;Ideation   Communication Communication Communication: No apparent difficulties Factors Affecting Communication: Difficulty expressing self   Cognition Arousal: Alert Behavior During Therapy: Flat affect Cognition: Difficult to assess, Cognition impaired Difficult to assess due to: Impaired communication   Awareness: Online awareness impaired, Intellectual awareness intact Memory impairment (select all impairments):  (NT) Attention impairment (select first level of impairment): Focused attention, Sustained attention Executive functioning impairment (select all impairments): Problem solving, Organization, Reasoning                   Following commands: Impaired Following commands impaired: Follows one step commands with increased time, Follows one step commands inconsistently      Cueing   Cueing Techniques: Verbal cues, Visual cues, Gestural cues, Tactile cues  Exercises Other Exercises Other Exercises: dynamic reaching in sitting with RUE x4 Other Exercises: PROM hip/knee flexion LLE x3    Shoulder Instructions       General Comments Pt's sister present throughout session, pt able to identify her sister .    Pertinent Vitals/ Pain       Pain Assessment Pain  Assessment: Faces Faces Pain Scale: Hurts little more Pain Location: headache, back Pain Descriptors / Indicators: Discomfort, Grimacing Pain Intervention(s): Limited activity within patient's tolerance, Monitored during session  Home Living                                          Prior Functioning/Environment              Frequency  Min 2X/week        Progress Toward Goals  OT Goals(current goals can now be found in the care plan section)  Progress towards OT goals: Progressing toward goals  Acute Rehab OT Goals Patient Stated Goal: To get back to normal functioning OT Goal Formulation: With family Time For Goal Achievement: 01/16/24 Potential to Achieve Goals: Fair  Plan      Co-evaluation    PT/OT/SLP Co-Evaluation/Treatment: Yes Reason for Co-Treatment: Complexity of the patient's impairments (multi-system involvement);To address functional/ADL transfers PT goals addressed during session: Mobility/safety with mobility;Balance;Strengthening/ROM OT goals addressed during session: ADL's and self-care;Strengthening/ROM      AM-PAC OT 6 Clicks Daily Activity     Outcome Measure   Help from another person eating meals?: Total (NPO) Help from another person taking care of personal grooming?: A Little Help from  another person toileting, which includes using toliet, bedpan, or urinal?: Total Help from another person bathing (including washing, rinsing, drying)?: A Lot Help from another person to put on and taking off regular upper body clothing?: A Lot Help from another person to put on and taking off regular lower body clothing?: Total 6 Click Score: 10    End of Session Equipment Utilized During Treatment: Gait belt  OT Visit Diagnosis: Unsteadiness on feet (R26.81);Other abnormalities of gait and mobility (R26.89);Hemiplegia and hemiparesis;Muscle weakness (generalized) (M62.81);Other symptoms and signs involving cognitive  function Hemiplegia - Right/Left: Left Hemiplegia - dominant/non-dominant: Non-Dominant Hemiplegia - 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

## 2024-01-05 DIAGNOSIS — I63411 Cerebral infarction due to embolism of right middle cerebral artery: Secondary | ICD-10-CM | POA: Diagnosis not present

## 2024-01-05 LAB — GLUCOSE, CAPILLARY
Glucose-Capillary: 129 mg/dL — ABNORMAL HIGH (ref 70–99)
Glucose-Capillary: 158 mg/dL — ABNORMAL HIGH (ref 70–99)
Glucose-Capillary: 166 mg/dL — ABNORMAL HIGH (ref 70–99)
Glucose-Capillary: 166 mg/dL — ABNORMAL HIGH (ref 70–99)
Glucose-Capillary: 174 mg/dL — ABNORMAL HIGH (ref 70–99)
Glucose-Capillary: 183 mg/dL — ABNORMAL HIGH (ref 70–99)

## 2024-01-05 MED ORDER — DICLOFENAC SODIUM 1 % EX GEL
2.0000 g | Freq: Four times a day (QID) | CUTANEOUS | Status: DC | PRN
  Administered 2024-01-05 – 2024-01-13 (×3): 2 g via TOPICAL

## 2024-01-05 MED ORDER — LOPERAMIDE HCL 2 MG PO CAPS
2.0000 mg | ORAL_CAPSULE | ORAL | Status: DC | PRN
  Administered 2024-01-05: 2 mg via ORAL
  Filled 2024-01-05: qty 1

## 2024-01-05 MED ORDER — OXYCODONE HCL 5 MG PO TABS
5.0000 mg | ORAL_TABLET | Freq: Three times a day (TID) | ORAL | Status: DC | PRN
  Administered 2024-01-05 – 2024-01-13 (×10): 5 mg via ORAL
  Filled 2024-01-05 (×10): qty 1

## 2024-01-05 MED ORDER — GERHARDT'S BUTT CREAM
TOPICAL_CREAM | Freq: Four times a day (QID) | CUTANEOUS | Status: DC
  Administered 2024-01-07 – 2024-01-13 (×3): 1 via TOPICAL
  Filled 2024-01-05: qty 60

## 2024-01-05 NOTE — Plan of Care (Signed)
 Maintained on Cortrak feeding, tolerated well. No significant changes happen as of this time. Problem: Ischemic Stroke/TIA Tissue Perfusion: Goal: Complications of ischemic stroke/TIA will be minimized 01/05/2024 0508 by Leobardo Zada Ronnald GORMAN, RN Outcome: Progressing 01/05/2024 0506 by Leobardo Zada Ronnald GORMAN, RN Outcome: Progressing   Problem: Coping: Goal: Will verbalize positive feelings about self 01/05/2024 0508 by Leobardo Zada Ronnald GORMAN, RN Outcome: Progressing 01/05/2024 0506 by Leobardo Zada Ronnald GORMAN, RN Outcome: Progressing   Problem: Self-Care: Goal: Ability to participate in self-care as condition permits will improve 01/05/2024 0508 by Leobardo Zada Ronnald GORMAN, RN Outcome: Progressing 01/05/2024 0506 by Leobardo Zada Ronnald GORMAN, RN Outcome: Progressing   Problem: Self-Care: Goal: Ability to communicate needs accurately will improve 01/05/2024 0508 by Leobardo Zada Ronnald GORMAN, RN Outcome: Progressing 01/05/2024 0506 by Leobardo Zada Ronnald GORMAN, RN Outcome: Progressing   Problem: Nutrition: Goal: Risk of aspiration will decrease 01/05/2024 0508 by Leobardo Zada Ronnald GORMAN, RN Outcome: Progressing 01/05/2024 0506 by Leobardo Zada Ronnald GORMAN, RN Outcome: Progressing   Problem: Activity: Goal: Risk for activity intolerance will decrease 01/05/2024 0508 by Leobardo Zada Ronnald GORMAN, RN Outcome: Progressing 01/05/2024 0506 by Leobardo Zada Ronnald GORMAN, RN Outcome: Progressing   Problem: Pain Managment: Goal: General experience of comfort will improve and/or be controlled 01/05/2024 0508 by Leobardo Zada Ronnald GORMAN, RN Outcome: Progressing 01/05/2024 0506 by Leobardo Zada Ronnald GORMAN, RN Outcome: Progressing   Problem: Safety: Goal: Ability to remain free from injury will improve 01/05/2024 0508 by Leobardo Zada Ronnald GORMAN, RN Outcome: Progressing 01/05/2024 0506 by Leobardo Zada Ronnald GORMAN, RN Outcome: Progressing   Problem: Skin Integrity: Goal: Risk for impaired skin integrity will decrease 01/05/2024 0508 by Leobardo Zada Ronnald GORMAN, RN Outcome: Progressing 01/05/2024 0506 by Leobardo Zada Ronnald GORMAN, RN Outcome: Progressing

## 2024-01-05 NOTE — Plan of Care (Signed)

## 2024-01-05 NOTE — Progress Notes (Signed)
  Progress Note   Patient: Priscilla Thornton FMW:[id number] DOB: [birth year] DOA: 12/31/2023     5 DOS: the patient was seen and examined on 01/05/2024 at 11:30A      Brief hospital course: 78 y.o. F with RA on Arava , hyperthyroidism on methimazole , HTN, CKD IIIa baseline 1.1 who presented with large MCA stroke after being found down.       Assessment and Plan: * Stroke [facility]) See prior note - Continue aspirin , Lipitor - Plan to replace aspirin  with DOAC after PEG placement     Persistent atrial fibrillation (HCC) See plans yesterday regarding starting DOAC after PEG placement   UTI (urinary tract infection) Completed 5 days Rocephin   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 (HCC) -Continue Arava   Mixed hyperlipidemia -Continue Lipitor  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) Blood pressure low normal - Hold losartan /HCTZ  Hyperthyroidism TSH normal here. -Continue methimazole           Subjective: Patient makes eye contact with me, answers some questions.  Oriented to hospital, does not answer other questions.  Denies complaints.  Tired, would like the tube out of her nose.  Not really aware of what the tube is for.     Physical Exam: BP 121/86 (BP Location: Right Arm)   Pulse 98   Temp 98.6 F (37 C) (Oral)   Resp 19   Ht 5' 5 (1.651 m)   Wt 68.5 kg   SpO2 91%   BMI 25.13 kg/m   Elderly adult female, lying in bed, weak and tired Tachycardic, irregular, no murmurs, no peripheral edema Respiratory rate normal, lungs clear without rales or wheezes Abdomen soft, no tenderness or ascites The left side is paralyzed, flaccid, she has a right gaze preference, left facial droop  Data Reviewed: Discussed with neurology No new labs    Family Communication: Daughter by phone    Disposition: Status is: Inpatient         Author: Lonni SHAUNNA Dalton, MD 01/05/2024 3:50 PM  For  on call review [email or web address].

## 2024-01-06 ENCOUNTER — Inpatient Hospital Stay (HOSPITAL_COMMUNITY)

## 2024-01-06 DIAGNOSIS — Z515 Encounter for palliative care: Secondary | ICD-10-CM | POA: Diagnosis not present

## 2024-01-06 DIAGNOSIS — I63411 Cerebral infarction due to embolism of right middle cerebral artery: Secondary | ICD-10-CM | POA: Diagnosis not present

## 2024-01-06 DIAGNOSIS — Z7189 Other specified counseling: Secondary | ICD-10-CM | POA: Diagnosis not present

## 2024-01-06 LAB — GLUCOSE, CAPILLARY
Glucose-Capillary: 177 mg/dL — ABNORMAL HIGH (ref 70–99)
Glucose-Capillary: 179 mg/dL — ABNORMAL HIGH (ref 70–99)
Glucose-Capillary: 179 mg/dL — ABNORMAL HIGH (ref 70–99)
Glucose-Capillary: 148 mg/dL — ABNORMAL HIGH (ref 70–99)
Glucose-Capillary: 143 mg/dL — ABNORMAL HIGH (ref 70–99)

## 2024-01-06 NOTE — Plan of Care (Signed)
  Problem: Ischemic Stroke/TIA Tissue Perfusion: Goal: Complications of ischemic stroke/TIA will be minimized Outcome: Progressing   Problem: Health Behavior/Discharge Planning: Goal: Ability to manage health-related needs will improve Outcome: Progressing   Problem: Self-Care: Goal: Ability to communicate needs accurately will improve Outcome: Progressing   Problem: Nutrition: Goal: Risk of aspiration will decrease Outcome: Progressing   Problem: Education: Goal: Knowledge of General Education information will improve Description: Including pain rating scale, medication(s)/side effects and non-pharmacologic comfort measures Outcome: Progressing   Problem: Clinical Measurements: Goal: Will remain free from infection Outcome: Progressing   Problem: Activity: Goal: Risk for activity intolerance will decrease Outcome: Progressing   Problem: Elimination: Goal: Will not experience complications related to bowel motility Outcome: Progressing   Problem: Pain Managment: Goal: General experience of comfort will improve and/or be controlled Outcome: Progressing   Problem: Safety: Goal: Ability to remain free from injury will improve Outcome: Progressing   Problem: Skin Integrity: Goal: Risk for impaired skin integrity will decrease Outcome: Progressing

## 2024-01-06 NOTE — Plan of Care (Signed)

## 2024-01-06 NOTE — Procedures (Signed)
 Modified Barium Swallow Study  Patient Details  Name: Priscilla Thornton MRN: [id number] Date of Birth: 10-31-1945  Today's Date: 01/06/2024  Modified Barium Swallow completed.  Full report located under Chart Review in the Imaging Section.  History of Present Illness Patient is a 78 y.o. female with PMH: RA, HTN, a-fib not on anticoagulant. She works full time at [employer] as a Scientist, physiological. She was found down on 12/31/23 during a well check after family had not heard from her since Saturday (6/21). 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   Clinical Impression Pt presents with a moderate oropharyngeal dysphagia c/b poor bolus awareness, reduced lingual strength and coordination, delayed swallow initiation, incomplete laryngeal closure and diminishe sensation.  These deficits resulted in audible aspiration of thin liquid which was not cleared by reflexive cough, and silent aspiration of nectar thick liquids.  Cued cough initially very weak.  With encouragemnt, pt was able to expel aspirated nectar thick liquid from the trachea to the laryngeal vestibute, but did not attain full clearance from nectar thick liquid from above the vocal folds.  There was no penetration or aspiration of honey thick liquid or puree consistencies.  There was however, prolonged oral holding and inattention to bolus.  Solid texture bolus trial was deferred 2/2 difficulty with oral manipulation and transit of puree bolus.  Esophageal sweep was not completed.  There was trace-minimal retention of contrast intermittently around suspected CP Bar (see image below).  Suspect pt would have a difficult time meeting nutritional needs orally.    Pt planned for PEG tube placement tomorrow.  It would be reasonable to  proceed with PEG for nutritional support while continuing to work swallowing safety and efficiency.  Consider initiating puree diet with honey thick liquids after PEG placement tomorrow.  SLP will continue to follow for above noted deficits.  DIGEST Swallow Severity Rating*  Safety: 3  Efficiency: 0  Overall Pharyngeal Swallow Severity: 3 1: mild; 2: moderate; 3: severe; 4: profound  *The Dynamic Imaging Grade of Swallowing Toxicity is standardized for the head and neck cancer population, however, demonstrates promising clinical applications across populations to standardize the clinical rating of pharyngeal swallow safety and severity.  Suspected CP Bar:   Factors that may increase risk of adverse event in presence of aspiration Noe & Lianne 2021): Presence of tubes (ETT, trach, NG, etc.);Reduced cognitive function  Swallow Evaluation Recommendations Recommendations: PO diet PO Diet Recommendation: Dysphagia 1 (Pureed);Moderately thick liquids (Level 3, honey thick) (following PEG placement planned for 6/30) Medication Administration: Via alternative means Supervision: Full assist for feeding Swallowing strategies  : Small bites/sips;Slow rate;Check for pocketing or oral holding Postural changes: Position pt fully upright for meals Oral care recommendations: Oral care BID (2x/day)      Anette FORBES Grippe, MA, CCC-SLP Acute Rehabilitation Services Office: [phone] 01/06/2024,2:40 PM

## 2024-01-06 NOTE — Progress Notes (Signed)
 Speech Language Pathology Treatment: Dysphagia  Patient Details Name: Priscilla Thornton MRN: [id number] DOB: 1945/08/17 Today's Date: 01/06/2024 Time: 8872-8867 SLP Time Calculation (min) (ACUTE ONLY): 5 min  Assessment / Plan / Recommendation Clinical Impression  Pt seen for ongoing dysphagia management.  Pt awake, alert, pleasant. Vocal quality slightly hoarse/wet on arrival, but cleared during today's session.  Pt required occasional cuing for oral care and PO trials. SLP provided oral care prior to administration of PO trials.  Pt tolerated small amounts of thin liquid by spoon without overt s/s of aspiration.  Pt with brief oral holding on initial trial.  Pt benefited from verbal cue to swallow.  Pt was able to siphon from straw.  Audible swallow noted, and suspect delayed swallow initiation.  Pt would benefit from instrumental assessment prior to administration or PO diet or feeding tube placement. Will scheduled MBSS as SLP and radiology schedule permit.   Recommend pt remain NPO with alternate means of nutrition, hydration, and medication. Pt may have small amounts of unthickened water  by spoon, in moderation, after good oral care, when fully awake/alert, with upright positioning and direct supervision.    HPI HPI: Patient is a 78 y.o. female with PMH: RA, HTN, a-fib not on anticoagulant. She works full time at [employer] as a Scientist, physiological. She was found down on 12/31/23 during a well check after family had not heard from her since Saturday (6/21). 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      SLP Plan  MBS          Recommendations  Diet recommendations: NPO Liquids provided via: Teaspoon Medication Administration: Via alternative means                   Oral care QID;Oral care prior to ice chip/H20;Staff/trained caregiver to provide oral care   Frequent or constant Supervision/Assistance Dysphagia, oropharyngeal phase (R13.12)     MBS     Anette FORBES Grippe, MA, CCC-SLP Acute Rehabilitation Services Office: [phone] 01/06/2024, 11:36 AM

## 2024-01-06 NOTE — Progress Notes (Signed)
   Palliative Medicine Inpatient Follow Up Note HPI: 78 y.o. F with RA on Arava , hyperthyroidism on methimazole , HTN, CKD IIIa baseline 1.1, new afib - presented with large MCA stroke after being found down. Palliative care requested to support additional goals of care conversations.   Today's Discussion 01/06/2024  *Please note that this is a verbal dictation therefore any spelling or grammatical errors are due to the Dragon Medical One system interpretation.  Chart reviewed inclusive of vital signs, progress notes, laboratory results, and diagnostic images.   I met with Zelda at bedside this morning. She was somnolent though arousable. She has no nonverbal signs of distress at this time.   I have called and spoken to patients daughter, [name] this morning. She and I reviewed the plan for G-tube placement tomorrow.   Per daughter SNF placement will be pursued in the [place] area so that Oza is closer to immediate family..   Created space and opportunity for patients daughter to explore thoughts feelings and fears regarding Annies current medical situation.[name] shares that she understands what to anticipate moving forward.   Questions and concerns addressed/Palliative Support Provided.   Objective Assessment: Vital Signs Vitals:   01/06/24 0352 01/06/24 0809  BP: 139/79 113/68  Pulse: 96 96  Resp:  20  Temp: 99.2 F (37.3 C) 98.2 F (36.8 C)  SpO2: 98% 96%    Intake/Output Summary (Last 24 hours) at 01/06/2024 9061 Last data filed at 01/06/2024 9662 Gross per 24 hour  Intake --  Output 600 ml  Net -600 ml   Last Weight  Most recent update: 01/06/2024  6:19 AM    Weight  66.8 kg (147 lb 4.3 oz)            Gen: Chronically ill-appearing elderly African-American female HEENT: Coretrack in place, dry mucous membranes CV: Regular rate and irregular rhythm  PULM: On room air breathing is even and nonlabored ABD: soft/nontender EXT: No edema Neuro: Somnolent  SUMMARY  OF RECOMMENDATIONS   Full code/full scope of care   Plan for G-tube placement tomorrow   Patients family would like for Clemencia to transition to a SNF in [place] once medically ready   The PMT will check in incrementally ______________________________________________________________________________________ Rosaline Becton  Palliative Medicine Team Team Cell Phone: [phone] Please utilize secure chat with additional questions, if there is no response within 30 minutes please call the above phone number  Time Spent: 35 Billing based on MDM: Moderate   Palliative Medicine Team providers are available by phone from 7am to 7pm daily and can be reached through the team cell phone.  Should this patient require assistance outside of these hours, please call the patient's attending physician.

## 2024-01-06 NOTE — Progress Notes (Signed)
  Progress Note   Patient: Priscilla Thornton FMW:[id number] DOB: 10/08/1945 DOA: 12/31/2023     6 DOS: the patient was seen and examined on 01/06/2024 at 10:03AM      Brief hospital course: 78 y.o. F with RA on Arava , hyperthyroidism on methimazole , HTN, CKD IIIa baseline 1.1 who presented with large MCA stroke after being found down.       Assessment and Plan: * Stroke (HCC) - Continue aspirin , Lipitor     Dysphagia Discussed with SLP.  MBS shows no frank aspiration of puree or honey consistency, but oral holding, poor attention, requiring frequent cues.  There is a very low chance she could keep up with fluid and calorie needs in the short term, and PEG is the most reasonable way to allow her nutrition/fluids while she takes the time to improve neurologically. - PEG placement this week - Continue tube feeds per cortrak  Persistent atrial fibrillation (HCC) See above.  Rate controlled.  CHA2DS2-Vasc 5 (age, HTN, stroke) - Eliquis tomorrow after pEG  UTI (urinary tract infection) Completed 5 days Rocephin .  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 - Continue methimazole           Subjective: Patient is slightly more alert today, but still very debilitated, completely weak in the lab, repeat MBS today, showed significant holding, inattention, poor oral intake       Physical Exam: BP 130/88 (BP Location: Right Arm)   Pulse 95   Temp 99.7 F (37.6 C) (Oral)   Resp 20   Ht 5' 5 (1.651 m)   Wt 66.8 kg   SpO2 98%   BMI 24.51 kg/m   Elderly adult female, lying in bed, interactive and appropriate RRR, no murmurs, no peripheral edema Respiratory rate normal, lungs clear without rales or wheezes Abdomen soft, no grimace to  palpation Flaccid on the left side, strength 4/5 on the right, attentive, speaks some only answers a few questions, then falls back asleep    Data Reviewed: No new labs    Family Communication: Daughter by phone    Disposition: Status is: Inpatient         Author: Lonni SHAUNNA Dalton, MD 01/06/2024 2:54 PM  For on call review [email or web address].

## 2024-01-07 ENCOUNTER — Inpatient Hospital Stay (HOSPITAL_COMMUNITY)

## 2024-01-07 DIAGNOSIS — I63411 Cerebral infarction due to embolism of right middle cerebral artery: Secondary | ICD-10-CM | POA: Diagnosis not present

## 2024-01-07 DIAGNOSIS — Z515 Encounter for palliative care: Secondary | ICD-10-CM | POA: Diagnosis not present

## 2024-01-07 HISTORY — PX: IR GASTROSTOMY TUBE MOD SED: IMG625

## 2024-01-07 LAB — GLUCOSE, CAPILLARY
Glucose-Capillary: 102 mg/dL — ABNORMAL HIGH (ref 70–99)
Glucose-Capillary: 111 mg/dL — ABNORMAL HIGH (ref 70–99)
Glucose-Capillary: 138 mg/dL — ABNORMAL HIGH (ref 70–99)
Glucose-Capillary: 145 mg/dL — ABNORMAL HIGH (ref 70–99)
Glucose-Capillary: 165 mg/dL — ABNORMAL HIGH (ref 70–99)
Glucose-Capillary: 83 mg/dL (ref 70–99)

## 2024-01-07 LAB — COMPREHENSIVE METABOLIC PANEL WITH GFR
ALT: 31 U/L (ref 0–44)
AST: 41 U/L (ref 15–41)
Albumin: 2.5 g/dL — ABNORMAL LOW (ref 3.5–5.0)
Alkaline Phosphatase: 43 U/L (ref 38–126)
Anion gap: 11 (ref 5–15)
BUN: 28 mg/dL — ABNORMAL HIGH (ref 8–23)
CO2: 24 mmol/L (ref 22–32)
Calcium: 9.1 mg/dL (ref 8.9–10.3)
Chloride: 102 mmol/L (ref 98–111)
Creatinine, Ser: 1.15 mg/dL — ABNORMAL HIGH (ref 0.44–1.00)
GFR, Estimated: 49 mL/min — ABNORMAL LOW (ref >60)
Glucose, Bld: 108 mg/dL — ABNORMAL HIGH (ref 70–99)
Potassium: 4.8 mmol/L (ref 3.5–5.1)
Sodium: 137 mmol/L (ref 135–145)
Total Bilirubin: 0.9 mg/dL (ref 0.0–1.2)
Total Protein: 6.5 g/dL (ref 6.5–8.1)

## 2024-01-07 LAB — CBC
HCT: 35.1 % — ABNORMAL LOW (ref 36.0–46.0)
Hemoglobin: 10.9 g/dL — ABNORMAL LOW (ref 12.0–15.0)
MCH: 26.7 pg (ref 26.0–34.0)
MCHC: 31.1 g/dL (ref 30.0–36.0)
MCV: 85.8 fL (ref 80.0–100.0)
Platelets: 266 10*3/uL (ref 150–400)
RBC: 4.09 MIL/uL (ref 3.87–5.11)
RDW: 13.2 % (ref 11.5–15.5)
WBC: 4.3 10*3/uL (ref 4.0–10.5)
nRBC: 0 % (ref 0.0–0.2)

## 2024-01-07 LAB — PROTIME-INR
INR: 1.1 (ref 0.8–1.2)
Prothrombin Time: 14.8 s (ref 11.4–15.2)

## 2024-01-07 LAB — MAGNESIUM: Magnesium: 1.9 mg/dL (ref 1.7–2.4)

## 2024-01-07 LAB — PHOSPHORUS: Phosphorus: 3 mg/dL (ref 2.5–4.6)

## 2024-01-07 MED ORDER — CEFAZOLIN SODIUM-DEXTROSE 2-4 GM/100ML-% IV SOLN
INTRAVENOUS | Status: AC
  Filled 2024-01-07: qty 100

## 2024-01-07 MED ORDER — MIDAZOLAM HCL 2 MG/2ML IJ SOLN
INTRAMUSCULAR | Status: AC | PRN
  Administered 2024-01-07 (×2): .5 mg via INTRAVENOUS

## 2024-01-07 MED ORDER — MIDAZOLAM HCL 2 MG/2ML IJ SOLN
INTRAMUSCULAR | Status: AC
Start: 2024-01-07 — End: 2024-01-07
  Filled 2024-01-07: qty 2

## 2024-01-07 MED ORDER — GLUCAGON HCL (RDNA) 1 MG IJ SOLR
INTRAMUSCULAR | Status: AC | PRN
  Administered 2024-01-07: .5 mg via INTRAVENOUS

## 2024-01-07 MED ORDER — IOHEXOL 300 MG/ML  SOLN
50.0000 mL | Freq: Once | INTRAMUSCULAR | Status: AC | PRN
  Administered 2024-01-07: 15 mL

## 2024-01-07 MED ORDER — FENTANYL CITRATE (PF) 100 MCG/2ML IJ SOLN
INTRAMUSCULAR | Status: AC
  Filled 2024-01-07: qty 2

## 2024-01-07 MED ORDER — LACTATED RINGERS IV SOLN: INTRAVENOUS | Status: AC

## 2024-01-07 MED ORDER — APIXABAN 5 MG PO TABS
5.0000 mg | ORAL_TABLET | Freq: Two times a day (BID) | ORAL | Status: DC
  Administered 2024-01-07 – 2024-01-14 (×14): 5 mg via ORAL
  Filled 2024-01-07 (×14): qty 1

## 2024-01-07 MED ORDER — CEFAZOLIN SODIUM-DEXTROSE 2-4 GM/100ML-% IV SOLN
INTRAVENOUS | Status: AC | PRN
  Administered 2024-01-07: 2 g via INTRAVENOUS

## 2024-01-07 MED ORDER — GLUCAGON HCL RDNA (DIAGNOSTIC) 1 MG IJ SOLR
INTRAMUSCULAR | Status: AC
Start: 2024-01-07 — End: 2024-01-07
  Filled 2024-01-07: qty 1

## 2024-01-07 MED ORDER — LIDOCAINE HCL 1 % IJ SOLN
20.0000 mL | Freq: Once | INTRAMUSCULAR | Status: AC
  Administered 2024-01-07: 10 mL via INTRADERMAL

## 2024-01-07 MED ORDER — LIDOCAINE HCL 1 % IJ SOLN
INTRAMUSCULAR | Status: AC
  Filled 2024-01-07: qty 20

## 2024-01-07 MED ORDER — FENTANYL CITRATE (PF) 100 MCG/2ML IJ SOLN
INTRAMUSCULAR | Status: AC | PRN
  Administered 2024-01-07: 50 ug via INTRAVENOUS

## 2024-01-07 NOTE — Procedures (Signed)
 Interventional Radiology Procedure Note  Procedure: Percutaneous gastrostomy tube placement  Complications: None  Estimated Blood Loss: < 5 mL  Findings: 20 Fr bumper retention gastrostomy tube placed with tip in body of stomach. OK to use in 24 hours.  Nonda Bays. Nereida Banning, M.D Pager:  [phone]

## 2024-01-07 NOTE — Plan of Care (Signed)
  Problem: Coping: Goal: Will verbalize positive feelings about self Outcome: Progressing   Problem: Nutrition: Goal: Adequate nutrition will be maintained Outcome: Progressing

## 2024-01-07 NOTE — TOC Progression Note (Addendum)
 Transition of Care [facility]) - Progression Note    Patient Details  Name: Priscilla Thornton MRN: [id number] Date of Birth: 05-16-1946  Transition of [facility]) CM/SW Contact  [name], [place] Phone Number: 01/07/2024, 2:12 PM  Clinical Narrative:   CSW contacted facilities for SNF, [facility] and [facility]. Left messages, awaiting response.   UPDATE: CSW received call back from Kia with [facility], referral sent for review. Referral sent to [facility], and they are able to offer a bed for patient. CSW to follow.    Expected Discharge Plan: Skilled Nursing Facility Barriers to Discharge: English as a second language teacher, Continued Medical Work up  Expected Discharge Plan and Services     Post Acute Care Choice: Skilled Nursing Facility Living arrangements for the past 2 months: Single Family Home                                       Social Determinants of Health (SDOH) Interventions SDOH Screenings   Food Insecurity: Patient Unable To Answer (01/02/2024)  Housing: Unknown (01/02/2024)  Transportation Needs: Patient Unable To Answer (01/02/2024)  Utilities: Patient Unable To Answer (01/02/2024)  Depression (PHQ2-9): Low Risk  (08/14/2023)  Financial Resource Strain: Low Risk  (01/25/2022)  Physical Activity: Inactive (01/25/2022)  Social Connections: Patient Unable To Answer (01/02/2024)  Stress: No Stress Concern Present (01/25/2022)  Tobacco Use: Medium Risk (01/04/2024)    Readmission Risk Interventions     No data to display

## 2024-01-07 NOTE — Progress Notes (Signed)
  Progress Note   Patient: Priscilla Thornton FMW:[id number] DOB: [birth year] DOA: 12/31/2023     7 DOS: the patient was seen and examined on 01/07/2024 at 9:38AM      Brief hospital course: 78 y.o. F with RA on Arava , hyperthyroidism on methimazole , HTN, CKD IIIa baseline 1.1 who presented with large MCA stroke after being found down.       Assessment and Plan: * Stroke (HCC) - Continue aspirin , Lipitor     Dysphagia - PEG placement today - Continue TFs through cortrak  Persistent atrial fibrillation (HCC) - Start Eliquis  UTI (urinary tract infection) Completed 5 days Rocephin .  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 - Continue methimazole   Normocytic anemia Hgb slightly down           Subjective: No change, no nursing concerns       Physical Exam: BP (!) 134/95 (BP Location: Left Arm)   Pulse 68   Temp 98.4 F (36.9 C) (Oral)   Resp 16   Ht 5' 5 (1.651 m)   Wt 66 kg   SpO2 99%   BMI 24.21 kg/m   Elderly adult female, lying in bed, interactive and appropriate RRR, no murmurs, no peripheral edema Respiratory rate normal, lungs clear without rales or wheezes Abdomen soft, no grimace to palpation Flaccid on the left side, strength 4/5 on the right, attentive, speaks some only answers a few questions, then falls back asleep    Data Reviewed: Cr 1.1 on BMP CBC with mild anemia.   Family Communication:      Disposition: Status is: Inpatient         Author: Lonni SHAUNNA Dalton, MD 01/07/2024 5:33 PM  For on call review [email or web address].

## 2024-01-07 NOTE — Progress Notes (Signed)
 Nutrition Follow-up  DOCUMENTATION CODES:   Not applicable  INTERVENTION:  Tube feeding via cortrak/PEG tube: Osmolite 1.2 at 55 ml/h (1320 ml per day) Start at 25 and advance by 10 q 8 hours Free water  flush 75mL q4h Provides 1584 kcal, 73 gm protein, 1082 ml free water  daily (TF+flush = free water ) Continue Multivitamin with minerals daily Once able, recommend transitioning to bolus feeds via PEG: 1 carton Osmolite 1.5 - QID Start with 1/2 carton at first feed and increase to full carton as tolerated. Free water  flush: 60 mL before and after each bolus + 100 mL TID between bolus feeds Regimen provides 1422 calories, 60 gm protein, and 1500 mL free water  daily.   NUTRITION DIAGNOSIS:  Inadequate oral intake related to inability to eat as evidenced by NPO status. - Ongoing   GOAL:  Patient will meet greater than or equal to 90% of their needs - Being met via TF  MONITOR:  TF tolerance, Labs, Weight trends  REASON FOR ASSESSMENT:  Consult Enteral/tube feeding initiation and management  ASSESSMENT:  Pt with hx of HTN and hypothyroidism presented to ED after she was found with AMS at home. Imaging in ED showed a large right MCA infarct with petechial hemorrhage.  6/23 - admitted 6/24 - SLP BSE, NPO recommended 6/25 - cortrak placed 6/29 - MBS, recommend Dysphagia 1/Honey Thick liquids (diet not ordered)  PEG placement today, per radiology note, ok to use in 24 hrs. Cortrak remains in place, not connected to TF. Per RN, ok to use Cortrak at 5 pm.   Ongoing discharge planning occurring, plan for SNF. Recommend transitioning to bolus feeds as able.  Nutrition Related Medications: Folic acid , MVI Labs: reviewed  CBG: 102-179 mg/dL x 24 hrs    Diet Order:   Diet Order             Diet NPO time specified Except for: Sips with Meds  Diet effective now                  EDUCATION NEEDS:  Education needs have been addressed  Skin:  Skin Assessment: Reviewed RN  Assessment  Last BM:  6/30 - Type 7 (medium)  Height:  Ht Readings from Last 1 Encounters:  01/01/24 5' 5 (1.651 m)   Weight:  Wt Readings from Last 1 Encounters:  01/07/24 66 kg   Ideal Body Weight:  56.8 kg  BMI:  Body mass index is 24.21 kg/m.  Estimated Nutritional Needs:  Kcal:  1400-1600 kcal/d Protein:  70-85g/d Fluid:  >1.5L/d   Nestora Glatter RD, LDN Clinical Dietitian

## 2024-01-07 NOTE — Progress Notes (Signed)
   Palliative Medicine Inpatient Follow Up Note HPI: 78 y.o. F with RA on Arava , hyperthyroidism on methimazole , HTN, CKD IIIa baseline 1.1, new afib - presented with large MCA stroke after being found down. Palliative care requested to support additional goals of care conversations.   Today's Discussion 01/07/2024  *Please note that this is a verbal dictation therefore any spelling or grammatical errors are due to the Dragon Medical One system interpretation.  Chart reviewed inclusive of vital signs, progress notes, laboratory results, and diagnostic images.   I met with Priscilla Thornton at bedside this morning. She is joined by her RN, Museum/gallery conservator who shares from her perspective Priscilla Thornton is waking up more today.   Priscilla Thornton does open her eyes when her name is called and can mouth words.  Plan will be for G-tube placement this afternoon.   Goals of patients family are to relocated to [place].    The PMT will remain available as needed though at this time goals have been made clear for full code / full scope of care.  Objective Assessment: Vital Signs Vitals:   01/07/24 1145 01/07/24 1215  BP: (!) 135/98 129/81  Pulse: (!) 109 90  Resp: (!) 24 18  Temp:  (!) 97.5 F (36.4 C)  SpO2: 96% 96%    Intake/Output Summary (Last 24 hours) at 01/07/2024 1319 Last data filed at 01/07/2024 1241 Gross per 24 hour  Intake 826.35 ml  Output 2000 ml  Net -1173.65 ml   Last Weight  Most recent update: 01/07/2024  5:44 AM    Weight  66 kg (145 lb 8.1 oz)            Gen: Chronically ill-appearing elderly African-American female HEENT: Coretrack in place, dry mucous membranes CV: Regular rate and irregular rhythm  PULM: On room air breathing is even and nonlabored ABD: soft/nontender EXT: No edema Neuro: Somnolent  SUMMARY OF RECOMMENDATIONS   Full code/full scope of care   Plan for G-tube placement today  Patients family would like for Priscilla Thornton to transition to a SNF in [place] once medically  ready   The PMT will sign off at this time per conversation with Attending, Dr. Jonel ______________________________________________________________________________________ Priscilla Thornton [facility] Palliative Medicine Team Team Cell Phone: [phone] Please utilize secure chat with additional questions, if there is no response within 30 minutes please call the above phone number  Time Spent: 25  Palliative Medicine Team providers are available by phone from 7am to 7pm daily and can be reached through the team cell phone.  Should this patient require assistance outside of these hours, please call the patient's attending physician.

## 2024-01-07 NOTE — NC FL2 (Signed)
 [place]  MEDICAID [id number] LEVEL OF CARE FORM     IDENTIFICATION  Patient Name: Priscilla Thornton Birthdate: 1945/07/24 Sex: female Admission Date (Current Location): 12/31/2023  [facility] and [place] Number:  Producer, television/film/video and Address:  [facility], [address], [place], [place] [address]      Provider Number: [id number]  Attending Physician Name and Address:  Jonel Lonni SHAUNNA, *  Relative Name and Phone Number:       Current Level of Care: Hospital Recommended Level of Care: Skilled Nursing Facility Prior Approval Number:    Date Approved/Denied:   PASRR Number: [id number]  Discharge Plan: SNF    Current Diagnoses: Patient Active Problem List   Diagnosis Date Noted   Stroke [facility] 12/31/2023   Persistent atrial fibrillation (HCC) 12/31/2023   Hypernatremia 12/31/2023   Low grade fever 08/14/2023   Encounter for annual health examination 04/30/2023   Influenza vaccination declined 04/30/2023   Herpes zoster vaccination declined 04/30/2023   COVID-19 vaccination declined 04/30/2023   UTI (urinary tract infection) 04/30/2023   Irregular heart beat 04/23/2023   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) 12/11/2022   Mixed hyperlipidemia 12/11/2022   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 (HCC) 12/11/2022   Hyperthyroidism 06/03/2018   Arthritis 06/03/2018    Orientation RESPIRATION BLADDER Height & Weight     Self, Place  Normal Incontinent Weight: 145 lb 8.1 oz (66 kg) Height:  5' 5 (165.1 cm)  BEHAVIORAL SYMPTOMS/MOOD NEUROLOGICAL BOWEL NUTRITION STATUS      Incontinent Feeding tube (Osmolite 1.2 at 55 mL/hr)  AMBULATORY STATUS COMMUNICATION OF NEEDS Skin   Extensive Assist Non-Verbally Normal                       Personal Care Assistance Level of Assistance  Bathing, Feeding, Dressing Bathing Assistance: Maximum assistance Feeding assistance: Maximum  assistance Dressing Assistance: Maximum assistance     Functional Limitations Info  Speech     Speech Info: Impaired (dysarthria)    SPECIAL CARE FACTORS FREQUENCY  PT (By licensed PT), OT (By licensed OT), Speech therapy     PT Frequency: 5x/wk OT Frequency: 5x/wk     Speech Therapy Frequency: 5x/wk      Contractures Contractures Info: Not present    Additional Factors Info  Code Status, Allergies Code Status Info: Full Allergies Info: NKA           Current Medications (01/07/2024):  This is the current hospital active medication list Current Facility-Administered Medications  Medication Dose Route Frequency Provider Last Rate Last Admin   acetaminophen  (TYLENOL ) tablet 650 mg  650 mg Oral Q4H PRN Doutova, Anastassia, MD   650 mg at 01/05/24 1152   Or   acetaminophen  (TYLENOL ) 160 MG/5ML solution 650 mg  650 mg Per Tube Q4H PRN Doutova, Anastassia, MD   650 mg at 01/06/24 1739   Or   acetaminophen  (TYLENOL ) suppository 650 mg  650 mg Rectal Q4H PRN Doutova, Anastassia, MD       aspirin  tablet 325 mg  325 mg Per Tube Daily Jerri Pfeiffer, MD   325 mg at 01/04/24 0921   atorvastatin  (LIPITOR) tablet 40 mg  40 mg Per Tube Daily Danford, Lonni SHAUNNA, MD   40 mg at 01/07/24 9157   diclofenac Sodium (VOLTAREN) 1 % topical gel 2 g  2 g Topical QID PRN Danford, Lonni SHAUNNA, MD   2 g at  01/05/24 1749   feeding supplement (OSMOLITE 1.2 CAL) liquid 1,000 mL  1,000 mL Per Tube Continuous Jonel Lonni SQUIBB, MD   Paused at 01/06/24 1244   folic acid  (FOLVITE ) tablet 1 mg  1 mg Per Tube Daily Danford, Lonni SQUIBB, MD   1 mg at 01/07/24 0843   free water  75 mL  75 mL Per Tube Q4H Jonel Lonni SQUIBB, MD   75 mL at 01/06/24 2000   Gerhardt's butt cream   Topical QID Jonel Lonni SQUIBB, MD   Given at 01/07/24 1356   heparin  injection 5,000 Units  5,000 Units Subcutaneous Q8H Jerri Pfeiffer, MD   5,000 Units at 01/06/24 2149   leflunomide  (ARAVA ) tablet 10 mg  10 mg Per Tube  Daily Jonel Lonni SQUIBB, MD   10 mg at 01/07/24 0843   loperamide (IMODIUM) capsule 2 mg  2 mg Oral PRN Danford, Christopher P, MD   2 mg at 01/05/24 1832   methimazole  (TAPAZOLE ) tablet 5 mg  5 mg Per Tube Daily Danford, Christopher P, MD   5 mg at 01/07/24 0844   metoprolol tartrate (LOPRESSOR) tablet 25 mg  25 mg Per Tube BID Jonel Lonni SQUIBB, MD   25 mg at 01/07/24 9157   multivitamin with minerals tablet 1 tablet  1 tablet Per Tube Daily Danford, Christopher P, MD   1 tablet at 01/07/24 9157   Oral care mouth rinse  15 mL Mouth Rinse PRN Danford, Lonni SQUIBB, MD       oxyCODONE (Oxy IR/ROXICODONE) immediate release tablet 5 mg  5 mg Oral Q8H PRN Jonel Lonni SQUIBB, MD   5 mg at 01/05/24 1832     Discharge Medications: Please see discharge summary for a list of discharge medications.  Relevant Imaging Results:  Relevant Lab Results:   Additional Information SS#: [id number]  [name], LCSW

## 2024-01-07 NOTE — Progress Notes (Signed)
 Physical Therapy Treatment Patient Details Name: Priscilla Thornton MRN: [id number] DOB: 09/30/1945 Today's Date: 01/07/2024   History of Present Illness Pt is a 78 y/o F admitted on 12/31/23 after presenting a well check was called on family as pt had not been seen since Saturday (12/29/23). Pt was found down, found to be aphasic with L hemiparesis, R sided deviated gaze. 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 PMH: hyperthyroidism, RA, HTN, a-fib not on AC    PT Comments  Pt received in supine and agreeable to bed level exercises. Pt reports pain at PEG site and declines EOB. Pt able to perform heel slides with RLE and PROM with LLE. Attempted ab/adduction with RLE, however pt not participating. Pt able to state that therapist was touching her left foot, however continues to demonstrate no purposeful movement in LLE. Pt able to hold bed rail to roll to the L for pillow placement to offload bottom, however demonstrates little pulling. Pt continues to benefit from PT services to progress toward functional mobility goals.     If plan is discharge home, recommend the following: Two people to help with walking and/or transfers;Two people to help with bathing/dressing/bathroom   Can travel by private vehicle     No  Equipment Recommendations  Hoyer lift;Wheelchair (measurements PT);Hospital bed;Wheelchair cushion (measurements PT)    Recommendations for Other Services       Precautions / Restrictions Precautions Precautions: Fall Recall of Precautions/Restrictions: Impaired Precaution/Restrictions Comments: L hemi, PEG Restrictions Weight Bearing Restrictions Per Provider Order: No     Mobility  Bed Mobility Overal bed mobility: Needs Assistance   Rolling: Max assist         General bed mobility comments: Rolling L for pillow placement with max A. Pt able to grab rail with RUE, but limited pulling. Pt deferred EOB    Transfers                         Ambulation/Gait                   Stairs             Wheelchair Mobility     Tilt Bed    Modified Rankin (Stroke Patients Only) Modified Rankin (Stroke Patients Only) Pre-Morbid Rankin Score: No symptoms Modified Rankin: Severe disability     Balance       Sitting balance - Comments: nt this session                                    Communication Communication Communication: No apparent difficulties Factors Affecting Communication: Difficulty expressing self  Cognition Arousal: Alert Behavior During Therapy: Flat affect   PT - Cognitive impairments: Difficult to assess Difficult to assess due to: Impaired communication                       Following commands: Impaired Following commands impaired: Follows one step commands with increased time, Follows one step commands inconsistently    Cueing Cueing Techniques: Verbal cues, Visual cues, Gestural cues, Tactile cues  Exercises General Exercises - Lower Extremity Heel Slides: AROM, Supine, Right, 5 reps, PROM, Left    General Comments General comments (skin integrity, edema, etc.): Pt's daughter present throughout session      Pertinent Vitals/Pain Pain Assessment Pain Assessment: Faces  Faces Pain Scale: Hurts even more Pain Location: PEG site Pain Descriptors / Indicators: Discomfort, Grimacing Pain Intervention(s): Limited activity within patient's tolerance, Monitored during session, Repositioned     PT Goals (current goals can now be found in the care plan section) Acute Rehab PT Goals PT Goal Formulation: Patient unable to participate in goal setting Time For Goal Achievement: 01/15/24 Progress towards PT goals: Progressing toward goals    Frequency    Min 2X/week       AM-PAC PT 6 Clicks Mobility   Outcome Measure  Help needed turning from your back to your side while in a flat bed without using bedrails?: A Lot Help  needed moving from lying on your back to sitting on the side of a flat bed without using bedrails?: A Lot Help needed moving to and from a bed to a chair (including a wheelchair)?: Total Help needed standing up from a chair using your arms (e.g., wheelchair or bedside chair)?: Total Help needed to walk in hospital room?: Total Help needed climbing 3-5 steps with a railing? : Total 6 Click Score: 8    End of Session   Activity Tolerance: Patient limited by pain;Patient limited by fatigue Patient left: in bed;with call bell/phone within reach;with family/visitor present;with SCD's reapplied Nurse Communication: Mobility status PT Visit Diagnosis: Muscle weakness (generalized) (M62.81);Other abnormalities of gait and mobility (R26.89);Difficulty in walking, not elsewhere classified (R26.2);Hemiplegia and hemiparesis;Unsteadiness on feet (R26.81) Hemiplegia - Right/Left: Left Hemiplegia - caused by: Cerebral infarction     Time: 8456-8395 PT Time Calculation (min) (ACUTE ONLY): 21 min  Charges:    $Therapeutic Exercise: 8-22 mins PT General Charges $$ ACUTE PT VISIT: 1 Visit                     Darryle George, PTA Acute Rehabilitation Services Secure Chat Preferred  [phone]    Darryle George 01/07/2024, 4:12 PM

## 2024-01-07 NOTE — Plan of Care (Signed)
 Placed on NPO at midnight. On IV fluid as ordered. NO significant changes happen as of this time. Problem: Ischemic Stroke/TIA Tissue Perfusion: Goal: Complications of ischemic stroke/TIA will be minimized Outcome: Progressing   Problem: Coping: Goal: Will verbalize positive feelings about self Outcome: Progressing   Problem: Health Behavior/Discharge Planning: Goal: Ability to manage health-related needs will improve Outcome: Progressing   Problem: Self-Care: Goal: Ability to communicate needs accurately will improve Outcome: Progressing   Problem: Nutrition: Goal: Risk of aspiration will decrease Outcome: Progressing   Problem: Health Behavior/Discharge Planning: Goal: Ability to manage health-related needs will improve Outcome: Progressing   Problem: Activity: Goal: Risk for activity intolerance will decrease Outcome: Progressing   Problem: Pain Managment: Goal: General experience of comfort will improve and/or be controlled Outcome: Progressing   Problem: Safety: Goal: Ability to remain free from injury will improve Outcome: Progressing   Problem: Skin Integrity: Goal: Risk for impaired skin integrity will decrease Outcome: Progressing

## 2024-01-08 DIAGNOSIS — I63411 Cerebral infarction due to embolism of right middle cerebral artery: Secondary | ICD-10-CM | POA: Diagnosis not present

## 2024-01-08 LAB — GLUCOSE, CAPILLARY
Glucose-Capillary: 146 mg/dL — ABNORMAL HIGH (ref 70–99)
Glucose-Capillary: 161 mg/dL — ABNORMAL HIGH (ref 70–99)
Glucose-Capillary: 167 mg/dL — ABNORMAL HIGH (ref 70–99)
Glucose-Capillary: 177 mg/dL — ABNORMAL HIGH (ref 70–99)
Glucose-Capillary: 182 mg/dL — ABNORMAL HIGH (ref 70–99)
Glucose-Capillary: 205 mg/dL — ABNORMAL HIGH (ref 70–99)

## 2024-01-08 LAB — COMPREHENSIVE METABOLIC PANEL WITH GFR
ALT: 29 U/L (ref 0–44)
AST: 45 U/L — ABNORMAL HIGH (ref 15–41)
Albumin: 2.4 g/dL — ABNORMAL LOW (ref 3.5–5.0)
Alkaline Phosphatase: 43 U/L (ref 38–126)
Anion gap: 7 (ref 5–15)
BUN: 31 mg/dL — ABNORMAL HIGH (ref 8–23)
CO2: 28 mmol/L (ref 22–32)
Calcium: 8.9 mg/dL (ref 8.9–10.3)
Chloride: 102 mmol/L (ref 98–111)
Creatinine, Ser: 1.25 mg/dL — ABNORMAL HIGH (ref 0.44–1.00)
GFR, Estimated: 44 mL/min — ABNORMAL LOW (ref >60)
Glucose, Bld: 187 mg/dL — ABNORMAL HIGH (ref 70–99)
Potassium: 4.2 mmol/L (ref 3.5–5.1)
Sodium: 137 mmol/L (ref 135–145)
Total Bilirubin: 0.4 mg/dL (ref 0.0–1.2)
Total Protein: 6.2 g/dL — ABNORMAL LOW (ref 6.5–8.1)

## 2024-01-08 LAB — CBC
HCT: 34.3 % — ABNORMAL LOW (ref 36.0–46.0)
Hemoglobin: 10.6 g/dL — ABNORMAL LOW (ref 12.0–15.0)
MCH: 26.8 pg (ref 26.0–34.0)
MCHC: 30.9 g/dL (ref 30.0–36.0)
MCV: 86.6 fL (ref 80.0–100.0)
Platelets: 313 10*3/uL (ref 150–400)
RBC: 3.96 MIL/uL (ref 3.87–5.11)
RDW: 13.1 % (ref 11.5–15.5)
WBC: 4.8 10*3/uL (ref 4.0–10.5)
nRBC: 0 % (ref 0.0–0.2)

## 2024-01-08 MED ORDER — LOSARTAN POTASSIUM 50 MG PO TABS
100.0000 mg | ORAL_TABLET | Freq: Every day | ORAL | Status: DC
  Administered 2024-01-08 – 2024-01-14 (×7): 100 mg via ORAL
  Filled 2024-01-08 (×7): qty 2

## 2024-01-08 NOTE — Progress Notes (Signed)
  Progress Note   Patient: Priscilla Thornton FMW:[id number] DOB: 28-Apr-1946 DOA: 12/31/2023     8 DOS: the patient was seen and examined on 01/08/2024 at 9:42AM      Brief hospital course: 78 y.o. F with RA on Arava , hyperthyroidism on methimazole , HTN, CKD IIIa baseline 1.1 who presented with large MCA stroke after being found down.       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  Noninvasive CT angiography showed discrete right MCA occlusion.  Follow-up CT showed no development of intraparenchymal hemorrhage.  Echocardiogram showed normal EF, carotid imaging no high-grade stenosis.  LDL 85, Lipitor increased  Started on DOAC, hospital day 4.  Palliative care consulted, family wished for no limitation of care, even at the end of life.  PEG tube placed on 6/30 by IR.     Dysphagia -Continue tube feed - Continue free water  - Continue SLP at SNF, plan for MBS at discretion of outpatient SLP - Consolidate tube feeds per outpatient dietitian   Persistent atrial fibrillation (HCC) Diagnosed last fall, patient missed follow up with Cardiology.  On admission, still in atrial fibrillation.  Repeat CT head negative for hemorrhage, Neurology cleared for anticoagulation - Continue new Eliquis, metoprolol   UTI (urinary tract infection) Completed 5 days Rocephin .   Rheumatoid arthritis - Continue Arava   Mixed hyperlipidemia - Continue Lipitor  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) Blood pressure slightly elevated - Continue new metoprolol - Resume home losartan  - Hold hydrochlorothiazide    Hyperthyroidism TSH normal here. - Continue methimazole   Normocytic anemia Hgb slightly down           Subjective: Patient denies complaints.  She has had no fever, respiratory symptoms.  PEG tube placed yesterday.  Core track removed  today.       Physical Exam: BP (!) 151/94 (BP Location: Right Arm)   Pulse 70   Temp 98.9 F (37.2 C) (Axillary)   Resp 16   Ht 5' 5 (1.651 m)   Wt 63.5 kg   SpO2 99%   BMI 23.30 kg/m   Elderly adult female, lying in bed, interactive and appropriate RRR, no murmurs, no peripheral edema Respiratory rate normal, lungs clear without rales or wheezes Abdomen soft, no grimace to palpation Flaccid on the left side, strength 4/5 on the right, attentive, speaks some only answers a few questions, then falls back asleep, mostly aphasic.    Data Reviewed: Basic metabolic panel shows creatinine 1.2 CBC shows mild anemia, stable, no leukocytosis   Family Communication: Daughter by phone    Disposition: Status is: Inpatient Patient admitted with large right MCA stroke, paralysis on the left side, severe dysphagia, now status post PEG tube.  She is medically stable, will need extensive debilitation, pending transfer to rehab later this week, once facility is chosen        Author: Lonni SHAUNNA Dalton, MD 01/08/2024 4:26 PM  For on call review [email or web address].

## 2024-01-08 NOTE — TOC Progression Note (Signed)
 Transition of Care [facility]) - Progression Note    Patient Details  Name: Priscilla Thornton MRN: [id number] Date of Birth: 1946/06/12  Transition of Care [facility]) CM/SW Contact  [name], [place] Phone Number: 01/08/2024, 3:52 PM  Clinical Narrative:   CSW contacted [facility] to check on status of referral, and referral is awaiting director of nursing review; unsure if a decision will be made today or tomorrow. CSW spoke with patient's daugher, [name], to provide update; [facility] is her first choice, would like to see what the decision is for them. CSW to follow.    Expected Discharge Plan: Skilled Nursing Facility Barriers to Discharge: English as a second language teacher, Continued Medical Work up  Expected Discharge Plan and Services     Post Acute Care Choice: Skilled Nursing Facility Living arrangements for the past 2 months: Single Family Home                                       Social Determinants of Health (SDOH) Interventions SDOH Screenings   Food Insecurity: Patient Unable To Answer (01/02/2024)  Housing: Unknown (01/02/2024)  Transportation Needs: Patient Unable To Answer (01/02/2024)  Utilities: Patient Unable To Answer (01/02/2024)  Depression (PHQ2-9): Low Risk  (08/14/2023)  Financial Resource Strain: Low Risk  (01/25/2022)  Physical Activity: Inactive (01/25/2022)  Social Connections: Patient Unable To Answer (01/02/2024)  Stress: No Stress Concern Present (01/25/2022)  Tobacco Use: Medium Risk (01/04/2024)    Readmission Risk Interventions     No data to display

## 2024-01-08 NOTE — Plan of Care (Signed)
   Problem: Education: Goal: Knowledge of disease or condition will improve Outcome: Progressing

## 2024-01-08 NOTE — Plan of Care (Signed)

## 2024-01-08 NOTE — Progress Notes (Signed)
 Referring Provider(s): Jonel Bruckner   Supervising Physician: Jenna Hacker  Patient Status:  [facility] - In-pt  Chief Complaint:  Cognitive-linguistic dysphagia s/p image guided percutaneous gastrostomy tube placement 01/07/24 with Dr. Luverne   Subjective:  Pt seen sitting up with granddaughter at bedside.  Pt stated she is having no pain at gastrostomy tube site.  All pt and family questions answered.    Allergies: Patient has no known allergies.  Medications: Prior to Admission medications   Medication Sig Start Date End Date Taking? Authorizing Provider  acetaminophen  (TYLENOL ) 500 MG tablet Take 500 mg by mouth every 6 (six) hours as needed for moderate pain (pain score 4-6).    [provider]  aspirin  81 MG tablet Take 81 mg by mouth daily.    [provider]  atorvastatin  (LIPITOR) 20 MG tablet TAKE 1 TABLET(20 MG) BY MOUTH DAILY 10/01/23   Georgina Speaks, FNP  fish oil-omega-3 fatty acids 1000 MG capsule Take 1 g by mouth daily.    [provider]  folic acid  (FOLVITE ) 1 MG tablet Take 1 mg by mouth daily.    [provider]  hydrochlorothiazide  (HYDRODIURIL ) 12.5 MG tablet TAKE 1 TABLET(12.5 MG) BY MOUTH DAILY 09/12/23   Georgina Speaks, FNP  leflunomide  (ARAVA ) 10 MG tablet Take 10 mg by mouth daily.    [provider]  losartan  (COZAAR ) 100 MG tablet TAKE 1 TABLET(100 MG) BY MOUTH DAILY 10/01/23   Georgina Speaks, FNP  methimazole  (TAPAZOLE ) 5 MG tablet Take 5 mg by mouth daily.    [provider]  Multiple Vitamins-Calcium  (ONE-A-DAY WOMENS FORMULA PO) Take 1 tablet by mouth daily.    [provider]  potassium chloride  (KLOR-CON  M) 10 MEQ tablet TAKE 1 TABLET(10 MEQ) BY MOUTH DAILY WITH FOOD 08/07/23   Georgina Speaks, FNP     Vital Signs: BP (!) 145/96 (BP Location: Right Arm)   Pulse 93   Temp (!) 97.5 F (36.4 C) (Axillary)   Resp 16   Ht 5' 5 (1.651 m)   Wt 139 lb 15.9 oz (63.5 kg)   SpO2 100%    BMI 23.30 kg/m   Physical Exam Constitutional:      Appearance: Normal appearance.   Skin:    Comments: Gastrostomy tube site clean, dry, without redness or signs if infection  Gastrostomy tube intact   Neurological:     Mental Status: She is alert.   Psychiatric:        Behavior: Behavior normal.      Labs:  CBC: Recent Labs    12/31/23 1506 12/31/23 1521 01/02/24 0439 01/07/24 0823 01/08/24 0449  WBC 4.9  --  5.8 4.3 4.8  HGB 11.6* 12.6 11.2* 10.9* 10.6*  HCT 38.0 37.0 37.1 35.1* 34.3*  PLT 312  --  245 266 313    COAGS: Recent Labs    12/31/23 1506 01/07/24 0823  INR 1.2 1.1  APTT 25  --     BMP: Recent Labs    12/31/23 2318 01/02/24 0439 01/07/24 0823 01/08/24 0449  NA 144 141 137 137  K 3.8 3.7 4.8 4.2  CL 110 112* 102 102  CO2 22 19* 24 28  GLUCOSE 107* 129* 108* 187*  BUN 31* 30* 28* 31*  CALCIUM  8.4* 8.7* 9.1 8.9  CREATININE 1.16* 0.90 1.15* 1.25*  GFRNONAA 49* >60 49* 44*    LIVER FUNCTION TESTS: Recent Labs    04/23/23 1104 12/31/23 1506 01/07/24 0823 01/08/24 0449  BILITOT 0.3  0.9 0.9 0.4  AST 21 41 41 45*  ALT 11 16 31 29   ALKPHOS 63 45 43 43  PROT 6.7 6.9 6.5 6.2*  ALBUMIN 4.1 3.4* 2.5* 2.4*    Assessment and Plan:  Cognitive-linguistic dysphagia s/p image guided percutaneous gastrostomy tube placement 01/07/24 with Dr. Luverne.    Gastrostomy tube is intact; site is clean, dry, without signs of infection.    Gastrostomy tube is cleared and ready for use.    Please call interventional radiology with any questions.     Electronically Signed: Lavanda JAYSON Jurist, PA-C 01/08/2024, 3:27 PM    I spent a total of 15 Minutes at the the patient's bedside AND on the patient's hospital floor or unit, greater than 50% of which was counseling/coordinating care for Cognitive-linguistic dysphagia s/p image guided percutaneous gastrostomy tube placement 01/07/24 with Dr. Luverne.

## 2024-01-08 NOTE — Progress Notes (Signed)
 Occupational Therapy Treatment Patient Details Name: Priscilla Thornton MRN: [id number] DOB: 03-30-1946 Today's Date: 01/08/2024   History of present illness Pt is a 78 y/o F admitted on 12/31/23 after presenting a well check was called on family as pt had not been seen since Saturday (12/29/23). Pt was found down, found to be aphasic with L hemiparesis, R sided deviated gaze. 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 PMH: hyperthyroidism, RA, HTN, a-fib not on AC   OT comments  Pt supine in bed, lethargic but improved alertness once engaged and sitting upright.  She requires max cueing to attention to and visually scan towards L side, but unable to sustain L gaze without cueing.  She requires max assist for bed mobility, min assist to maintain sitting balance at EOB with preference to posterior/left lean without cueing.  She requires mod assist for grooming, max assist for UB/LB Adls. Limited verbalizations, oriented to place but not time.  Encouraged pillows under L UE due to shoulder subluxation noted.  PROM to L UE from shoulder to hand provided, remains flaccid in hand but noted flexor tone synergy patterns at shoulder (IR) and elbow (flexors).  Remains limited by cognition, L hemiparesis, vision, L neglect, and impaired balance. Continue to recommend <3hrs/day inpatient setting. Will follow.       If plan is discharge home, recommend the following:  A lot of help with walking and/or transfers;A lot of help with bathing/dressing/bathroom;Assistance with cooking/housework;Direct supervision/assist for financial management;Supervision due to cognitive status;Help with stairs or ramp for entrance;Assist for transportation;Direct supervision/assist for medications management   Equipment Recommendations  BSC/3in1;Wheelchair (measurements OT);Wheelchair cushion (measurements OT)    Recommendations for Other Services      Precautions / Restrictions  Precautions Precautions: Fall Recall of Precautions/Restrictions: Impaired Precaution/Restrictions Comments: L hemi, PEG Restrictions Weight Bearing Restrictions Per Provider Order: No       Mobility Bed Mobility Overal bed mobility: Needs Assistance Bed Mobility: Supine to Sit, Sit to Supine     Supine to sit: Max assist, HOB elevated Sit to supine: Max assist, HOB elevated   General bed mobility comments: max assist to transition to/from EOB, using rails towards L side to EOB.  Scooting up in bed with total assist.    Transfers Overall transfer level: Needs assistance   Transfers: Bed to chair/wheelchair/BSC            Lateral/Scoot Transfers: Max assist General transfer comment: simulated lateral scoot towards HOB with max assist before transitioning back to supine     Balance Overall balance assessment: Needs assistance Sitting-balance support: Feet supported, Single extremity supported, No upper extremity supported Sitting balance-Leahy Scale: Poor Sitting balance - Comments: EOB with posterior lean preference, able to lean forward with verbal/tactile cueing but repetition required Postural control: Posterior lean, Left lateral lean                                 ADL either performed or assessed with clinical judgement   ADL Overall ADL's : Needs assistance/impaired     Grooming: Moderate assistance;Sitting Grooming Details (indicate cue type and reason): applying lotion to L hand, needing assist/HOH to sustain attention to task and L UE         Upper Body Dressing : Maximal assistance;Sitting   Lower Body Dressing: Total assistance;Sitting/lateral leans     Toilet Transfer Details (indicate cue type and reason):  simulated lateral scoot towards HOB with max assist         Functional mobility during ADLs: Maximal assistance      Extremity/Trunk Assessment Upper Extremity Assessment Upper Extremity Assessment: LUE  deficits/detail LUE Deficits / Details: inattention to UE.  Subluxation noted in shoulder. No active movement noted, PROM WFL with tone increasing in flexor synergy patterns- esp in elbow flexors and shoulder internal rotators LUE: Subluxation noted LUE Sensation: decreased light touch;decreased proprioception LUE Coordination: decreased fine motor;decreased gross motor   Lower Extremity Assessment Lower Extremity Assessment: Defer to PT evaluation        Vision   Additional Comments: increased time and verbal cueing to track to L side, limited ability to sustain gaze towards L side.   Perception Perception Perception: Impaired Preception Impairment Details: Inattention/Neglect Perception-Other Comments: L side of body and enviroment   Praxis     Communication Communication Communication: Impaired Factors Affecting Communication: Difficulty expressing self (limited verbalizations but clear when speaks)   Cognition Arousal: Lethargic, Alert Behavior During Therapy: Flat affect Cognition: Difficult to assess, Cognition impaired Difficult to assess due to: Impaired communication Orientation impairments: Time (siutation not tested) Awareness: Intellectual awareness impaired, Online awareness impaired Memory impairment (select all impairments): Short-term memory, Working memory Attention impairment (select first level of impairment): Sustained attention Executive functioning impairment (select all impairments): Problem solving, Organization, Reasoning, Initiation, Sequencing OT - Cognition Comments: pt lethargic upon entry, improved alertness once upright at EOB but fatigued throughout session.  She follows some 1 step commands but remains inconsistent.  She has significant L neglect/inattention to enviorment and body.  She is able to verbalize [facility] but when given choices of december and july, reports december.                 Following commands: Impaired Following commands  impaired: Follows one step commands with increased time, Follows one step commands inconsistently      Cueing   Cueing Techniques: Verbal cues, Visual cues, Gestural cues, Tactile cues  Exercises      Shoulder Instructions       General Comments Provided PROM to L UE from shoulder  to hand, elevated on pillow to support shoulder    Pertinent Vitals/ Pain       Pain Assessment Pain Assessment: Faces Faces Pain Scale: No hurt Pain Intervention(s): Monitored during session  Home Living                                          Prior Functioning/Environment              Frequency  Min 2X/week        Progress Toward Goals  OT Goals(current goals can now be found in the care plan section)  Progress towards OT goals: Progressing toward goals (slowly)  Acute Rehab OT Goals Patient Stated Goal: none stated OT Goal Formulation: Patient unable to participate in goal setting Time For Goal Achievement: 01/16/24 Potential to Achieve Goals: Fair  Plan      Co-evaluation                 AM-PAC OT 6 Clicks Daily Activity     Outcome Measure   Help from another person eating meals?: Total Help from another person taking care of personal grooming?: A Lot Help from another person toileting, which includes using toliet, bedpan, or urinal?: Total Help  from another person bathing (including washing, rinsing, drying)?: A Lot Help from another person to put on and taking off regular upper body clothing?: A Lot Help from another person to put on and taking off regular lower body clothing?: Total 6 Click Score: 9    End of Session    OT Visit Diagnosis: Unsteadiness on feet (R26.81);Other abnormalities of gait and mobility (R26.89);Hemiplegia and hemiparesis;Muscle weakness (generalized) (M62.81);Other symptoms and signs involving cognitive function Hemiplegia - Right/Left: Left Hemiplegia - dominant/non-dominant: Non-Dominant Hemiplegia - caused by:  Cerebral infarction   Activity Tolerance Patient limited by fatigue   Patient Left in bed;with call bell/phone within reach;with bed alarm set   Nurse Communication Mobility status        Time: 8792-8769 OT Time Calculation (min): 23 min  Charges: OT General Charges $OT Visit: 1 Visit OT Treatments $Self Care/Home Management : 8-22 mins $Therapeutic Activity: 8-22 mins  Priscilla Thornton, OT Acute Rehabilitation Services Office [phone] Secure Chat Preferred    Priscilla Thornton [place] 01/08/2024, 1:40 PM

## 2024-01-09 DIAGNOSIS — I63411 Cerebral infarction due to embolism of right middle cerebral artery: Secondary | ICD-10-CM | POA: Diagnosis not present

## 2024-01-09 LAB — GLUCOSE, CAPILLARY
Glucose-Capillary: 128 mg/dL — ABNORMAL HIGH (ref 70–99)
Glucose-Capillary: 164 mg/dL — ABNORMAL HIGH (ref 70–99)
Glucose-Capillary: 167 mg/dL — ABNORMAL HIGH (ref 70–99)
Glucose-Capillary: 173 mg/dL — ABNORMAL HIGH (ref 70–99)
Glucose-Capillary: 187 mg/dL — ABNORMAL HIGH (ref 70–99)
Glucose-Capillary: 189 mg/dL — ABNORMAL HIGH (ref 70–99)

## 2024-01-09 LAB — CBC
HCT: 35.5 % — ABNORMAL LOW (ref 36.0–46.0)
Hemoglobin: 11.1 g/dL — ABNORMAL LOW (ref 12.0–15.0)
MCH: 27.1 pg (ref 26.0–34.0)
MCHC: 31.3 g/dL (ref 30.0–36.0)
MCV: 86.6 fL (ref 80.0–100.0)
Platelets: 352 10*3/uL (ref 150–400)
RBC: 4.1 MIL/uL (ref 3.87–5.11)
RDW: 13.1 % (ref 11.5–15.5)
WBC: 5.3 10*3/uL (ref 4.0–10.5)
nRBC: 0 % (ref 0.0–0.2)

## 2024-01-09 LAB — BASIC METABOLIC PANEL WITH GFR
Anion gap: 11 (ref 5–15)
BUN: 33 mg/dL — ABNORMAL HIGH (ref 8–23)
CO2: 24 mmol/L (ref 22–32)
Calcium: 8.9 mg/dL (ref 8.9–10.3)
Chloride: 102 mmol/L (ref 98–111)
Creatinine, Ser: 1.08 mg/dL — ABNORMAL HIGH (ref 0.44–1.00)
GFR, Estimated: 53 mL/min — ABNORMAL LOW (ref >60)
Glucose, Bld: 162 mg/dL — ABNORMAL HIGH (ref 70–99)
Potassium: 4.8 mmol/L (ref 3.5–5.1)
Sodium: 137 mmol/L (ref 135–145)

## 2024-01-09 NOTE — Progress Notes (Addendum)
 Speech Language Pathology Treatment: Dysphagia  Patient Details Name: Priscilla Thornton MRN: [id number] DOB: 08/31/1945 Today's Date: 01/09/2024 Time: 0945 1000  SLP Time Calculation (min) (ACUTE ONLY): 15 min  Assessment / Plan / Recommendation Clinical Impression  Patient seen by SLP for skilled treatment focused on dysphagia goals. Patient was awake, lying on her side and agreeable to PO's. SLP assisted with slight positional changes in bed. After verifying with MD that it was ok to start PO's as recommended by [facility] 6/29, SLP fed patient spoon sips of honey thick juice. She exhibited minimal amount of anterior spillage and suspected delayed swallow initiation but no overt s/s aspiration. After a few spoon sips, she started to close her eyes and move back into side lying position. SLP will continue to follow.    HPI HPI: Patient is a 78 y.o. female with PMH: RA, HTN, a-fib not on anticoagulant. She works full time at [employer] as a Scientist, physiological. She was found down on 12/31/23 during a well check after family had not heard from her since Saturday (6/21). 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      SLP Plan  Continue with current plan of care          Recommendations  Diet recommendations: Dysphagia 1 (puree);Honey-thick liquid Liquids provided via: Teaspoon Medication Administration: Via alternative means Supervision: Trained caregiver to feed patient;Staff to assist with self feeding;Full supervision/cueing for compensatory strategies Compensations: Slow rate;Small sips/bites Postural Changes and/or Swallow Maneuvers: Seated upright 90 degrees                  Staff/trained caregiver to provide oral care;Oral care BID   Frequent or constant  Supervision/Assistance Dysphagia, oropharyngeal phase (R13.12)     Continue with current plan of care     Norleen IVAR Blase, MA, CCC-SLP Speech Therapy

## 2024-01-09 NOTE — Progress Notes (Signed)
 PROGRESS NOTE  Priscilla Thornton FMW:[id number] DOB: [birth year] DOA: 12/31/2023 PCP: Georgina Speaks, FNP   LOS: 9 days   Brief hospital course: 78 y.o. F with RA on Arava , hyperthyroidism on methimazole , HTN, CKD IIIa baseline 1.1 who presented with large MCA stroke after being found down.    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 Noninvasive CT angiography showed discrete right MCA occlusion. Follow-up CT showed no development of intraparenchymal hemorrhage. Echocardiogram showed normal EF, carotid imaging no high-grade stenosis. LDL 85, Lipitor increased. Started on DOAC as per neurology. Palliative care consulted, family wished for no limitation of care, even at the end of life. PEG tube placed on 6/30 by IR. - Awaiting placement    Active problems Dysphagia -Continue tube feed, free water , outpatient speech   Persistent atrial fibrillation (HCC) - Diagnosed last fall, patient missed follow up with Cardiology.  On admission, still in atrial fibrillation.  Repeat CT head negative for hemorrhage, Neurology cleared for anticoagulation. Continue new Eliquis, metoprolol   UTI (urinary tract infection) - Completed 5 days Rocephin .    Rheumatoid arthritis - Continue Arava    Mixed hyperlipidemia -Continue Lipitor   CKD 3a -creatinine stable   Hyperthyroidism - TSH normal here. Continue methimazole    Normocytic anemia - Hgb slightly down, overall stable, no bleeding  Scheduled Meds:   apixaban  5 mg Oral BID   atorvastatin   40 mg Per Tube Daily   folic acid   1 mg Per Tube Daily   free water   75 mL Per Tube Q4H   Gerhardt's butt cream   Topical QID   leflunomide   10 mg Per Tube Daily   losartan   100 mg Oral Daily   methimazole   5 mg Per Tube Daily   metoprolol tartrate  25 mg Per Tube BID   multivitamin with minerals  1 tablet Per Tube Daily   Continuous Infusions:  feeding supplement (OSMOLITE 1.2 CAL) Stopped (01/09/24 0000)   PRN Meds:.acetaminophen  **OR** acetaminophen  (TYLENOL ) oral liquid 160 mg/5 mL **OR** acetaminophen , diclofenac Sodium, loperamide, mouth rinse, oxyCODONE  Current Outpatient Medications  Medication Instructions   acetaminophen  (TYLENOL ) 500 mg, Oral, Every 6 hours PRN   aspirin  81 mg, Daily   atorvastatin  (LIPITOR) 20 MG tablet TAKE 1 TABLET(20 MG) BY MOUTH DAILY   fish oil-omega-3 fatty acids 1 g, Daily   folic acid  (FOLVITE ) 1 mg, Daily   hydrochlorothiazide  (HYDRODIURIL ) 12.5 MG tablet TAKE 1 TABLET(12.5 MG) BY MOUTH DAILY   leflunomide  (ARAVA ) 10 mg, Daily   losartan  (COZAAR ) 100 MG tablet TAKE 1 TABLET(100 MG) BY MOUTH DAILY   methimazole  (TAPAZOLE ) 5 mg, Daily   Multiple Vitamins-Calcium  (ONE-A-DAY WOMENS FORMULA PO) 1 tablet, Daily   potassium chloride  (KLOR-CON  M) 10 MEQ tablet TAKE 1 TABLET(10 MEQ) BY MOUTH DAILY WITH FOOD    Diet Orders (From admission, onward)     Start     Ordered   01/09/24 0943  DIET - DYS 1 Room service appropriate? Yes; Fluid consistency: Honey Thick  Diet effective now  Comments: Meds via PEG Full supervision and assist with all PO's  Question Answer Comment  Room service appropriate? Yes   Fluid consistency: Honey Thick      01/09/24 0945            DVT prophylaxis: SCD's Start: 01/01/24 0845 apixaban (ELIQUIS) tablet 5 mg   Lab Results  Component Value Date   PLT 352 01/09/2024      Code Status: Full Code  Family Communication: no family at bedside   Status is:  Inpatient Remains inpatient appropriate because: severity of illness  Level of care: Telemetry Medical  Consultants:  Neurology   Objective: Vitals:   01/09/24 0011 01/09/24 0406 01/09/24 0413 01/09/24 0750  BP: 130/82 (!) 131/91  114/85  Pulse: 93 (!) 53  (!) 58  Resp:    18  Temp: 98.8 F (37.1 C) (!) 97.5 F (36.4 C)  97.7 F (36.5 C)  TempSrc:  Oral    SpO2: 97% 100%  97%  Weight:   62.6 kg   Height:        Intake/Output Summary (Last 24 hours) at 01/09/2024 1059 Last data filed at 01/09/2024 0349 Gross per 24 hour  Intake --  Output 900 ml  Net -900 ml   Wt Readings from Last 3 Encounters:  01/09/24 62.6 kg  08/14/23 60.8 kg  04/23/23 61.1 kg    Examination:  Constitutional: NAD Eyes: no scleral icterus ENMT: Mucous membranes are moist.  Neck: normal, supple Respiratory: clear to auscultation bilaterally, no wheezing, no crackles. Cardiovascular: Regular rate and rhythm, no murmurs / rubs / gallops. No LE edema.  Abdomen: non distended, no tenderness. Bowel sounds positive.  Musculoskeletal: no clubbing / cyanosis.    Data Reviewed: I have independently reviewed following labs and imaging studies   CBC Recent Labs  Lab 01/07/24 0823 01/08/24 0449 01/09/24 0455  WBC 4.3 4.8 5.3  HGB 10.9* 10.6* 11.1*  HCT 35.1* 34.3* 35.5*  PLT 266 313 352  MCV 85.8 86.6 86.6  MCH 26.7 26.8 27.1  MCHC 31.1 30.9 31.3  RDW 13.2 13.1 13.1    Recent Labs  Lab 01/04/24 0445 01/07/24 0823 01/08/24 0449 01/09/24 0455  NA  --  137 137 137  K  --  4.8 4.2 4.8  CL  --  102 102 102  CO2  --  24 28 24   GLUCOSE  --  108* 187* 162*  BUN  --  28* 31* 33*  CREATININE  --  1.15* 1.25* 1.08*  CALCIUM   --  9.1 8.9 8.9  AST  --  41 45*  --   ALT  --  31 29  --   ALKPHOS  --  43 43  --   BILITOT  --  0.9 0.4  --   ALBUMIN  --  2.5* 2.4*  --   MG 1.8 1.9  --   --   INR  --  1.1  --   --      ------------------------------------------------------------------------------------------------------------------ No results for input(s): CHOL, HDL, LDLCALC, TRIG, CHOLHDL, LDLDIRECT in the last 72 hours.  Lab Results  Component Value Date   HGBA1C 6.0 (H) 01/01/2024   ------------------------------------------------------------------------------------------------------------------ No results for input(s): TSH, T4TOTAL, T3FREE, THYROIDAB in the last 72 hours.  Invalid input(s): FREET3  Cardiac Enzymes No results for input(s): CKMB, TROPONINI, MYOGLOBIN in the last 168 hours.  Invalid input(s): CK ------------------------------------------------------------------------------------------------------------------ No results found for: BNP  CBG: Recent Labs  Lab 01/08/24 1607 01/08/24 2019 01/09/24  0014 01/09/24 0407 01/09/24 0749  GLUCAP 161* 177* 167* 187* 128*    No results found for this or any previous visit (from the past 240 hours).   Radiology Studies: No results found.   Nilda Fendt, MD, PhD [facility]  Between 7 am - 7 pm I am available, please contact me via Amion (for emergencies) or Securechat (non urgent messages)  Between 7 pm - 7 am I am not available, please contact night coverage MD/APP via Amion

## 2024-01-09 NOTE — Progress Notes (Signed)
 Physical Therapy Treatment Patient Details Name: Priscilla Thornton MRN: [id number] DOB: January 10, 1946 Today's Date: 01/09/2024   History of Present Illness Pt is a 78 y/o F admitted on 12/31/23 after presenting a well check was called on family as pt had not been seen since Saturday (12/29/23). Pt was found down, found to be aphasic with L hemiparesis, R sided deviated gaze. 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 PMH: hyperthyroidism, RA, HTN, a-fib not on AC    PT Comments  Pt received in supine and demonstrates little verbalizations this session. Pt requires max A to sit to EOB. Pt holds rail with RUE for most of session despite cues. Pt able to sit EOB with CGA-min A for ~10 mins, but demonstrates increasing forward lean with increased fatigue. Pt unable to follow cues for exercises while sitting EOB, so focus on balance and facilitating WB through LUE. Pt continues to benefit from PT services to progress toward functional mobility goals.     If plan is discharge home, recommend the following: Two people to help with walking and/or transfers;Two people to help with bathing/dressing/bathroom   Can travel by private vehicle     No  Equipment Recommendations  Hoyer lift;Wheelchair (measurements PT);Hospital bed;Wheelchair cushion (measurements PT)    Recommendations for Other Services       Precautions / Restrictions Precautions Precautions: Fall Recall of Precautions/Restrictions: Impaired Precaution/Restrictions Comments: L hemi, PEG Restrictions Weight Bearing Restrictions Per Provider Order: No     Mobility  Bed Mobility Overal bed mobility: Needs Assistance Bed Mobility: Supine to Sit, Sit to Supine     Supine to sit: Max assist, HOB elevated, Used rails Sit to supine: Total assist   General bed mobility comments: Max A to sit to EOB with pt holding R rail, but limited initiation. Total A to return to supine with decreased command  following    Transfers                   General transfer comment: deferred    Ambulation/Gait                   Stairs             Wheelchair Mobility     Tilt Bed    Modified Rankin (Stroke Patients Only) Modified Rankin (Stroke Patients Only) Pre-Morbid Rankin Score: No symptoms Modified Rankin: Severe disability     Balance Overall balance assessment: Needs assistance Sitting-balance support: Feet supported, Single extremity supported, No upper extremity supported Sitting balance-Leahy Scale: Poor Sitting balance - Comments: sitting EOB with min-CGA. progressed forward lean with increased fatigue                                    Communication Communication Communication: Impaired Factors Affecting Communication: Difficulty expressing self  Cognition Arousal: Lethargic Behavior During Therapy: Flat affect   PT - Cognitive impairments: Difficult to assess Difficult to assess due to: Impaired communication                       Following commands: Impaired Following commands impaired: Follows one step commands with increased time, Follows one step commands inconsistently    Cueing Cueing Techniques: Verbal cues, Visual cues, Gestural cues, Tactile cues  Exercises      General Comments        Pertinent Vitals/Pain  Pain Assessment Pain Assessment: Faces Faces Pain Scale: Hurts little more Pain Location: PEG site, back Pain Descriptors / Indicators: Discomfort, Grimacing Pain Intervention(s): Monitored during session, Limited activity within patient's tolerance, Repositioned     PT Goals (current goals can now be found in the care plan section) Acute Rehab PT Goals PT Goal Formulation: Patient unable to participate in goal setting Time For Goal Achievement: 01/15/24 Progress towards PT goals: Progressing toward goals    Frequency    Min 2X/week       AM-PAC PT 6 Clicks Mobility   Outcome  Measure  Help needed turning from your back to your side while in a flat bed without using bedrails?: A Lot Help needed moving from lying on your back to sitting on the side of a flat bed without using bedrails?: A Lot Help needed moving to and from a bed to a chair (including a wheelchair)?: Total Help needed standing up from a chair using your arms (e.g., wheelchair or bedside chair)?: Total Help needed to walk in hospital room?: Total Help needed climbing 3-5 steps with a railing? : Total 6 Click Score: 8    End of Session   Activity Tolerance: Patient limited by fatigue Patient left: in bed;with call bell/phone within reach;with family/visitor present Nurse Communication: Mobility status PT Visit Diagnosis: Muscle weakness (generalized) (M62.81);Other abnormalities of gait and mobility (R26.89);Difficulty in walking, not elsewhere classified (R26.2);Hemiplegia and hemiparesis;Unsteadiness on feet (R26.81) Hemiplegia - Right/Left: Left Hemiplegia - caused by: Cerebral infarction     Time: 8742-8676 PT Time Calculation (min) (ACUTE ONLY): 26 min  Charges:    $Therapeutic Activity: 23-37 mins PT General Charges $$ ACUTE PT VISIT: 1 Visit                     Darryle George, PTA Acute Rehabilitation Services Secure Chat Preferred  [phone]    Darryle George 01/09/2024, 3:23 PM

## 2024-01-09 NOTE — Plan of Care (Signed)

## 2024-01-10 DIAGNOSIS — I63411 Cerebral infarction due to embolism of right middle cerebral artery: Secondary | ICD-10-CM | POA: Diagnosis not present

## 2024-01-10 LAB — GLUCOSE, CAPILLARY
Glucose-Capillary: 113 mg/dL — ABNORMAL HIGH (ref 70–99)
Glucose-Capillary: 128 mg/dL — ABNORMAL HIGH (ref 70–99)
Glucose-Capillary: 128 mg/dL — ABNORMAL HIGH (ref 70–99)
Glucose-Capillary: 133 mg/dL — ABNORMAL HIGH (ref 70–99)
Glucose-Capillary: 144 mg/dL — ABNORMAL HIGH (ref 70–99)
Glucose-Capillary: 149 mg/dL — ABNORMAL HIGH (ref 70–99)
Glucose-Capillary: 186 mg/dL — ABNORMAL HIGH (ref 70–99)

## 2024-01-10 MED ORDER — APIXABAN 5 MG PO TABS: 5.0000 mg | ORAL_TABLET | Freq: Two times a day (BID) | ORAL | Status: DC

## 2024-01-10 MED ORDER — OXYCODONE HCL 5 MG PO TABS: 5.0000 mg | ORAL_TABLET | Freq: Three times a day (TID) | ORAL | 0 refills | Status: DC | PRN

## 2024-01-10 MED ORDER — FREE WATER: 75.0000 mL | Status: DC

## 2024-01-10 MED ORDER — ATORVASTATIN CALCIUM 40 MG PO TABS: 40.0000 mg | ORAL_TABLET | Freq: Every day | ORAL | Status: DC

## 2024-01-10 MED ORDER — METOPROLOL TARTRATE 25 MG PO TABS: 25.0000 mg | ORAL_TABLET | Freq: Two times a day (BID) | ORAL | Status: DC

## 2024-01-10 MED ORDER — OSMOLITE 1.2 CAL PO LIQD: 1000.0000 mL | ORAL | Status: DC

## 2024-01-10 NOTE — Progress Notes (Signed)
 Speech Language Pathology Treatment: Dysphagia  Patient Details Name: Priscilla Thornton MRN: [id number] DOB: 25-Jan-1946 Today's Date: 01/10/2024 Time: 8657-8642 SLP Time Calculation (min) (ACUTE ONLY): 15 min  Assessment / Plan / Recommendation Clinical Impression  Pt was seen for dysphagia tx.  She was lethargic - could arouse for brief periods and exchange social pleasantries, then would fall back to sleep.  Followed commands after delay. Oral cavity clean. She accepted sips of honey thick liquid from a spoon, then took sips from a cup with physical support to prevent spills. Needed intermittent verbal cues and tactile cues under jaw to stimulate tongue movement and help initiate a swallow. Oral holding also notable.  She responded to cues after delay. There were no overt s/s of aspiration.    Recommend she continue to receive dysphagia 1/honey thick liquid tray.  She will benefit from opportunities to eat - even if she takes only a few bites/sips from her trays.  SLP will continue to work toward swallowing/cognitive goals.    HPI HPI: Patient is a 78 y.o. female with PMH: RA, HTN, a-fib not on anticoagulant. She works full time at [employer] as a Scientist, physiological. She was found down on 12/31/23 during a well check after family had not heard from her since Saturday (6/21). 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 MRI Large acute right middle cerebral artery territory infarct,  Petechial hemorrhage present  within portions of the infarction territory in the right basal  ganglia region. MBS 6/29 - DIGEST score of 3; PEG 6/30.      SLP Plan  Continue with current plan of care          Recommendations  Diet recommendations: Dysphagia 1 (puree);Honey-thick liquid Liquids provided via: Teaspoon Medication Administration: Via alternative means Supervision: Trained caregiver to feed patient;Staff to assist with self feeding;Full supervision/cueing  for compensatory strategies Compensations: Slow rate;Small sips/bites Postural Changes and/or Swallow Maneuvers: Seated upright 90 degrees                  Staff/trained caregiver to provide oral care;Oral care BID   Frequent or constant Supervision/Assistance Dysphagia, oropharyngeal phase (R13.12)     Continue with current plan of care    Elecia Serafin L. Vona, MA CCC/SLP Clinical Specialist - Acute Care SLP Acute Rehabilitation Services Office number [phone]  Vona Palma Laurice  01/10/2024, 2:01 PM

## 2024-01-10 NOTE — TOC Progression Note (Addendum)
 Transition of Care [facility]) - Progression Note    Patient Details  Name: Priscilla Thornton MRN: [id number] Date of Birth: [birth year]  [facility]) CM/SW Contact  Priscilla Thornton, Priscilla Thornton Phone Number: 01/10/2024, 2:45 PM  Clinical Narrative:   CSW coordinated with [facility] to get information for authorization request and answer additional questions for them. CSW contacted C[facility] to request insurance authorization, and patient has been approved. [facility] will have a bed available for the patient tomorrow. CSW contacted [facility] to schedule transportation, they can pick the patient up at 10 am tomorrow. CSW updated patient's daughter, Priscilla Thornton, and she is very Adult nurse. CSW updated MD and RN, discharge will need to be completed early in the morning for [facility] to get medications from pharmacy. CSW to follow.  UPDATE: CSW contacted by [facility] to discuss tube feeding, they did not realize that patient was on tube feeding, they will have to order and it won't be in until next week. [facility] can admit the patient on Monday. CSW updated daughter, contacted [facility] to reschedule transportation, and updated medical team. CSW to follow.    Expected Discharge Plan: Skilled Nursing Facility Barriers to Discharge: English as a second language teacher, Continued Medical Work up  Expected Discharge Plan and Services     Post Acute Care Choice: Skilled Nursing Facility Living arrangements for the past 2 months: Single Family Home                                       Social Determinants of Health (SDOH) Interventions SDOH Screenings   Food Insecurity: Patient Unable To Answer (01/02/2024)  Housing: Unknown (01/02/2024)  Transportation Needs: Patient Unable To Answer (01/02/2024)  Utilities: Patient Unable To Answer (01/02/2024)  Depression (PHQ2-9): Low Risk  (08/14/2023)  Financial Resource Strain: Low Risk  (01/25/2022)  Physical Activity: Inactive (01/25/2022)  Social Connections:  Patient Unable To Answer (01/02/2024)  Stress: No Stress Concern Present (01/25/2022)  Tobacco Use: Medium Risk (01/04/2024)    Readmission Risk Interventions     No data to display

## 2024-01-10 NOTE — TOC Progression Note (Signed)
 Transition of Care [facility]) - Progression Note    Patient Details  Name: Priscilla Thornton MRN: [id number] Date of Birth: [birth year]  Transition of Care [facility]) CM/SW Contact  [name], [place] Phone Number: 01/10/2024, 2:44 PM  Clinical Narrative:   CSW contacted Keo with [facility] to ask about referral. Referral is still pending director of nursing review. Keo contacted CSW back that she would have someone look at it, and verified patient's information. Keo called back that they can offer the patient a bed for rehab. Family would like to accept. CSW to start insurance authorization for SNF.    Expected Discharge Plan: Skilled Nursing Facility Barriers to Discharge: English as a second language teacher, Continued Medical Work up  Expected Discharge Plan and Services     Post Acute Care Choice: Skilled Nursing Facility Living arrangements for the past 2 months: Single Family Home                                       Social Determinants of Health (SDOH) Interventions SDOH Screenings   Food Insecurity: Patient Unable To Answer (01/02/2024)  Housing: Unknown (01/02/2024)  Transportation Needs: Patient Unable To Answer (01/02/2024)  Utilities: Patient Unable To Answer (01/02/2024)  Depression (PHQ2-9): Low Risk  (08/14/2023)  Financial Resource Strain: Low Risk  (01/25/2022)  Physical Activity: Inactive (01/25/2022)  Social Connections: Patient Unable To Answer (01/02/2024)  Stress: No Stress Concern Present (01/25/2022)  Tobacco Use: Medium Risk (01/04/2024)    Readmission Risk Interventions     No data to display

## 2024-01-10 NOTE — Plan of Care (Signed)

## 2024-01-10 NOTE — Discharge Summary (Incomplete)
 Physician Discharge Summary  Priscilla Thornton FMW:[id number] DOB: Aug 21, 1945 DOA: 12/31/2023  PCP: Georgina Speaks, FNP  Admit date: 12/31/2023 Discharge date: 01/10/2024  Admitted From: home Disposition:  SNF  Recommendations for Outpatient Follow-up:  Follow up with PCP in 1-2 weeks  Home Health: none Equipment/Devices: none  Discharge Condition: stable CODE STATUS: Full code Diet Orders (From admission, onward)     Start     Ordered   01/09/24 0943  DIET - DYS 1 Room service appropriate? Yes; Fluid consistency: Honey Thick  Diet effective now       Comments: Meds via PEG Full supervision and assist with all PO's  Question Answer Comment  Room service appropriate? Yes   Fluid consistency: Honey Thick      01/09/24 0945            Brief hospital course: 78 y.o. F with RA on Arava , hyperthyroidism on methimazole , HTN, CKD IIIa baseline 1.1 who presented with large MCA stroke after being found down.   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 Noninvasive CT angiography showed discrete right MCA occlusion. Follow-up CT showed no development of intraparenchymal hemorrhage. Echocardiogram showed normal EF, carotid imaging no high-grade stenosis. LDL 85, Lipitor increased. Started on DOAC as per neurology. Palliative care consulted, family wished for no limitation of care, even at the end of life. PEG tube placed on 6/30 by IR.   Active problems Dysphagia -Continue tube feed, free water , outpatient  speech Persistent atrial fibrillation (HCC) - Diagnosed last fall, patient missed follow up with Cardiology.  On admission, still in atrial fibrillation.  Repeat CT head negative for hemorrhage, Neurology cleared for anticoagulation. Continue new Eliquis, metoprolol UTI (urinary tract infection) - Completed 5 days Rocephin . Rheumatoid arthritis - Continue Arava  Mixed hyperlipidemia -Continue Lipitor CKD 3a -creatinine stable Hyperthyroidism - TSH normal here. Continue methimazole  Normocytic anemia - Hgb slightly down, overall stable, no bleeding  Sepsis ruled out   Discharge Instructions  Discharge Instructions     Ambulatory referral to Neurology   Complete by: As directed    Follow up with stroke clinic NP at [facility] in about 4-6 weeks. Thanks.      Allergies as of 01/10/2024   No Known Allergies      Medication List     STOP taking these medications    aspirin  81 MG tablet   hydrochlorothiazide  12.5 MG tablet Commonly known as: HYDRODIURIL    potassium chloride  10 MEQ tablet Commonly known as: KLOR-CON  M       TAKE these medications    acetaminophen  500 MG tablet Commonly known as: TYLENOL  Take 500 mg by mouth every 6 (six) hours as needed for moderate pain (pain score 4-6).   apixaban 5 MG Tabs tablet Commonly known as: ELIQUIS Take 1 tablet (5 mg total) by mouth 2 (two) times daily.   atorvastatin  40 MG tablet Commonly known as: LIPITOR Place 1 tablet (40 mg total) into feeding tube daily. Start taking on: January 11, 2024 What changed:  medication strength See the new instructions.   feeding supplement (OSMOLITE  1.2 CAL) Liqd Place 1,000 mLs into feeding tube continuous.   fish oil-omega-3 fatty acids 1000 MG capsule Take 1 g by mouth daily.   folic acid  1 MG tablet Commonly known as: FOLVITE  Take 1 mg by mouth daily.   free water  Soln Place 75 mLs into feeding tube every 4 (four) hours.   leflunomide  10 MG tablet Commonly known as: ARAVA  Take 10  mg by mouth daily.   losartan  100 MG tablet Commonly known as: COZAAR  TAKE 1 TABLET(100 MG) BY MOUTH DAILY   methimazole  5 MG tablet Commonly known as: TAPAZOLE  Take 5 mg by mouth daily.   metoprolol tartrate 25 MG tablet Commonly known as: LOPRESSOR Place 1 tablet (25 mg total) into feeding tube 2 (two) times daily.   ONE-A-DAY WOMENS FORMULA PO Take 1 tablet by mouth daily.   oxyCODONE 5 MG immediate release tablet Commonly known as: Oxy IR/ROXICODONE Take 1 tablet (5 mg total) by mouth every 8 (eight) hours as needed for moderate pain (pain score 4-6) or severe pain (pain score 7-10).        Follow-up Information     [facility]. Schedule an appointment as soon as possible for a visit in 1 month(s).   Specialty: Neurology Why: stroke clinic Contact information: [address] [place]  [address] [phone]               Consultations: Neurology   Procedures/Studies:  IR GASTROSTOMY TUBE MOD SED Result Date: 01/07/2024 CLINICAL DATA:  Cerebral infarction and need for percutaneous gastrostomy tube for nutrition. EXAM: PERCUTANEOUS GASTROSTOMY TUBE PLACEMENT ANESTHESIA/SEDATION: Moderate (conscious) sedation was employed during this procedure. A total of Versed 1.0 mg and Fentanyl 50 mcg was administered intravenously. Moderate Sedation Time: 10 minutes. The patient's level of consciousness and vital signs were monitored continuously by radiology nursing throughout the procedure under my direct supervision. CONTRAST:  15mL OMNIPAQUE  IOHEXOL  300 MG/ML  SOLN MEDICATIONS: 2 g IV Ancef. IV antibiotic was administered in an appropriate time interval prior to needle puncture of the skin. 0.5 mg IV glucagon during the procedure. FLUOROSCOPY: Radiation Exposure Index: 18.7 mGy Kerma. PROCEDURE: The procedure, risks, benefits, and alternatives were explained to the patient's son. Questions regarding the procedure were  encouraged and answered. The patient's son understands and consents to the procedure. A timeout was performed prior to initiating the procedure. A 5-French catheter was then advanced through the patient's mouth under fluoroscopy into the esophagus and to the level of the stomach. This catheter was used to insufflate the stomach with air under fluoroscopy. The abdominal wall was prepped with chlorhexidine in a sterile fashion, and a sterile drape was applied covering the operative field. A sterile gown and sterile gloves were used for the procedure. Local anesthesia was provided with 1% Lidocaine. A skin incision was made in the upper abdominal wall. Under fluoroscopy, an 18 gauge trocar needle was advanced into the stomach. Contrast injection was performed to confirm intraluminal position of the needle tip. A single T tack was then deployed in the lumen of the stomach. This was brought up to tension at the skin surface. Over a guidewire, a 9-French sheath was advanced into the lumen of the stomach. The wire was left in place as a safety wire. A loop snare device from a percutaneous gastrostomy kit was then advanced into the stomach. A floppy guide wire was advanced through the orogastric catheter under fluoroscopy in the stomach. The loop snare advanced through the  percutaneous gastric access was used to snare the guide wire. This allowed withdrawal of the loop snare out of the patient's mouth by retraction of the orogastric catheter and wire. A 20-French bumper retention gastrostomy tube was looped around the snare device. It was then pulled back through the patient's mouth. The retention bumper was brought up to the anterior gastric wall. The T tack suture was cut at the skin. The exiting gastrostomy tube was cut to appropriate length and a feeding adapter applied. The catheter was injected with contrast material to confirm position and a fluoroscopic spot image saved. The tube was then flushed with saline. A  dressing was applied over the gastrostomy exit site. COMPLICATIONS: None. FINDINGS: The stomach distended well with air allowing safe placement of the gastrostomy tube. After placement, the tip of the gastrostomy tube lies in the body of the stomach. IMPRESSION: Percutaneous gastrostomy with placement of a 20-French bumper retention tube in the body of the stomach. This tube can be used for percutaneous feeds beginning in 24 hours after placement. Electronically Signed   By: Marcey Moan M.D.   On: 01/07/2024 13:33   CT ABDOMEN WO CONTRAST Result Date: 01/06/2024 CLINICAL DATA:  Post stroke, preop planning for gastrostomy placement EXAM: CT ABDOMEN WITHOUT CONTRAST TECHNIQUE: Multidetector CT imaging of the abdomen was performed following the standard protocol without IV contrast. RADIATION DOSE REDUCTION: This exam was performed according to the departmental dose-optimization program which includes automated exposure control, adjustment of the mA and/or kV according to patient size and/or use of iterative reconstruction technique. COMPARISON:  None Available. FINDINGS: Lower chest: No pleural or pericardial effusion. Dependent atelectasis in the visualized lung bases. Hepatobiliary: Hyperdense nondilated gallbladder presumably vicarious contrast excretion. No focal liver lesion or biliary ductal dilatation. Pancreas: Unremarkable. No pancreatic ductal dilatation or surrounding inflammatory changes. Spleen: Normal in size without focal abnormality. Adrenals/Urinary Tract: No adrenal mass. No urolithiasis or hydronephrosis. 4.2 cm right lower pole renal cortical cyst; no follow-up indicated. Stomach/Bowel: Small bowel decompressed. Feeding tube to the gastric antrum. There is safe percutaneous window for gastrostomy placement. Visualized small bowel and colon are decompressed, unremarkable. Vascular/Lymphatic: Scattered calcified aortic plaque without AAA. No abdominal or retroperitoneal adenopathy. Suspect  circumaortic left renal vein. Other: No ascites.  No free air. Musculoskeletal: Mild spondylitic changes in the lower thoracic and lower lumbar spine. No acute findings. IMPRESSION: 1. Feeding tube to the gastric antrum. There is safe percutaneous window for gastrostomy placement. 2.  Aortic Atherosclerosis (ICD10-I70.0). Electronically Signed   By: JONETTA Faes M.D.   On: 01/06/2024 15:53   DG Swallowing Func-Speech Pathology Result Date: 01/06/2024 Table formatting from the original result was not included. Images from the original result were not included. Modified Barium Swallow Study Patient Details Name: ZORANA BROCKWELL MRN: [id number] Date of Birth: 03-24-1946 Today's Date: 01/06/2024 HPI/PMH: HPI: Patient is a 77 y.o. female with PMH: RA, HTN, a-fib not on anticoagulant. She works full time at [employer] as a Scientist, physiological. She was found down on 12/31/23 during a well check after family had not heard from her since Saturday (6/21). 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 Clinical Impression: Pt presents with a moderate oropharyngeal dysphagia c/b poor bolus awareness, reduced lingual strength and coordination, delayed swallow initiation, incomplete laryngeal closure and  diminishe sensation.  These deficits resulted in audible aspiration of thin liquid which was not cleared by reflexive cough, and silent aspiration of nectar thick liquids.  Cued cough initially very weak.  With encouragemnt, pt was able to expel aspirated nectar thick liquid from the trachea to the laryngeal vestibute, but did not attain full clearance from nectar thick liquid from above the vocal folds.  There was no penetration or aspiration of honey thick liquid or puree consistencies.  There was however, prolonged  oral holding and inattention to bolus.  Solid texture bolus trial was deferred 2/2 difficulty with oral manipulation and transit of puree bolus.  Esophageal sweep was not completed.  There was trace-minimal retention of contrast intermittently around suspected CP Bar (see image below).  Suspect pt would have a difficult time meeting nutritional needs orally.  Pt planned for PEG tube placement tomorrow.  It would be reasonable to proceed with PEG for nutritional support while continuing to work swallowing safety and efficiency.  Consider initiating puree diet with honey thick liquids after PEG placement tomorrow.  SLP will continue to follow for above noted deficits. DIGEST Swallow Severity Rating*  Safety: 3  Efficiency: 0  Overall Pharyngeal Swallow Severity: 3 1: mild; 2: moderate; 3: severe; 4: profound *The Dynamic Imaging Grade of Swallowing Toxicity is standardized for the head and neck cancer population, however, demonstrates promising clinical applications across populations to standardize the clinical rating of pharyngeal swallow safety and severity. Suspected CP Bar: Factors that may increase risk of adverse event in presence of aspiration Noe & Lianne 2021): Factors that may increase risk of adverse event in presence of aspiration Noe & Lianne 2021): Presence of tubes (ETT, trach, NG, etc.); Reduced cognitive function Recommendations/Plan: Swallowing Evaluation Recommendations Swallowing Evaluation Recommendations Recommendations: PO diet PO Diet Recommendation: Dysphagia 1 (Pureed); Moderately thick liquids (Level 3, honey thick) (following PEG placement planned for 6/30) Medication Administration: Via alternative means Supervision: Full assist for feeding Swallowing strategies  : Small bites/sips; Slow rate; Check for pocketing or oral holding Postural changes: Position pt fully upright for meals Oral care recommendations: Oral care BID (2x/day) Treatment Plan Treatment Plan Treatment  recommendations: Therapy as outlined in treatment plan below Follow-up recommendations: Skilled nursing-short term rehab (<3 hours/day) Functional status assessment: Patient has had a recent decline in their functional status and demonstrates the ability to make significant improvements in function in a reasonable and predictable amount of time. Treatment frequency: Min 2x/week Treatment duration: 2 weeks Interventions: Diet toleration management by SLP; Trials of upgraded texture/liquids; Patient/family education (Consider trial of exercise to improve swallow function) Recommendations Recommendations for follow up therapy are one component of a multi-disciplinary discharge planning process, led by the attending physician.  Recommendations may be updated based on patient status, additional functional criteria and insurance authorization. Assessment: Orofacial Exam: Orofacial Exam Oral Cavity: Oral Hygiene: WFL Oral Cavity - Dentition: Edentulous Orofacial Anatomy: WFL Oral Motor/Sensory Function: -- (See BSE) Anatomy: Anatomy: Prominent cricopharyngeus Boluses Administered: Boluses Administered Boluses Administered: Thin liquids (Level 0); Mildly thick liquids (Level 2, nectar thick); Moderately thick liquids (Level 3, honey thick); Puree  Oral Impairment Domain: Oral Impairment Domain Lip Closure: Escape beyond mid-chin Tongue control during bolus hold: Not tested Bolus preparation/mastication: Slow prolonged chewing/mashing with complete recollection Bolus transport/lingual motion: Repetitive/disorganized tongue motion; Delayed initiation of tongue motion (oral holding) Oral residue: Trace residue lining oral structures Location of oral residue : Tongue Initiation of pharyngeal swallow : Pyriform sinuses  Pharyngeal Impairment Domain: Pharyngeal Impairment Domain  Soft palate elevation: No bolus between soft palate (SP)/pharyngeal wall (PW) Laryngeal elevation: Partial superior movement of thyroid  cartilage/partial  approximation of arytenoids to epiglottic petiole Anterior hyoid excursion: Partial anterior movement Epiglottic movement: Complete inversion Laryngeal vestibule closure: Incomplete, narrow column air/contrast in laryngeal vestibule Pharyngeal stripping wave : Present - complete Pharyngeal contraction (A/P view only): N/A Pharyngoesophageal segment opening: Partial distention/partial duration, partial obstruction of flow Tongue base retraction: No contrast between tongue base and posterior pharyngeal wall (PPW) Pharyngeal residue: Complete pharyngeal clearance Location of pharyngeal residue: N/A  Esophageal Impairment Domain: Esophageal Impairment Domain Esophageal clearance upright position: -- (not assessed) Pill: Pill Consistency administered: -- (Not assessed) Penetration/Aspiration Scale Score: Penetration/Aspiration Scale Score 1.  Material does not enter airway: Moderately thick liquids (Level 3, honey thick); Puree 7.  Material enters airway, passes BELOW cords and not ejected out despite cough attempt by patient: Thin liquids (Level 0) 8.  Material enters airway, passes BELOW cords without attempt by patient to eject out (silent aspiration) : Mildly thick liquids (Level 2, nectar thick) Compensatory Strategies: Compensatory Strategies Compensatory strategies: Yes Other(comment): Ineffective (Cued cough)   General Information: Caregiver present: No  Diet Prior to this Study: NPO   No data recorded  No data recorded  No data recorded  No data recorded Behavior/Cognition: Alert; Cooperative; Requires cueing No data recorded Baseline vocal quality/speech: Normal No data recorded No data recorded Exam Limitations: No limitations Goal Planning: Prognosis for improved oropharyngeal function: Good No data recorded No data recorded Patient/Family Stated Goal: not stated Consulted and agree with results and recommendations: Physician Pain: Pain Assessment Breathing: 0 Negative Vocalization: 0 Facial Expression: 0  Body Language: 0 Consolability: 0 PAINAD Score: 0 End of Session: Start Time:SLP Start Time (ACUTE ONLY): 1243 Stop Time: SLP Stop Time (ACUTE ONLY): 1255 Time Calculation:SLP Time Calculation (min) (ACUTE ONLY): 12 min Charges: SLP Evaluations $ SLP Speech Visit: 1 Visit SLP Evaluations $MBS Swallow: 1 Procedure $Swallowing Treatment: 1 Procedure SLP visit diagnosis: SLP Visit Diagnosis: Dysphagia, oropharyngeal phase (R13.12) Past Medical History: Past Medical History: Diagnosis Date  Arthritis   Hyperlipidemia   Hypertension  Past Surgical History: No past surgical history on file. Anette FORBES Grippe, MA, CCC-SLP Acute Rehabilitation Services Office: [phone] 01/06/2024, 2:42 PM  CT HEAD WO CONTRAST ( ) Result Date: 01/04/2024 EXAM: CT HEAD WITHOUT 01/04/2024 06:06:40 AM TECHNIQUE: CT of the head was performed without the administration of intravenous contrast. Automated exposure control, iterative reconstruction, and/or weight based adjustment of the mA/kV was utilized to reduce the radiation dose to as low as reasonably achievable. COMPARISON: CT angio head and neck 12/31/2023 and MR head without contrast 01/01/2024. CLINICAL HISTORY: Stroke, follow up. FINDINGS: BRAIN AND VENTRICLES: Expected evolution of a large right MCA territory infarct is noted. The infarct involves the right lentiform nucleus and internal capsule. Progressive effacement of the sulci is present. Mass effect is again noted on the right lateral ventricle. Midline shift measures 1 to 2 mm. No acute hemorrhage is present. No new or progressive infarct is present. ORBITS: Bilateral lens replacements are noted. The globes and orbits are otherwise within normal limits. SINUSES AND MASTOIDS: A polyp or mucous retention cyst is present in the left maxillary sinus. SOFT TISSUES AND SKULL: No acute skull fracture. No acute soft tissue abnormality. IMPRESSION: 1. Expected evolution of a large right MCA territory infarct involving the right  lentiform nucleus and internal capsule, with progressive effacement of the sulci, mass effect on the right lateral ventricle, and midline shift measuring  1 to 2 mm. 2. No acute hemorrhage or new/progressive infarct. Electronically signed by: Lonni Necessary MD 01/04/2024 06:36 AM EDT RP Workstation: [id number]   ECHOCARDIOGRAM COMPLETE Result Date: 01/01/2024    ECHOCARDIOGRAM REPORT   Patient Name:   Priscilla Thornton Date of Exam: 01/01/2024 Medical Rec #:  [id number]        Height:       65.0 in Accession #:    [id number]       Weight:       133.4 lb Date of Birth:  Jun 03, 1946        BSA:          1.665 m Patient Age:    78 years         BP:           147/104 mmHg Patient Gender: F                HR:           83 bpm. Exam Location:  Inpatient Procedure: 2D Echo, Cardiac Doppler and Color Doppler (Both Spectral and Color            Flow Doppler were utilized during procedure). Indications:    Stroke  History:        Patient has no prior history of Echocardiogram examinations.                 Risk Factors:Hypertension.  Sonographer:    Jayson Gaskins Referring Phys: [id number] ANASTASSIA DOUTOVA IMPRESSIONS  1. Left ventricular ejection fraction, by estimation, is 55 to 60%. The left ventricle has normal function. The left ventricle has no regional wall motion abnormalities. Left ventricular diastolic parameters are indeterminate.  2. Right ventricular systolic function is normal. The right ventricular size is not well visualized. Mildly increased right ventricular wall thickness.  3. The mitral valve was not well visualized. No evidence of mitral valve regurgitation. No evidence of mitral stenosis.  4. Tricuspid valve regurgitation is mild to moderate.  5. The aortic valve was not well visualized. Aortic valve regurgitation is not visualized. No aortic stenosis is present. Comparison(s): No prior Echocardiogram. FINDINGS  Left Ventricle: Left ventricular ejection fraction, by estimation, is 55 to 60%. The left  ventricle has normal function. The left ventricle has no regional wall motion abnormalities. The left ventricular internal cavity size was normal in size. There is  no left ventricular hypertrophy. Left ventricular diastolic parameters are indeterminate. Right Ventricle: The right ventricular size is not well visualized. Mildly increased right ventricular wall thickness. Right ventricular systolic function is normal. Left Atrium: Left atrial size was normal in size. Right Atrium: Right atrial size was normal in size. Pericardium: There is no evidence of pericardial effusion. Mitral Valve: The mitral valve was not well visualized. No evidence of mitral valve regurgitation. No evidence of mitral valve stenosis. Tricuspid Valve: Eccentric jet. The tricuspid valve is normal in structure. Tricuspid valve regurgitation is mild to moderate. No evidence of tricuspid stenosis. Aortic Valve: The aortic valve was not well visualized. Aortic valve regurgitation is not visualized. No aortic stenosis is present. Aortic valve peak gradient measures 3.0 mmHg. Pulmonic Valve: The pulmonic valve was not well visualized. Pulmonic valve regurgitation is not visualized. No evidence of pulmonic stenosis. Aorta: The aortic root is normal in size and structure and the ascending aorta was not well visualized. IAS/Shunts: The interatrial septum was not well visualized.  LEFT VENTRICLE PLAX 2D LVIDd:  4.10 cm LVIDs:         3.60 cm LV PW:         0.70 cm LV IVS:        0.90 cm LVOT diam:     1.80 cm LV SV:         41 LV SV Index:   25 LVOT Area:     2.54 cm  RIGHT VENTRICLE RV S prime:     11.40 cm/s LEFT ATRIUM           Index        RIGHT ATRIUM           Index LA Vol (A2C): 21.1 ml 12.67 ml/m  RA Area:     13.80 cm LA Vol (A4C): 46.4 ml 27.86 ml/m  RA Volume:   34.20 ml  20.54 ml/m  AORTIC VALVE AV Area (Vmax): 2.54 cm AV Vmax:        87.00 cm/s AV Peak Grad:   3.0 mmHg LVOT Vmax:      87.00 cm/s LVOT Vmean:     63.100 cm/s  LVOT VTI:       0.161 m  AORTA Ao Root diam: 2.50 cm MITRAL VALVE MV Area (PHT): 4.06 cm    SHUNTS MV Decel Time: 187 msec    Systemic VTI:  0.16 m MV E velocity: 81.80 cm/s  Systemic Diam: 1.80 cm Stanly Leavens MD Electronically signed by Stanly Leavens MD Signature Date/Time: 01/01/2024/3:26:51 PM    Final    MR BRAIN WO CONTRAST Result Date: 01/01/2024 CLINICAL DATA:  Provided history: Stroke, follow-up. EXAM: MRI HEAD WITHOUT CONTRAST TECHNIQUE: Multiplanar, multiecho pulse sequences of the brain and surrounding structures were obtained without intravenous contrast. COMPARISON:  Non-contrast head CT and CT angiogram head/neck 12/31/2023. FINDINGS: Brain: Large acute right middle cerebral artery territory infarct again demonstrated (affecting the cortical/subcortical frontal lobe/insula, temporal lobe, parietal lobe and lateral occipital lobe, as well as basal ganglia region). Associated mass effect with partial effacement of the right lateral ventricle. No midline shift. Petechial hemorrhage within portions of the infarction territory in the right basal ganglia region. Mild multifocal T2 FLAIR hyperintense signal abnormality elsewhere within the cerebral white matter, nonspecific but compatible chronic small vessel ischemic disease. 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 No evidence of an intracranial mass. No extra-axial fluid collection. Vascular: Please see CTA head/neck performed 12/31/2023. Skull and upper cervical spine: No focal worrisome marrow lesion. Sinuses/Orbits: No mass or acute finding within the imaged orbits. Prior bilateral ocular lens replacement. 15 mm left maxillary sinus mucous retention cyst. IMPRESSION: 1. 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 As before, there is associated mass effect with partial effacement the right lateral ventricle. No midline shift.  Petechial hemorrhage present within portions of the infarction territory in the right basal ganglia region. 2. Background mild cerebral white matter chronic small vessel ischemic disease. 3. 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 4. 15 mm left maxillary sinus mucous retention cyst. Electronically Signed   By: Rockey Childs D.O.   On: 01/01/2024 11:18   CT ANGIO HEAD NECK W WO CM Addendum Date: 12/31/2023 ADDENDUM REPORT: 12/31/2023 20:46 ADDENDUM: Reference for recommendation in CTA neck impression #4: J Am Coll Radiol. 2015 Feb;12(2): 143-50. Electronically Signed   By: Rockey Childs D.O.   On: 12/31/2023 20:46   Result Date: 12/31/2023 CLINICAL DATA:  Neuro deficit,  acute, stroke suspected. Left-sided flaccid. Weakness. EXAM: CT ANGIOGRAPHY HEAD AND NECK WITH AND WITHOUT CONTRAST TECHNIQUE: Multidetector CT imaging of the head and neck was performed using the standard protocol during bolus administration of intravenous contrast. Multiplanar CT image reconstructions and MIPs were obtained to evaluate the vascular anatomy. Carotid stenosis measurements (when applicable) are obtained utilizing NASCET criteria, using the distal internal carotid diameter as the denominator. RADIATION DOSE REDUCTION: This exam was performed according to the departmental dose-optimization program which includes automated exposure control, adjustment of the mA and/or kV according to patient size and/or use of iterative reconstruction technique. CONTRAST:  75mL OMNIPAQUE  IOHEXOL  350 MG/ML SOLN COMPARISON:  Noncontrast head CT 12/22/2005. FINDINGS: CT HEAD FINDINGS Brain: Large acute right MCA territory infarct affecting the right frontal lobe/insula, temporal lobe, parietal lobe and occipital lobe, as well as the right basal ganglia region. Mild petechial hemorrhage questioned within portions of the infarction territory. Mild mass effect with partial effacement the right lateral ventricle. No midline shift. Small  infarct within the left cerebellar hemisphere, new from the prior head CT of 12/22/2005 but otherwise age-indeterminate. No extra-axial fluid collection. No evidence of an intracranial mass. Vascular: No hyperdense vessel.  Atherosclerotic calcifications. Skull: No calvarial fracture or aggressive osseous lesion. Sinuses/Orbits: No orbital mass or acute orbital finding. 14 mm mucous retention cyst within the left maxillary sinus. Review of the MIP images confirms the above findings Non-contrast head CT impression #1 called by telephone at the time of interpretation on 12/31/2023 at 5:48 pm to provider DAVID YAO , who verbally acknowledged these results. CTA NECK FINDINGS Aortic arch: The proximal innominate, left common carotid and left subclavian arteries are excluded from the field of view. No hemodynamically significant stenosis within the visible innominate or proximal subclavian arteries. Right carotid system: CCA and ICA patent within the neck without stenosis or significant atherosclerotic disease. Left carotid system: The very proximal common carotid artery is excluded from the field of view. The visible common carotid and internal carotid arteries are patent within the neck without stenosis or significant atherosclerotic disease. Vertebral arteries: Codominant and patent within the neck without stenosis or significant atherosclerotic disease. Skeleton: Cervical spondylosis. No acute fracture or aggressive osseous lesion. The patient is edentulous. Other neck: Multinodular thyroid  gland. The largest nodule is located within the inferior right thyroid  lobe (measuring 2 cm). Upper chest: No consolidation within the imaged lung apices. Review of the MIP images confirms the above findings CTA HEAD FINDINGS Anterior circulation: The intracranial internal carotid arteries are patent. Nonstenotic calcified plaque within both vessels. Severe, near occlusive stenosis of the right middle cerebral artery mid and distal  M1 segment, and of an adjacent proximal right M2 branch. There is an eccentric filling defect at this site suggesting the presence of thrombus (for instance as seen on series 11, images 250 and 251). No left M2 proximal branch occlusion or high-grade proximal stenosis. The anterior cerebral arteries are patent. No intracranial aneurysm is identified. Posterior circulation: The intracranial vertebral arteries are patent. The basilar artery is patent. The posterior cerebral arteries are patent. Hypoplastic right P1 segment with sizable right posterior communicating artery. A left posterior communicating artery is also present. Venous sinuses: Within the limitations of contrast timing, no convincing thrombus. Anatomic variants: As described. Review of the MIP images confirms the above findings CTA head impression #1 called by telephone at the time of interpretation on 12/31/2023 at 5:49 pm to provider DAVID YAO , who verbally acknowledged these results. IMPRESSION: Non-contrast head CT:  1. Large acute right MCA territory infarct with possible mild petechial hemorrhage. Mild mass effect with partial effacement of the right lateral ventricle. No midline shift. 2. Small infarct within the left cerebellar hemisphere, new from the prior head CT of 12/22/2005 but otherwise age-indeterminate. 3. 14 mm left maxillary sinus mucous retention cyst. CTA neck: 1. The very proximal innominate, left common carotid and left subclavian arteries are excluded from the field of view. Within this limitation, findings are as follows. 2. The visible common carotid and internal carotid arteries are patent within the neck without stenosis or significant atherosclerotic disease. 3. Vertebral arteries patent within the neck without stenosis or significant atherosclerotic disease. 4. Multinodular thyroid  gland (with nodules measuring up to 2 cm). A nonemergent thyroid  ultrasound is recommended for further evaluation. CTA head: 1. Severe  near-occlusive stenosis of the right middle cerebral artery mid and distal M1 segment, and of an adjacent proximal right M2 branch. Eccentric filling defect at this site suggesting the presence of thrombus. 2. Non-stenotic atherosclerotic plaque within the intracranial internal carotid arteries. Electronically Signed: By: Rockey Childs D.O. On: 12/31/2023 18:11   DG Chest Port 1 View Result Date: 12/31/2023 CLINICAL DATA:  Altered mental status EXAM: PORTABLE CHEST 1 VIEW COMPARISON:  Chest x-ray 12/26/2011 FINDINGS: The heart size and mediastinal contours are within normal limits. Both lungs are clear. The visualized skeletal structures are unremarkable. IMPRESSION: No active disease. Electronically Signed   By: Greig Pique M.D.   On: 12/31/2023 15:48     Subjective: ***  Discharge Exam: BP 118/79 (BP Location: Right Arm)   Pulse 71   Temp (!) 97.4 F (36.3 C) (Oral)   Resp 19   Ht 5' 5 (1.651 m)   Wt 63.2 kg   SpO2 97%   BMI 23.19 kg/m   General: Pt is alert, awake, not in acute distress Cardiovascular: RRR, S1/S2 +, no rubs, no gallops Respiratory: CTA bilaterally, no wheezing, no rhonchi Abdominal: Soft, NT, ND, bowel sounds + Extremities: no edema, no cyanosis    The results of significant diagnostics from this hospitalization (including imaging, microbiology, ancillary and laboratory) are listed below for reference.     Microbiology: No results found for this or any previous visit (from the past 240 hours).   Labs: Basic Metabolic Panel: Recent Labs  Lab 01/04/24 0445 01/07/24 0823 01/08/24 0449 01/09/24 0455  NA  --  137 137 137  K  --  4.8 4.2 4.8  CL  --  102 102 102  CO2  --  24 28 24   GLUCOSE  --  108* 187* 162*  BUN  --  28* 31* 33*  CREATININE  --  1.15* 1.25* 1.08*  CALCIUM   --  9.1 8.9 8.9  MG 1.8 1.9  --   --   PHOS 2.5 3.0  --   --    Liver Function Tests: Recent Labs  Lab 01/07/24 0823 01/08/24 0449  AST 41 45*  ALT 31 29  ALKPHOS 43  43  BILITOT 0.9 0.4  PROT 6.5 6.2*  ALBUMIN 2.5* 2.4*   CBC: Recent Labs  Lab 01/07/24 0823 01/08/24 0449 01/09/24 0455  WBC 4.3 4.8 5.3  HGB 10.9* 10.6* 11.1*  HCT 35.1* 34.3* 35.5*  MCV 85.8 86.6 86.6  PLT 266 313 352   CBG: Recent Labs  Lab 01/09/24 2051 01/10/24 0000 01/10/24 0345 01/10/24 0740 01/10/24 1254  GLUCAP 164* 128* 186* 149* 113*   Hgb A1c No results for input(s): HGBA1C  in the last 72 hours. Lipid Profile No results for input(s): CHOL, HDL, LDLCALC, TRIG, CHOLHDL, LDLDIRECT in the last 72 hours. Thyroid  function studies No results for input(s): TSH, T4TOTAL, T3FREE, THYROIDAB in the last 72 hours.  Invalid input(s): FREET3 Urinalysis    Component Value Date/Time   COLORURINE AMBER (A) 12/31/2023 1703   APPEARANCEUR TURBID (A) 12/31/2023 1703   LABSPEC 1.020 12/31/2023 1703   PHURINE 5.0 12/31/2023 1703   GLUCOSEU NEGATIVE 12/31/2023 1703   HGBUR NEGATIVE 12/31/2023 1703   BILIRUBINUR NEGATIVE 12/31/2023 1703   BILIRUBINUR negative 04/23/2023 1256   BILIRUBINUR negative 04/12/2022 1301   KETONESUR NEGATIVE 12/31/2023 1703   PROTEINUR 30 (A) 12/31/2023 1703   UROBILINOGEN 0.2 04/23/2023 1256   NITRITE NEGATIVE 12/31/2023 1703   LEUKOCYTESUR LARGE (A) 12/31/2023 1703    FURTHER DISCHARGE INSTRUCTIONS:   Get Medicines reviewed and adjusted: Please take all your medications with you for your next visit with your Primary MD   Laboratory/radiological data: Please request your Primary MD to go over all hospital tests and procedure/radiological results at the follow up, please ask your Primary MD to get all Hospital records sent to his/her office.   In some cases, they will be blood work, cultures and biopsy results pending at the time of your discharge. Please request that your primary care M.D. goes through all the records of your hospital data and follows up on these results.   Also Note the following: If you  experience worsening of your admission symptoms, develop shortness of breath, life threatening emergency, suicidal or homicidal thoughts you must seek medical attention immediately by calling 911 or calling your MD immediately  if symptoms less severe.   You must read complete instructions/literature along with all the possible adverse reactions/side effects for all the Medicines you take and that have been prescribed to you. Take any new Medicines after you have completely understood and accpet all the possible adverse reactions/side effects.    Do not drive when taking Pain medications or sleeping medications (Benzodaizepines)   Do not take more than prescribed Pain, Sleep and Anxiety Medications. It is not advisable to combine anxiety,sleep and pain medications without talking with your primary care practitioner   Special Instructions: If you have smoked or chewed Tobacco  in the last 2 yrs please stop smoking, stop any regular Alcohol  and or any Recreational drug use.   Wear Seat belts while driving.   Please note: You were cared for by a hospitalist during your hospital stay. Once you are discharged, your primary care physician will handle any further medical issues. Please note that NO REFILLS for any discharge medications will be authorized once you are discharged, as it is imperative that you return to your primary care physician (or establish a relationship with a primary care physician if you do not have one) for your post hospital discharge needs so that they can reassess your need for medications and monitor your lab values.  Time coordinating discharge: *** minutes  SIGNED:  Nilda Fendt, MD, PhD 01/10/2024, 2:42 PM

## 2024-01-10 NOTE — Progress Notes (Signed)
 Physical Therapy Treatment Patient Details Name: Priscilla Thornton MRN: [id number] DOB: 09-09-1945 Today's Date: 01/10/2024   History of Present Illness Pt is a 78 y/o F admitted on 12/31/23 after presenting a well check was called on family as pt had not been seen since Saturday (12/29/23). Pt was found down, found to be aphasic with L hemiparesis, R sided deviated gaze. 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 PMH: hyperthyroidism, RA, HTN, a-fib not on AC   PT Comments  Pt in bed upon arrival and agreeable to PT session. Pt continues to need MaxA for bed mobility and MinA/CGA for seated balance. Worked on transfers in today's session with pt able to stand with ModAx2. Transferred to the recliner with MaxAx2 with support for L arm and blocking of L leg. Pt was able to take two steps with R LE when pivoting with L LE buckling. She was then able to stand x2 with increased support needed, MaxAx2. Continue to recommend <3hrs post acute rehab. Acute PT to follow.     If plan is discharge home, recommend the following: Two people to help with walking and/or transfers;Two people to help with bathing/dressing/bathroom   Can travel by private vehicle     No  Equipment Recommendations  Hoyer lift;Wheelchair (measurements PT);Hospital bed;Wheelchair cushion (measurements PT)       Precautions / Restrictions Precautions Precautions: Fall Recall of Precautions/Restrictions: Impaired Precaution/Restrictions Comments: L hemi, PEG Restrictions Weight Bearing Restrictions Per Provider Order: No     Mobility  Bed Mobility Overal bed mobility: Needs Assistance Bed Mobility: Supine to Sit    Supine to sit: Max assist, HOB elevated    General bed mobility comments: able to bring R LE towards EOB. Pt holding onto R rail, however, not initiating pulling. MaxA to complete movement. Assist needed to release R hand from rail once seated on EOB     Transfers Overall transfer level: Needs assistance Equipment used: 2 person hand held assist Transfers: Sit to/from Stand, Bed to chair/wheelchair/BSC Sit to Stand: Mod assist, Max assist, +2 physical assistance, +2 safety/equipment Stand pivot transfers: Max assist, +2 physical assistance, +2 safety/equipment    General transfer comment: ModAx2 for initial stand with blocking of LLE and support for L arm. Able to perform step pivot with LLE buckling when attempting to take step with R LE. MaxAx2 for 2nd and 3rd attempt to stand    Ambulation/Gait    General Gait Details: unable this date  Modified Rankin (Stroke Patients Only) Modified Rankin (Stroke Patients Only) Pre-Morbid Rankin Score: No symptoms Modified Rankin: Severe disability     Balance Overall balance assessment: Needs assistance Sitting-balance support: Feet supported, Single extremity supported, No upper extremity supported Sitting balance-Leahy Scale: Poor Sitting balance - Comments: MinA to CGA for slight posterior and right lateral lean Postural control: Posterior lean, Right lateral lean Standing balance support: Single extremity supported, During functional activity, Reliant on assistive device for balance Standing balance-Leahy Scale: Zero Standing balance comment: ModAx2 to MaxAx2     Communication Communication Communication: Impaired Factors Affecting Communication: Difficulty expressing self  Cognition Arousal: Alert Behavior During Therapy: Flat affect   PT - Cognitive impairments: Awareness, Attention, Initiation, Sequencing, Problem solving, Safety/Judgement Difficult to assess due to: Impaired communication    PT - Cognition Comments: very limited verbalizations with increased time to follow commands Following commands: Impaired Following commands impaired: Follows one step commands with increased time, Follows one step commands inconsistently  Cueing Cueing Techniques: Verbal cues,  Visual cues, Gestural cues, Tactile cues  Exercises General Exercises - Lower Extremity Straight Leg Raises: AROM, Right, 5 reps, Supine Other Exercises Other Exercises: x3 STS with ModAx2 to MaxAx2        Pertinent Vitals/Pain Pain Assessment Pain Assessment: Faces Faces Pain Scale: Hurts little more Pain Location: L knee Pain Descriptors / Indicators: Discomfort, Grimacing Pain Intervention(s): Limited activity within patient's tolerance, Monitored during session, Repositioned     PT Goals (current goals can now be found in the care plan section) Acute Rehab PT Goals PT Goal Formulation: Patient unable to participate in goal setting Time For Goal Achievement: 01/15/24 Potential to Achieve Goals: Fair Progress towards PT goals: Progressing toward goals    Frequency    Min 2X/week       AM-PAC PT 6 Clicks Mobility   Outcome Measure  Help needed turning from your back to your side while in a flat bed without using bedrails?: A Lot Help needed moving from lying on your back to sitting on the side of a flat bed without using bedrails?: A Lot Help needed moving to and from a bed to a chair (including a wheelchair)?: Total Help needed standing up from a chair using your arms (e.g., wheelchair or bedside chair)?: Total Help needed to walk in hospital room?: Total Help needed climbing 3-5 steps with a railing? : Total 6 Click Score: 8    End of Session Equipment Utilized During Treatment: Gait belt Activity Tolerance: Patient tolerated treatment well Patient left: in chair;with call bell/phone within reach;with chair alarm set Nurse Communication: Mobility status;Need for lift equipment PT Visit Diagnosis: Muscle weakness (generalized) (M62.81);Other abnormalities of gait and mobility (R26.89);Difficulty in walking, not elsewhere classified (R26.2);Hemiplegia and hemiparesis;Unsteadiness on feet (R26.81) Hemiplegia - Right/Left: Left Hemiplegia - caused by: Cerebral  infarction     Time: 9190-9169 PT Time Calculation (min) (ACUTE ONLY): 21 min  Charges:    $Therapeutic Activity: 8-22 mins PT General Charges $$ ACUTE PT VISIT: 1 Visit                    Kate ORN, PT, DPT Secure Chat Preferred  Rehab Office [phone]   Kate BRAVO Wendolyn 01/10/2024, 9:21 AM

## 2024-01-10 NOTE — Progress Notes (Signed)
 Nutrition Follow-up  DOCUMENTATION CODES:  Not applicable  INTERVENTION:  Tube feeding via cortrak/PEG tube: Osmolite 1.2 at 55 ml/h (1320 ml per day) Free water  flush 75mL q4h Provides 1584 kcal, 73 gm protein, 1082 ml free water  daily (TF+flush = free water ) Continue Multivitamin with minerals daily Once able, recommend transitioning to bolus feeds via PEG: 1 carton Osmolite 1.5 - QID Start with 1/2 carton at first feed and increase to full carton as tolerated. Free water  flush: 60 mL before and after each bolus + 100 mL TID between bolus feeds Regimen provides 1422 calories, 60 gm protein, and 1500 mL free water  daily.  Continue current diet as tolerated  NUTRITION DIAGNOSIS:  Inadequate oral intake related to inability to eat as evidenced by NPO status. - Ongoing   GOAL:  Patient will meet greater than or equal to 90% of their needs - Being met via TF  MONITOR:  TF tolerance, Labs, Weight trends  REASON FOR ASSESSMENT:  Consult Enteral/tube feeding initiation and management  ASSESSMENT:  Pt with hx of HTN and hypothyroidism presented to ED after she was found with AMS at home. Imaging in ED showed a large right MCA infarct with petechial hemorrhage.  6/23 - admitted 6/24 - SLP BSE, NPO recommended 6/25 - cortrak placed 6/29 - MBS, recommend Dysphagia 1/Honey Thick liquids 6/30 - PEG placed  Pt resting in bedside chair at the time of assessment. No family present. TF infusing at goal rate of 32mL/h. Pt not very talkative, but states that she feels ok today. Unable to verbalize if she ate breakfast.  Noted that pt overall stable and TOC working on discharge. Bolus feeding recommendations are above if needed prior to discharge. Otherwise, will leave on continuous and let SNF change regimen to better fit needs of schedule.  Nutrition Related Medications: Scheduled Meds:  atorvastatin   40 mg Per Tube Daily   folic acid   1 mg Per Tube Daily   free water   75 mL Per  Tube Q4H   multivitamin with minerals  1 tablet Per Tube Daily   Continuous Infusions:  feeding supplement (OSMOLITE 1.2 CAL) Stopped (01/09/24 0000)   PRN Meds: loperamide  Labs: CBG ranges from 113-186 mg/dL over the last 24 hours   Diet Order:   Diet Order             DIET - DYS 1 Room service appropriate? Yes; Fluid consistency: Honey Thick  Diet effective now                  EDUCATION NEEDS:  Education needs have been addressed  Skin:  Skin Assessment: Reviewed RN Assessment  Last BM:  7/2 - type 6  Height:  Ht Readings from Last 1 Encounters:  01/01/24 5' 5 (1.651 m)   Weight:  Wt Readings from Last 1 Encounters:  01/10/24 63.2 kg   Ideal Body Weight:  56.8 kg  BMI:  Body mass index is 23.19 kg/m.  Estimated Nutritional Needs:  Kcal:  1400-1600 kcal/d Protein:  70-85g/d Fluid:  >1.5L/d    Vernell Lukes, RD, LDN, CNSC Registered Dietitian II Please reach out via secure chat

## 2024-01-10 NOTE — Plan of Care (Signed)
  Problem: Nutrition: Goal: Risk of aspiration will decrease Outcome: Progressing   Problem: Activity: Goal: Risk for activity intolerance will decrease Outcome: Progressing   Problem: Pain Managment: Goal: General experience of comfort will improve and/or be controlled Outcome: Progressing   Problem: Safety: Goal: Ability to remain free from injury will improve Outcome: Progressing

## 2024-01-10 NOTE — Progress Notes (Signed)
 PROGRESS NOTE  Priscilla Thornton FMW:[id number] DOB: 1946/04/09 DOA: 12/31/2023 PCP: Georgina Speaks, FNP   LOS: 10 days   Brief hospital course: 78 y.o. F with RA on Arava , hyperthyroidism on methimazole , HTN, CKD IIIa baseline 1.1 who presented with large MCA stroke after being found down.    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 Noninvasive CT angiography showed discrete right MCA occlusion. Follow-up CT showed no development of intraparenchymal hemorrhage. Echocardiogram showed normal EF, carotid imaging no high-grade stenosis. LDL 85, Lipitor increased. Started on DOAC as per neurology. Palliative care consulted, family wished for no limitation of care, even at the end of life. PEG tube placed on 6/30 by IR. - Tolerating tube feeds, awaiting placement   Active problems Dysphagia -Continue tube feed, free water , outpatient speech   Persistent atrial fibrillation (HCC) - Diagnosed last fall, patient missed follow up with Cardiology.  On admission, still in atrial fibrillation.  Repeat CT head negative for hemorrhage, Neurology cleared for anticoagulation.  Due to Eliquis, metoprolol   UTI (urinary tract infection) - Completed 5 days Rocephin .  Remains afebrile    Rheumatoid arthritis - Continue Arava    Mixed hyperlipidemia -Continue Lipitor   CKD 3a -creatinine stable   Hyperthyroidism - TSH normal here. Continue  methimazole    Normocytic anemia - Hgb slightly down, overall stable, no bleeding  Scheduled Meds:  apixaban  5 mg Oral BID   atorvastatin   40 mg Per Tube Daily   folic acid   1 mg Per Tube Daily   free water   75 mL Per Tube Q4H   Gerhardt's butt cream   Topical QID   leflunomide   10 mg Per Tube Daily   losartan   100 mg Oral Daily   methimazole   5 mg Per Tube Daily   metoprolol tartrate  25 mg Per Tube BID   multivitamin with minerals  1 tablet Per Tube Daily   Continuous Infusions:  feeding supplement (OSMOLITE 1.2 CAL) Stopped (01/09/24 0000)   PRN Meds:.acetaminophen  **OR** acetaminophen  (TYLENOL ) oral liquid 160 mg/5 mL **OR** acetaminophen , diclofenac Sodium, loperamide, mouth rinse, oxyCODONE  Current Outpatient Medications  Medication Instructions   acetaminophen  (TYLENOL ) 500 mg, Oral, Every 6 hours PRN   aspirin  81 mg, Daily   atorvastatin  (LIPITOR) 20 MG tablet TAKE 1 TABLET(20 MG) BY MOUTH DAILY   fish oil-omega-3 fatty acids 1 g, Daily   folic acid  (FOLVITE ) 1 mg, Daily   hydrochlorothiazide  (HYDRODIURIL ) 12.5 MG tablet TAKE 1 TABLET(12.5 MG) BY MOUTH DAILY   leflunomide  (ARAVA ) 10 mg, Daily   losartan  (COZAAR ) 100 MG tablet TAKE 1 TABLET(100 MG) BY MOUTH DAILY   methimazole  (TAPAZOLE ) 5 mg, Daily   Multiple Vitamins-Calcium  (ONE-A-DAY WOMENS FORMULA PO) 1 tablet, Daily   potassium chloride  (KLOR-CON  M) 10 MEQ tablet TAKE 1 TABLET(10 MEQ) BY MOUTH DAILY WITH FOOD    Diet Orders (From admission, onward)     Start     Ordered   01/09/24 0943  DIET -  DYS 1 Room service appropriate? Yes; Fluid consistency: Honey Thick  Diet effective now       Comments: Meds via PEG Full supervision and assist with all PO's  Question Answer Comment  Room service appropriate? Yes   Fluid consistency: Honey Thick      01/09/24 0945            DVT prophylaxis: SCD's Start: 01/01/24 0845 apixaban (ELIQUIS) tablet 5 mg   Lab Results  Component Value Date   PLT 352  01/09/2024      Code Status: Full Code  Family Communication: no family at bedside   Status is: Inpatient Remains inpatient appropriate because: severity of illness  Level of care: Telemetry Medical  Consultants:  Neurology   Objective: Vitals:   01/09/24 2359 01/10/24 0410 01/10/24 0500 01/10/24 0739  BP: 126/80 111/68  118/79  Pulse: 70 73  71  Resp: 20 16  19   Temp: 98.3 F (36.8 C) 97.7 F (36.5 C)  (!) 97.4 F (36.3 C)  TempSrc: Oral Oral  Oral  SpO2: 98% 98%  97%  Weight:   63.2 kg   Height:        Intake/Output Summary (Last 24 hours) at 01/10/2024 1044 Last data filed at 01/09/2024 2000 Gross per 24 hour  Intake 60 ml  Output 0 ml  Net 60 ml   Wt Readings from Last 3 Encounters:  01/10/24 63.2 kg  08/14/23 60.8 kg  04/23/23 61.1 kg    Examination:  Constitutional: NAD Eyes: lids and conjunctivae normal, no scleral icterus ENMT: mmm Neck: normal, supple Respiratory: clear to auscultation bilaterally, no wheezing, no crackles. Normal respiratory effort.  Cardiovascular: Regular rate and rhythm, no murmurs / rubs / gallops. No LE edema. Abdomen: soft, no distention, no tenderness. Bowel sounds positive.    Data Reviewed: I have independently reviewed following labs and imaging studies   CBC Recent Labs  Lab 01/07/24 0823 01/08/24 0449 01/09/24 0455  WBC 4.3 4.8 5.3  HGB 10.9* 10.6* 11.1*  HCT 35.1* 34.3* 35.5*  PLT 266 313 352  MCV 85.8 86.6 86.6  MCH 26.7 26.8 27.1  MCHC 31.1 30.9 31.3  RDW 13.2 13.1 13.1    Recent Labs  Lab 01/04/24 0445 01/07/24 0823 01/08/24 0449 01/09/24 0455  NA  --  137 137 137  K  --  4.8 4.2 4.8  CL  --  102 102 102  CO2  --  24 28 24   GLUCOSE  --  108* 187* 162*  BUN  --  28* 31* 33*  CREATININE  --  1.15* 1.25* 1.08*  CALCIUM   --  9.1 8.9 8.9  AST  --  41 45*  --   ALT  --  31 29  --   ALKPHOS  --  43 43  --   BILITOT  --  0.9 0.4  --   ALBUMIN  --  2.5* 2.4*  --   MG 1.8 1.9  --   --   INR  --   1.1  --   --     ------------------------------------------------------------------------------------------------------------------ No results for input(s): CHOL, HDL, LDLCALC, TRIG, CHOLHDL, LDLDIRECT in the last 72 hours.  Lab Results  Component Value Date   HGBA1C 6.0 (H) 01/01/2024   ------------------------------------------------------------------------------------------------------------------ No results for input(s): TSH, T4TOTAL, T3FREE, THYROIDAB in the last 72 hours.  Invalid input(s): FREET3  Cardiac Enzymes No results for input(s): CKMB, TROPONINI, MYOGLOBIN in the last 168 hours.  Invalid input(s): CK ------------------------------------------------------------------------------------------------------------------  No results found for: BNP  CBG: Recent Labs  Lab 01/09/24 1615 01/09/24 2051 01/10/24 0000 01/10/24 0345 01/10/24 0740  GLUCAP 189* 164* 128* 186* 149*    No results found for this or any previous visit (from the past 240 hours).   Radiology Studies: No results found.   Nilda Fendt, MD, PhD [facility]  Between 7 am - 7 pm I am available, please contact me via Amion (for emergencies) or Securechat (non urgent messages)  Between 7 pm - 7 am I am not available, please contact night coverage MD/APP via Amion

## 2024-01-11 DIAGNOSIS — I63411 Cerebral infarction due to embolism of right middle cerebral artery: Secondary | ICD-10-CM | POA: Diagnosis not present

## 2024-01-11 LAB — GLUCOSE, CAPILLARY
Glucose-Capillary: 142 mg/dL — ABNORMAL HIGH (ref 70–99)
Glucose-Capillary: 157 mg/dL — ABNORMAL HIGH (ref 70–99)
Glucose-Capillary: 162 mg/dL — ABNORMAL HIGH (ref 70–99)
Glucose-Capillary: 111 mg/dL — ABNORMAL HIGH (ref 70–99)
Glucose-Capillary: 130 mg/dL — ABNORMAL HIGH (ref 70–99)
Glucose-Capillary: 132 mg/dL — ABNORMAL HIGH (ref 70–99)

## 2024-01-11 NOTE — Plan of Care (Addendum)
 Maintained on Peg tube feeding, tolerated well. No other complaints noted. Problem: Ischemic Stroke/TIA Tissue Perfusion: Goal: Complications of ischemic stroke/TIA will be minimized Outcome: Progressing   Problem: Coping: Goal: Will verbalize positive feelings about self Outcome: Progressing   Problem: Self-Care: Goal: Ability to communicate needs accurately will improve Outcome: Progressing   Problem: Activity: Goal: Risk for activity intolerance will decrease Outcome: Progressing   Problem: Safety: Goal: Ability to remain free from injury will improve Outcome: Progressing   Problem: Skin Integrity: Goal: Risk for impaired skin integrity will decrease Outcome: Progressing   Problem: Pain Managment: Goal: General experience of comfort will improve and/or be controlled Outcome: Progressing

## 2024-01-11 NOTE — Plan of Care (Signed)
  Problem: Ischemic Stroke/TIA Tissue Perfusion: Goal: Complications of ischemic stroke/TIA will be minimized Outcome: Progressing   Problem: Coping: Goal: Will verbalize positive feelings about self Outcome: Progressing Goal: Will identify appropriate support needs Outcome: Progressing   Problem: Health Behavior/Discharge Planning: Goal: Ability to manage health-related needs will improve Outcome: Progressing Goal: Goals will be collaboratively established with patient/family Outcome: Progressing   Problem: Self-Care: Goal: Ability to participate in self-care as condition permits will improve Outcome: Progressing Goal: Verbalization of feelings and concerns over difficulty with self-care will improve Outcome: Progressing Goal: Ability to communicate needs accurately will improve Outcome: Progressing   Problem: Nutrition: Goal: Risk of aspiration will decrease Outcome: Progressing Goal: Dietary intake will improve Outcome: Progressing   Problem: Health Behavior/Discharge Planning: Goal: Ability to manage health-related needs will improve Outcome: Progressing   Problem: Clinical Measurements: Goal: Ability to maintain clinical measurements within normal limits will improve Outcome: Progressing Goal: Will remain free from infection Outcome: Progressing Goal: Diagnostic test results will improve Outcome: Progressing Goal: Respiratory complications will improve Outcome: Progressing Goal: Cardiovascular complication will be avoided Outcome: Progressing   Problem: Activity: Goal: Risk for activity intolerance will decrease Outcome: Progressing   Problem: Nutrition: Goal: Adequate nutrition will be maintained Outcome: Progressing   Problem: Coping: Goal: Level of anxiety will decrease Outcome: Progressing   Problem: Elimination: Goal: Will not experience complications related to bowel motility Outcome: Progressing Goal: Will not experience complications related  to urinary retention Outcome: Progressing   Problem: Skin Integrity: Goal: Risk for impaired skin integrity will decrease Outcome: Progressing

## 2024-01-11 NOTE — Progress Notes (Signed)
 PROGRESS NOTE  Priscilla Thornton FMW:[id number] DOB: January 01, 1946 DOA: 12/31/2023 PCP: Georgina Speaks, FNP   LOS: 11 days   Brief hospital course: 78 y.o. F with RA on Arava , hyperthyroidism on methimazole , HTN, CKD IIIa baseline 1.1 who presented with large MCA stroke after being found down.    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 Noninvasive CT angiography showed discrete right MCA occlusion. Follow-up CT showed no development of intraparenchymal hemorrhage. Echocardiogram showed normal EF, carotid imaging no high-grade stenosis. LDL 85, Lipitor increased. Started on DOAC as per neurology. Palliative care consulted, family wished for no limitation of care. PEG tube placed on 6/30 by IR. - Tolerating tube feeds, awaiting placement, on Monday per TOC   Active problems Dysphagia -Continue tube feed, free water , outpatient speech   Persistent atrial fibrillation (HCC) - Diagnosed last fall, patient missed follow up with Cardiology.  On admission, still in atrial fibrillation.  Repeat CT head negative for hemorrhage, Neurology cleared for anticoagulation.  Continue Eliquis , metoprolol    UTI (urinary tract infection) - Completed 5 days Rocephin .  Remains afebrile    Rheumatoid arthritis - Continue Arava    Mixed hyperlipidemia -Continue Lipitor   CKD 3a -creatinine stable   Hyperthyroidism - TSH normal here. Continue methimazole    Normocytic anemia - Hgb slightly down,  overall stable, no bleeding  Scheduled Meds:  apixaban   5 mg Oral BID   atorvastatin   40 mg Per Tube Daily   folic acid   1 mg Per Tube Daily   free water   75 mL Per Tube Q4H   Gerhardt's butt cream   Topical QID   leflunomide   10 mg Per Tube Daily   losartan   100 mg Oral Daily   methimazole   5 mg Per Tube Daily   metoprolol  tartrate  25 mg Per Tube BID   multivitamin with minerals  1 tablet Per Tube Daily   Continuous Infusions:  feeding supplement (OSMOLITE 1.2 CAL) 1,000 mL (01/11/24 0543)   PRN Meds:.acetaminophen  **OR** acetaminophen  (TYLENOL ) oral liquid 160 mg/5 mL **OR** acetaminophen , diclofenac  Sodium, loperamide , mouth rinse, oxyCODONE   Current Outpatient Medications  Medication Instructions   acetaminophen  (TYLENOL ) 500 mg, Oral, Every 6 hours PRN   apixaban  (ELIQUIS ) 5 mg, Oral, 2 times daily   aspirin  81 mg, Daily   atorvastatin  (LIPITOR) 20 MG tablet TAKE 1 TABLET(20 MG) BY MOUTH DAILY   atorvastatin  (LIPITOR) 40 mg, Per Tube, Daily   fish oil-omega-3 fatty acids 1 g, Daily   folic acid  (FOLVITE ) 1 mg, Daily   hydrochlorothiazide  (HYDRODIURIL ) 12.5 MG tablet TAKE 1 TABLET(12.5 MG) BY MOUTH DAILY   leflunomide  (ARAVA ) 10 mg, Daily   losartan  (COZAAR ) 100 MG tablet TAKE 1 TABLET(100 MG) BY MOUTH DAILY   methimazole  (TAPAZOLE ) 5 mg, Daily   metoprolol  tartrate (LOPRESSOR ) 25 mg, Per Tube, 2 times daily   Multiple Vitamins-Calcium  (ONE-A-DAY WOMENS FORMULA PO) 1 tablet, Daily   Nutritional Supplements (FEEDING SUPPLEMENT, OSMOLITE 1.2 CAL,) LIQD 1,000 mLs, Per Tube, Continuous   oxyCODONE  (OXY IR/ROXICODONE ) 5 mg,  Oral, Every 8 hours PRN   potassium chloride  (KLOR-CON  M) 10 MEQ tablet TAKE 1 TABLET(10 MEQ) BY MOUTH DAILY WITH FOOD   Water  For Irrigation, Sterile (FREE WATER ) SOLN 75 mLs, Per Tube, Every 4 hours    Diet Orders (From admission, onward)     Start     Ordered   01/09/24 0943  DIET - DYS 1 Room service appropriate? Yes; Fluid consistency: Honey Thick   Diet effective now       Comments: Meds via PEG Full supervision and assist with all PO's  Question Answer Comment  Room service appropriate? Yes   Fluid consistency: Honey Thick      01/09/24 0945            DVT prophylaxis: SCD's Start: 01/01/24 0845 apixaban  (ELIQUIS ) tablet 5 mg   Lab Results  Component Value Date   PLT 352 01/09/2024      Code Status: Full Code  Family Communication: no family at bedside   Status is: Inpatient Remains inpatient appropriate because: severity of illness  Level of care: Telemetry Medical  Consultants:  Neurology   Objective: Vitals:   01/10/24 2334 01/11/24 0346 01/11/24 0500 01/11/24 0833  BP: 137/63 (!) 125/91  (!) 125/91  Pulse: 69 65    Resp: 16 18    Temp: 100.3 F (37.9 C) 98.7 F (37.1 C)    TempSrc: Oral Oral    SpO2: 97% 96%    Weight:   65.3 kg   Height:        Intake/Output Summary (Last 24 hours) at 01/11/2024 1005 Last data filed at 01/11/2024 0405 Gross per 24 hour  Intake 30 ml  Output 300 ml  Net -270 ml   Wt Readings from Last 3 Encounters:  01/11/24 65.3 kg  08/14/23 60.8 kg  04/23/23 61.1 kg    Examination:  Constitutional: NAD Eyes: lids and conjunctivae normal, no scleral icterus ENMT: mmm Neck: normal, supple Respiratory: clear to auscultation bilaterally, no wheezing, no crackles. Normal respiratory effort.  Cardiovascular: Regular rate and rhythm, no murmurs / rubs / gallops. No LE edema. Abdomen: soft, no distention, no tenderness. Bowel sounds positive.   Data Reviewed: I have independently reviewed following labs and imaging studies   CBC Recent Labs  Lab 01/07/24 0823 01/08/24 0449 01/09/24 0455  WBC 4.3 4.8 5.3  HGB 10.9* 10.6* 11.1*  HCT 35.1* 34.3* 35.5*  PLT 266 313 352  MCV 85.8 86.6 86.6  MCH 26.7 26.8 27.1  MCHC 31.1 30.9 31.3  RDW 13.2 13.1 13.1    Recent Labs  Lab 01/07/24 0823 01/08/24 0449 01/09/24 0455  NA 137 137 137  K 4.8 4.2 4.8  CL 102 102 102   CO2 24 28 24   GLUCOSE 108* 187* 162*  BUN 28* 31* 33*  CREATININE 1.15* 1.25* 1.08*  CALCIUM  9.1 8.9 8.9  AST 41 45*  --   ALT 31 29  --   ALKPHOS 43 43  --   BILITOT 0.9 0.4  --   ALBUMIN 2.5* 2.4*  --   MG 1.9  --   --   INR 1.1  --   --     ------------------------------------------------------------------------------------------------------------------ No results for input(s): CHOL, HDL, LDLCALC, TRIG, CHOLHDL, LDLDIRECT in the last 72 hours.  Lab Results  Component Value Date   HGBA1C 6.0 (H) 01/01/2024   ------------------------------------------------------------------------------------------------------------------ No results for input(s): TSH, T4TOTAL, T3FREE, THYROIDAB in the last 72 hours.  Invalid input(s): FREET3  Cardiac Enzymes No  results for input(s): CKMB, TROPONINI, MYOGLOBIN in the last 168 hours.  Invalid input(s): CK ------------------------------------------------------------------------------------------------------------------ No results found for: BNP  CBG: Recent Labs  Lab 01/10/24 1623 01/10/24 1942 01/10/24 2333 01/11/24 0349 01/11/24 0758  GLUCAP 133* 144* 128* 142* 157*    No results found for this or any previous visit (from the past 240 hours).   Radiology Studies: No results found.   Nilda Fendt, MD, PhD [facility]  Between 7 am - 7 pm I am available, please contact me via Amion (for emergencies) or Securechat (non urgent messages)  Between 7 pm - 7 am I am not available, please contact night coverage MD/APP via Amion

## 2024-01-11 NOTE — Progress Notes (Signed)
 Occupational Therapy Treatment Patient Details Name: Priscilla Thornton MRN: [id number] DOB: 1945/12/16 Today's Date: 01/11/2024   History of present illness Pt is a 78 y/o F admitted on 12/31/23 after presenting a well check was called on family as pt had not been seen since Saturday (12/29/23). Pt was found down, found to be aphasic with L hemiparesis, R sided deviated gaze. 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 PMH: hyperthyroidism, RA, HTN, a-fib not on AC   OT comments  Pt alert today but not as accurate with responses, stating mostly yes and following <20% of commands. She had a strong L lean with very poor sitting balance that we were unable to correct with just +1 assist today, needing max A for EOB sitting balance. Returned pt to sidelying in bed to work on her apraxia and LUE tone. OT to continue following pt acutely to progress pt as able. Patient will benefit from continued inpatient follow up therapy, <3 hours/day       If plan is discharge home, recommend the following:  A lot of help with walking and/or transfers;A lot of help with bathing/dressing/bathroom;Assistance with cooking/housework;Direct supervision/assist for financial management;Supervision due to cognitive status;Help with stairs or ramp for entrance;Assist for transportation;Direct supervision/assist for medications management   Equipment Recommendations  BSC/3in1;Wheelchair (measurements OT);Wheelchair cushion (measurements OT)    Recommendations for Other Services      Precautions / Restrictions Precautions Precautions: Fall Recall of Precautions/Restrictions: Impaired Precaution/Restrictions Comments: L hemi, PEG Restrictions Weight Bearing Restrictions Per Provider Order: No       Mobility Bed Mobility Overal bed mobility: Needs Assistance Bed Mobility: Supine to Sit, Sit to Supine     Supine to sit: Total assist Sit to supine: Total assist         Transfers                   General transfer comment: unable to progress to OOB with poor sitting balance today.     Balance Overall balance assessment: Needs assistance Sitting-balance support: Feet supported, Single extremity supported, No upper extremity supported Sitting balance-Leahy Scale: Poor Sitting balance - Comments: Max A to correct posture, unable to progress balance without ext assist from OT. Postural control: Left lateral lean                                 ADL either performed or assessed with clinical judgement   ADL Overall ADL's : Needs assistance/impaired Eating/Feeding: NPO Eating/Feeding Details (indicate cue type and reason): PEG Grooming: Bed level;Maximal assistance Grooming Details (indicate cue type and reason): applying lotion to L hand, hand over hand assist needed for task initiation.                                    Extremity/Trunk Assessment Upper Extremity Assessment LUE Deficits / Details: inattention to UE.  Subluxation noted in shoulder. No active movement noted, PROM WFL with tone increasing in flexor synergy patterns- esp in elbow flexors and shoulder internal rotators LUE: Subluxation noted LUE Sensation: decreased light touch;decreased proprioception LUE Coordination: decreased fine motor;decreased gross motor            Vision       Perception Perception Perception: Impaired Preception Impairment Details: Inattention/Neglect Perception-Other Comments: L side of body and environment  Praxis Praxis Praxis: Impaired Praxis Impairment Details: Motor planning;Ideation Praxis-Other Comments: Pt asked to demonstrate putting on lotion application, went to put lotion in mouth and needed gestural/visual cue  and hand over hand assist to guide pt for proper use. Visual demonstration needed for how to use toothbrush, pt able to return demonstrate.   Communication Communication Communication:  Impaired Factors Affecting Communication: Difficulty expressing self   Cognition Arousal: Alert Behavior During Therapy: Flat affect Cognition: Difficult to assess, Cognition impaired Difficult to assess due to: Impaired communication Orientation impairments:  (not responding to prompts) Awareness: Intellectual awareness impaired, Online awareness impaired Memory impairment (select all impairments): Short-term memory, Working memory Attention impairment (select first level of impairment): Sustained attention Executive functioning impairment (select all impairments): Problem solving, Organization, Reasoning, Initiation, Sequencing OT - Cognition Comments: Pt following <20% of commands, continously reaching at lines/leads when sitting EOB                 Following commands: Impaired Following commands impaired: Follows one step commands with increased time, Follows one step commands inconsistently      Cueing   Cueing Techniques: Verbal cues, Visual cues, Gestural cues, Tactile cues  Exercises Other Exercises Other Exercises: LUE ext rotation prolonged stretch. Other Exercises: LUE d1 flex/ext PROM Other Exercises: LUE lateral leans x8 reps with hold-for sublux. Other Exercises: Sitting EOB, anterior pelvic tilt with chest expansion 10sec hold x5 reps.    Shoulder Instructions       General Comments Pt head positioned in neutral with pillows.    Pertinent Vitals/ Pain       Pain Assessment Pain Assessment: Faces Faces Pain Scale: No hurt  Home Living                                          Prior Functioning/Environment              Frequency  Min 2X/week        Progress Toward Goals  OT Goals(current goals can now be found in the care plan section)  Progress towards OT goals: OT to reassess next treatment  Acute Rehab OT Goals OT Goal Formulation: Patient unable to participate in goal setting Time For Goal Achievement:  01/16/24 Potential to Achieve Goals: Fair  Plan      Co-evaluation                 AM-PAC OT 6 Clicks Daily Activity     Outcome Measure   Help from another person eating meals?: Total (PEG) Help from another person taking care of personal grooming?: A Lot Help from another person toileting, which includes using toliet, bedpan, or urinal?: Total Help from another person bathing (including washing, rinsing, drying)?: A Lot Help from another person to put on and taking off regular upper body clothing?: A Lot Help from another person to put on and taking off regular lower body clothing?: Total 6 Click Score: 9    End of Session    OT Visit Diagnosis: Unsteadiness on feet (R26.81);Other abnormalities of gait and mobility (R26.89);Hemiplegia and hemiparesis;Muscle weakness (generalized) (M62.81);Other symptoms and signs involving cognitive function Hemiplegia - Right/Left: Left Hemiplegia - dominant/non-dominant: Non-Dominant Hemiplegia - caused by: Cerebral infarction   Activity Tolerance Other (comment) (limited by cog)   Patient Left in bed;with call bell/phone within reach;with bed alarm set   Nurse Communication Mobility status  Time: 8471-8445 OT Time Calculation (min): 26 min  Charges: OT General Charges $OT Visit: 1 Visit OT Treatments $Therapeutic Activity: 23-37 mins  01/11/2024  AB, OTR/L  Acute Rehabilitation Services  Office: [phone]   Curtistine JONETTA Das 01/11/2024, 5:56 PM

## 2024-01-12 DIAGNOSIS — I63411 Cerebral infarction due to embolism of right middle cerebral artery: Secondary | ICD-10-CM | POA: Diagnosis not present

## 2024-01-12 LAB — GLUCOSE, CAPILLARY
Glucose-Capillary: 136 mg/dL — ABNORMAL HIGH (ref 70–99)
Glucose-Capillary: 139 mg/dL — ABNORMAL HIGH (ref 70–99)
Glucose-Capillary: 139 mg/dL — ABNORMAL HIGH (ref 70–99)
Glucose-Capillary: 145 mg/dL — ABNORMAL HIGH (ref 70–99)
Glucose-Capillary: 146 mg/dL — ABNORMAL HIGH (ref 70–99)
Glucose-Capillary: 147 mg/dL — ABNORMAL HIGH (ref 70–99)

## 2024-01-12 NOTE — Plan of Care (Signed)
  Problem: Education: Goal: Knowledge of disease or condition will improve Outcome: Progressing Goal: Knowledge of secondary prevention will improve (MUST DOCUMENT ALL) Outcome: Progressing Goal: Knowledge of patient specific risk factors will improve (DELETE if not current risk factor) Outcome: Progressing   Problem: Ischemic Stroke/TIA Tissue Perfusion: Goal: Complications of ischemic stroke/TIA will be minimized Outcome: Progressing   Problem: Health Behavior/Discharge Planning: Goal: Ability to manage health-related needs will improve Outcome: Progressing Goal: Goals will be collaboratively established with patient/family Outcome: Progressing   Problem: Self-Care: Goal: Ability to participate in self-care as condition permits will improve Outcome: Progressing Goal: Verbalization of feelings and concerns over difficulty with self-care will improve Outcome: Progressing Goal: Ability to communicate needs accurately will improve Outcome: Progressing   Problem: Nutrition: Goal: Risk of aspiration will decrease Outcome: Progressing Goal: Dietary intake will improve Outcome: Progressing   Problem: Education: Goal: Knowledge of General Education information will improve Description: Including pain rating scale, medication(s)/side effects and non-pharmacologic comfort measures Outcome: Progressing   Problem: Activity: Goal: Risk for activity intolerance will decrease Outcome: Progressing   Problem: Nutrition: Goal: Adequate nutrition will be maintained Outcome: Progressing   Problem: Coping: Goal: Level of anxiety will decrease Outcome: Progressing   Problem: Elimination: Goal: Will not experience complications related to bowel motility Outcome: Progressing Goal: Will not experience complications related to urinary retention Outcome: Progressing   Problem: Pain Managment: Goal: General experience of comfort will improve and/or be controlled Outcome: Progressing    Problem: Safety: Goal: Ability to remain free from injury will improve Outcome: Progressing   Problem: Skin Integrity: Goal: Risk for impaired skin integrity will decrease Outcome: Progressing

## 2024-01-12 NOTE — Progress Notes (Signed)
 PROGRESS NOTE  Priscilla Thornton FMW:[id number] DOB: 20-Mar-1946 DOA: 12/31/2023 PCP: Georgina Speaks, FNP   LOS: 12 days   Brief hospital course: 78 y.o. F with RA on Arava , hyperthyroidism on methimazole , HTN, CKD IIIa baseline 1.1 who presented with large MCA stroke after being found down.    Subjective / 24h Interval events: Alert, smiling.  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 Noninvasive CT angiography showed discrete right MCA occlusion. Follow-up CT showed no development of intraparenchymal hemorrhage. Echocardiogram showed normal EF, carotid imaging no high-grade stenosis. LDL 85, Lipitor increased. Started on DOAC as per neurology. Palliative care consulted, family wished for no limitation of care. PEG tube placed on 6/30 by IR. - Breathing tube feeds well, no nausea, no vomiting   Active problems Dysphagia -Continue tube feed, free water , outpatient speech   Persistent atrial fibrillation (HCC) - Diagnosed last fall, patient missed follow up with Cardiology.  On admission, still in atrial fibrillation.  Repeat CT head negative for hemorrhage, Neurology cleared for anticoagulation.  Continue Eliquis , metoprolol .  Rate controlled, no evidence of bleeding   UTI (urinary tract infection) - Completed 5 days Rocephin .  Afebrile    Rheumatoid arthritis - Continue Arava    Mixed hyperlipidemia -Continue Lipitor   CKD 3a -creatinine stable   Hyperthyroidism - TSH normal here. Continue methimazole    Normocytic  anemia - Hgb slightly down, overall stable, no bleeding  Scheduled Meds:  apixaban   5 mg Oral BID   atorvastatin   40 mg Per Tube Daily   folic acid   1 mg Per Tube Daily   free water   75 mL Per Tube Q4H   Gerhardt's butt cream   Topical QID   leflunomide   10 mg Per Tube Daily   losartan   100 mg Oral Daily   methimazole   5 mg Per Tube Daily   metoprolol  tartrate  25 mg Per Tube BID   multivitamin with minerals  1 tablet Per Tube Daily   Continuous Infusions:  feeding supplement (OSMOLITE 1.2 CAL) 1,000 mL (01/12/24 0128)   PRN Meds:.acetaminophen  **OR** acetaminophen  (TYLENOL ) oral liquid 160 mg/5 mL **OR** acetaminophen , diclofenac  Sodium, loperamide , mouth rinse, oxyCODONE   Current Outpatient Medications  Medication Instructions   acetaminophen  (TYLENOL ) 500 mg, Oral, Every 6 hours PRN   apixaban  (ELIQUIS ) 5 mg, Oral, 2 times daily   aspirin  81 mg, Daily   atorvastatin  (LIPITOR) 20 MG tablet TAKE 1 TABLET(20 MG) BY MOUTH DAILY   atorvastatin  (LIPITOR) 40 mg, Per Tube, Daily   fish oil-omega-3 fatty acids 1 g, Daily   folic acid  (FOLVITE ) 1 mg, Daily   hydrochlorothiazide  (HYDRODIURIL ) 12.5 MG tablet TAKE 1 TABLET(12.5 MG) BY MOUTH DAILY   leflunomide  (ARAVA ) 10 mg, Daily   losartan  (COZAAR ) 100 MG tablet TAKE 1 TABLET(100 MG) BY MOUTH DAILY   methimazole  (TAPAZOLE ) 5 mg, Daily   metoprolol  tartrate (LOPRESSOR ) 25 mg, Per Tube, 2 times daily   Multiple Vitamins-Calcium  (ONE-A-DAY WOMENS FORMULA PO) 1 tablet, Daily   Nutritional Supplements (FEEDING SUPPLEMENT, OSMOLITE 1.2 CAL,) LIQD 1,000 mLs, Per Tube, Continuous  oxyCODONE  (OXY IR/ROXICODONE ) 5 mg, Oral, Every 8 hours PRN   potassium chloride  (KLOR-CON  M) 10 MEQ tablet TAKE 1 TABLET(10 MEQ) BY MOUTH DAILY WITH FOOD   Water  For Irrigation, Sterile (FREE WATER ) SOLN 75 mLs, Per Tube, Every 4 hours    Diet Orders (From admission, onward)     Start     Ordered   01/09/24 0943  DIET - DYS 1 Room service appropriate? Yes;  Fluid consistency: Honey Thick  Diet effective now       Comments: Meds via PEG Full supervision and assist with all PO's  Question Answer Comment  Room service appropriate? Yes   Fluid consistency: Honey Thick      01/09/24 0945            DVT prophylaxis: SCD's Start: 01/01/24 0845 apixaban  (ELIQUIS ) tablet 5 mg   Lab Results  Component Value Date   PLT 352 01/09/2024      Code Status: Full Code  Family Communication: no family at bedside   Status is: Inpatient Remains inpatient appropriate because: severity of illness  Level of care: Telemetry Medical  Consultants:  Neurology   Objective: Vitals:   01/11/24 2324 01/12/24 0341 01/12/24 0500 01/12/24 0840  BP: (!) 153/79 (!) 145/94  (!) 138/91  Pulse: 62 63  83  Resp:  18  16  Temp: 99.1 F (37.3 C) 98.1 F (36.7 C)  98 F (36.7 C)  TempSrc: Oral Oral  Oral  SpO2: 97% 96%  98%  Weight:   62.3 kg   Height:        Intake/Output Summary (Last 24 hours) at 01/12/2024 0959 Last data filed at 01/12/2024 0510 Gross per 24 hour  Intake --  Output 1150 ml  Net -1150 ml   Wt Readings from Last 3 Encounters:  01/12/24 62.3 kg  08/14/23 60.8 kg  04/23/23 61.1 kg    Examination:  Constitutional: NAD Eyes: lids and conjunctivae normal, no scleral icterus ENMT: mmm Neck: normal, supple Respiratory: clear to auscultation bilaterally, no wheezing, no crackles. Normal respiratory effort.  Cardiovascular: Regular rate and rhythm, no murmurs / rubs / gallops. No LE edema. Abdomen: soft, no distention, no tenderness. Bowel sounds positive.   Data Reviewed: I have independently reviewed following labs and imaging studies   CBC Recent Labs  Lab 01/07/24 0823 01/08/24 0449 01/09/24 0455  WBC 4.3 4.8 5.3  HGB 10.9* 10.6* 11.1*  HCT 35.1* 34.3* 35.5*  PLT 266 313 352  MCV 85.8 86.6 86.6  MCH 26.7 26.8 27.1  MCHC 31.1 30.9 31.3  RDW 13.2 13.1 13.1    Recent Labs  Lab 01/07/24 0823 01/08/24 0449  01/09/24 0455  NA 137 137 137  K 4.8 4.2 4.8  CL 102 102 102  CO2 24 28 24   GLUCOSE 108* 187* 162*  BUN 28* 31* 33*  CREATININE 1.15* 1.25* 1.08*  CALCIUM  9.1 8.9 8.9  AST 41 45*  --   ALT 31 29  --   ALKPHOS 43 43  --   BILITOT 0.9 0.4  --   ALBUMIN 2.5* 2.4*  --   MG 1.9  --   --   INR 1.1  --   --     ------------------------------------------------------------------------------------------------------------------ No results for input(s): CHOL, HDL, LDLCALC, TRIG, CHOLHDL, LDLDIRECT in the last 72 hours.  Lab Results  Component Value Date   HGBA1C 6.0 (H) 01/01/2024   ------------------------------------------------------------------------------------------------------------------ No results for input(s): TSH, T4TOTAL, T3FREE, THYROIDAB in the last 72 hours.  Invalid input(s): FREET3  Cardiac Enzymes No results for input(s): CKMB, TROPONINI, MYOGLOBIN in the last 168 hours.  Invalid input(s): CK ------------------------------------------------------------------------------------------------------------------ No results found for: BNP  CBG: Recent Labs  Lab 01/11/24 1517 01/11/24 1948 01/11/24 2320 01/12/24 0325 01/12/24 0817  GLUCAP 111* 130* 132* 147* 139*    No results found for this or any previous visit (from the past 240 hours).   Radiology Studies: No results found.   Nilda Fendt, MD, PhD [facility]  Between 7 am - 7 pm I am available, please contact me via Amion (for emergencies) or Securechat (non urgent messages)  Between 7 pm - 7 am I am not available, please contact night coverage MD/APP via Amion

## 2024-01-12 NOTE — Plan of Care (Signed)
  Problem: Ischemic Stroke/TIA Tissue Perfusion: Goal: Complications of ischemic stroke/TIA will be minimized Outcome: Progressing   Problem: Coping: Goal: Will verbalize positive feelings about self Outcome: Progressing   Problem: Health Behavior/Discharge Planning: Goal: Ability to manage health-related needs will improve Outcome: Progressing   Problem: Self-Care: Goal: Verbalization of feelings and concerns over difficulty with self-care will improve Outcome: Progressing   Problem: Self-Care: Goal: Ability to participate in self-care as condition permits will improve Outcome: Progressing   Problem: Self-Care: Goal: Ability to communicate needs accurately will improve Outcome: Progressing   Problem: Nutrition: Goal: Risk of aspiration will decrease Outcome: Progressing   Problem: Education: Goal: Knowledge of General Education information will improve Description: Including pain rating scale, medication(s)/side effects and non-pharmacologic comfort measures Outcome: Progressing   Problem: Clinical Measurements: Goal: Will remain free from infection Outcome: Progressing   Problem: Activity: Goal: Risk for activity intolerance will decrease Outcome: Progressing   Problem: Nutrition: Goal: Adequate nutrition will be maintained Outcome: Progressing   Problem: Elimination: Goal: Will not experience complications related to bowel motility Outcome: Progressing   Problem: Pain Managment: Goal: General experience of comfort will improve and/or be controlled Outcome: Progressing   Problem: Safety: Goal: Ability to remain free from injury will improve Outcome: Progressing   Problem: Skin Integrity: Goal: Risk for impaired skin integrity will decrease Outcome: Progressing

## 2024-01-13 ENCOUNTER — Other Ambulatory Visit: Payer: Self-pay

## 2024-01-13 DIAGNOSIS — I63411 Cerebral infarction due to embolism of right middle cerebral artery: Secondary | ICD-10-CM | POA: Diagnosis not present

## 2024-01-13 LAB — GLUCOSE, CAPILLARY
Glucose-Capillary: 155 mg/dL — ABNORMAL HIGH (ref 70–99)
Glucose-Capillary: 146 mg/dL — ABNORMAL HIGH (ref 70–99)
Glucose-Capillary: 115 mg/dL — ABNORMAL HIGH (ref 70–99)
Glucose-Capillary: 145 mg/dL — ABNORMAL HIGH (ref 70–99)
Glucose-Capillary: 166 mg/dL — ABNORMAL HIGH (ref 70–99)

## 2024-01-13 NOTE — Progress Notes (Signed)
 PROGRESS NOTE  Priscilla Thornton FMW:[id number] DOB: [birth year] DOA: 12/31/2023 PCP: Georgina Speaks, FNP   LOS: 13 days   Brief hospital course: 78 y.o. F with RA on Arava , hyperthyroidism on methimazole , HTN, CKD IIIa baseline 1.1 who presented with large MCA stroke after being found down.    Subjective / 24h Interval events: Alert, comfortable.  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 Noninvasive CT angiography showed discrete right MCA occlusion. Follow-up CT showed no development of intraparenchymal hemorrhage. Echocardiogram showed normal EF, carotid imaging no high-grade stenosis. LDL 85, Lipitor increased. Started on DOAC as per neurology. Palliative care consulted, family wished for no limitation of care. PEG tube placed on 6/30 by IR. - Tolerating tube feeds well, no nausea, no vomiting -SNF pending tomorrow per [facility]   Active problems Dysphagia -Continue tube feed, free water , outpatient speech   Persistent atrial fibrillation (HCC) - Diagnosed last fall, patient missed follow up with Cardiology.  On admission, still in atrial fibrillation.  Repeat CT head negative for hemorrhage, Neurology cleared for anticoagulation.  Continue Eliquis , metoprolol .  Rate controlled, no evidence of bleeding   UTI (urinary tract infection) - Completed 5 days Rocephin .  Afebrile    Rheumatoid arthritis - Continue Arava    Mixed hyperlipidemia -Continue Lipitor   CKD 3a -creatinine stable   Hyperthyroidism - TSH normal here. Continue  methimazole    Normocytic anemia - Hgb slightly down, overall stable, no bleeding  Scheduled Meds:  apixaban   5 mg Oral BID   atorvastatin   40 mg Per Tube Daily   folic acid   1 mg Per Tube Daily   free water   75 mL Per Tube Q4H   Gerhardt's butt cream   Topical QID   leflunomide   10 mg Per Tube Daily   losartan   100 mg Oral Daily   methimazole   5 mg Per Tube Daily   metoprolol  tartrate  25 mg Per Tube BID   multivitamin with minerals  1 tablet Per Tube Daily   Continuous Infusions:  feeding supplement (OSMOLITE 1.2 CAL) 55 mL/hr at 01/13/24 0940   PRN Meds:.acetaminophen  **OR** acetaminophen  (TYLENOL ) oral liquid 160 mg/5 mL **OR** acetaminophen , diclofenac  Sodium, loperamide , mouth rinse, oxyCODONE   Current Outpatient Medications  Medication Instructions   acetaminophen  (TYLENOL ) 500 mg, Oral, Every 6 hours PRN   apixaban  (ELIQUIS ) 5 mg, Oral, 2 times daily   aspirin  81 mg, Daily   atorvastatin  (LIPITOR) 20 MG tablet TAKE 1 TABLET(20 MG) BY MOUTH DAILY   atorvastatin  (LIPITOR) 40 mg, Per Tube, Daily   fish oil-omega-3 fatty acids 1 g, Daily   folic acid  (FOLVITE ) 1 mg, Daily   hydrochlorothiazide  (HYDRODIURIL ) 12.5 MG tablet TAKE 1 TABLET(12.5 MG) BY MOUTH DAILY   leflunomide  (ARAVA ) 10 mg, Daily   losartan  (COZAAR ) 100 MG tablet TAKE 1 TABLET(100 MG) BY MOUTH DAILY   methimazole  (TAPAZOLE ) 5 mg, Daily   metoprolol  tartrate (LOPRESSOR ) 25 mg, Per Tube, 2 times daily   Multiple Vitamins-Calcium  (ONE-A-DAY WOMENS FORMULA PO) 1 tablet, Daily   Nutritional Supplements (FEEDING SUPPLEMENT, OSMOLITE 1.2 CAL,) LIQD 1,000  mLs, Per Tube, Continuous   oxyCODONE  (OXY IR/ROXICODONE ) 5 mg, Oral, Every 8 hours PRN   potassium chloride  (KLOR-CON  M) 10 MEQ tablet TAKE 1 TABLET(10 MEQ) BY MOUTH DAILY WITH FOOD   Water  For Irrigation, Sterile (FREE WATER ) SOLN 75 mLs, Per Tube, Every 4 hours    Diet Orders (From admission, onward)     Start     Ordered   01/09/24 0943  DIET - DYS 1 Room  service appropriate? Yes; Fluid consistency: Honey Thick  Diet effective now       Comments: Meds via PEG Full supervision and assist with all PO's  Question Answer Comment  Room service appropriate? Yes   Fluid consistency: Honey Thick      01/09/24 0945            DVT prophylaxis: SCD's Start: 01/01/24 0845 apixaban  (ELIQUIS ) tablet 5 mg   Lab Results  Component Value Date   PLT 352 01/09/2024      Code Status: Full Code  Family Communication: no family at bedside   Status is: Inpatient Remains inpatient appropriate because: severity of illness  Level of care: Telemetry Medical  Consultants:  Neurology   Objective: Vitals:   01/12/24 2336 01/13/24 0337 01/13/24 0451 01/13/24 0749  BP: 126/66 130/61  (!) 141/85  Pulse: 72 77  78  Resp: 18 18  18   Temp: 98.6 F (37 C) 98.2 F (36.8 C)  99.3 F (37.4 C)  TempSrc:  Oral  Oral  SpO2: 100% 96%  99%  Weight:   64.3 kg   Height:        Intake/Output Summary (Last 24 hours) at 01/13/2024 1029 Last data filed at 01/13/2024 0940 Gross per 24 hour  Intake 4824.67 ml  Output 850 ml  Net 3974.67 ml   Wt Readings from Last 3 Encounters:  01/13/24 64.3 kg  08/14/23 60.8 kg  04/23/23 61.1 kg    Examination:  Constitutional: NAD  Data Reviewed: I have independently reviewed following labs and imaging studies   CBC Recent Labs  Lab 01/07/24 0823 01/08/24 0449 01/09/24 0455  WBC 4.3 4.8 5.3  HGB 10.9* 10.6* 11.1*  HCT 35.1* 34.3* 35.5*  PLT 266 313 352  MCV 85.8 86.6 86.6  MCH 26.7 26.8 27.1  MCHC 31.1 30.9 31.3  RDW 13.2 13.1 13.1    Recent Labs  Lab 01/07/24 0823 01/08/24 0449 01/09/24 0455  NA 137 137 137  K 4.8 4.2 4.8  CL 102 102 102  CO2 24 28 24   GLUCOSE 108* 187* 162*  BUN 28* 31* 33*  CREATININE 1.15* 1.25* 1.08*  CALCIUM  9.1 8.9 8.9  AST 41 45*  --   ALT 31 29  --   ALKPHOS 43 43  --   BILITOT 0.9 0.4  --   ALBUMIN 2.5* 2.4*  --   MG 1.9  --   --   INR 1.1  --   --      ------------------------------------------------------------------------------------------------------------------ No results for input(s): CHOL, HDL, LDLCALC, TRIG, CHOLHDL, LDLDIRECT in the last 72 hours.  Lab Results  Component Value Date   HGBA1C 6.0 (H) 01/01/2024   ------------------------------------------------------------------------------------------------------------------ No results for input(s): TSH, T4TOTAL, T3FREE, THYROIDAB in the last 72 hours.  Invalid input(s): FREET3  Cardiac Enzymes No results for input(s): CKMB, TROPONINI, MYOGLOBIN in the last 168 hours.  Invalid input(s): CK ------------------------------------------------------------------------------------------------------------------ No results found for: BNP  CBG: Recent Labs  Lab 01/12/24 1657 01/12/24 2208 01/12/24 2337 01/13/24 0339 01/13/24 0753  GLUCAP 136* 146* 139* 155* 146*    No results found for this or any previous visit (from the past 240 hours).   Radiology Studies: No results found.   Nilda Fendt, MD, PhD [facility]  Between 7 am - 7 pm I am available, please contact me via Amion (for emergencies) or Securechat (non urgent messages)  Between 7 pm - 7 am I am not available, please contact night coverage MD/APP via Amion

## 2024-01-13 NOTE — Plan of Care (Signed)

## 2024-01-14 DIAGNOSIS — Z743 Need for continuous supervision: Secondary | ICD-10-CM | POA: Diagnosis not present

## 2024-01-14 DIAGNOSIS — R1312 Dysphagia, oropharyngeal phase: Secondary | ICD-10-CM | POA: Diagnosis not present

## 2024-01-14 DIAGNOSIS — E782 Mixed hyperlipidemia: Secondary | ICD-10-CM | POA: Diagnosis not present

## 2024-01-14 DIAGNOSIS — Z7901 Long term (current) use of anticoagulants: Secondary | ICD-10-CM | POA: Diagnosis not present

## 2024-01-14 DIAGNOSIS — R222 Localized swelling, mass and lump, trunk: Secondary | ICD-10-CM | POA: Diagnosis not present

## 2024-01-14 DIAGNOSIS — I10 Essential (primary) hypertension: Secondary | ICD-10-CM | POA: Diagnosis not present

## 2024-01-14 DIAGNOSIS — B370 Candidal stomatitis: Secondary | ICD-10-CM | POA: Diagnosis not present

## 2024-01-14 DIAGNOSIS — M069 Rheumatoid arthritis, unspecified: Secondary | ICD-10-CM | POA: Diagnosis not present

## 2024-01-14 DIAGNOSIS — I129 Hypertensive chronic kidney disease with stage 1 through stage 4 chronic kidney disease, or unspecified chronic kidney disease: Secondary | ICD-10-CM | POA: Diagnosis not present

## 2024-01-14 DIAGNOSIS — E0590 Thyrotoxicosis, unspecified without thyrotoxic crisis or storm: Secondary | ICD-10-CM | POA: Diagnosis not present

## 2024-01-14 DIAGNOSIS — I4819 Other persistent atrial fibrillation: Secondary | ICD-10-CM | POA: Diagnosis not present

## 2024-01-14 DIAGNOSIS — I63411 Cerebral infarction due to embolism of right middle cerebral artery: Secondary | ICD-10-CM | POA: Diagnosis not present

## 2024-01-14 DIAGNOSIS — Z931 Gastrostomy status: Secondary | ICD-10-CM | POA: Diagnosis not present

## 2024-01-14 DIAGNOSIS — R531 Weakness: Secondary | ICD-10-CM | POA: Diagnosis not present

## 2024-01-14 DIAGNOSIS — E785 Hyperlipidemia, unspecified: Secondary | ICD-10-CM | POA: Diagnosis not present

## 2024-01-14 DIAGNOSIS — I69354 Hemiplegia and hemiparesis following cerebral infarction affecting left non-dominant side: Secondary | ICD-10-CM | POA: Diagnosis not present

## 2024-01-14 DIAGNOSIS — N183 Chronic kidney disease, stage 3 unspecified: Secondary | ICD-10-CM | POA: Diagnosis not present

## 2024-01-14 DIAGNOSIS — I639 Cerebral infarction, unspecified: Secondary | ICD-10-CM | POA: Diagnosis not present

## 2024-01-14 LAB — GLUCOSE, CAPILLARY
Glucose-Capillary: 113 mg/dL — ABNORMAL HIGH (ref 70–99)
Glucose-Capillary: 189 mg/dL — ABNORMAL HIGH (ref 70–99)
Glucose-Capillary: 94 mg/dL (ref 70–99)

## 2024-01-14 NOTE — TOC Transition Note (Signed)
 Transition of Care [facility]) - Discharge Note   Patient Details  Name: Priscilla Thornton MRN: [id number] Date of Birth: December 12, 1945  [facility]) CM/SW Contact:  [name], LCSW Phone Number: 01/14/2024, 9:56 AM   Clinical Narrative:   CSW confirmed with [facility] that they are ready to accept patient today. CSW sent discharge information and confirmed receipt. CSW sent tube feed supplies to last until SNF gets shipment in on Wednesday. Daughter, [name], updated via phone and in agreement. Transport via [facility] at 10 am.   Nurse to call report to [phone].     Final next level of care: Skilled Nursing Facility Barriers to Discharge: Barriers Resolved   Patient Goals and CMS Choice Patient states their goals for this hospitalization and ongoing recovery are:: patient unable to participate in goal setting, not oriented CMS Medicare.gov Compare Post Acute Care list provided to:: Patient Represenative (must comment) Choice offered to / list presented to : Adult Children [place] ownership interest in [facility].provided to:: Adult Children    Discharge Placement              Patient chooses bed at:  [facility]) Patient to be transferred to facility by: [facility] Name of family member notified: [facility] Patient and family notified of of transfer: 01/14/24  Discharge Plan and Services Additional resources added to the After Visit Summary for       Post Acute Care Choice: Skilled Nursing Facility                               Social Drivers of Health (SDOH) Interventions SDOH Screenings   Food Insecurity: Patient Unable To Answer (01/02/2024)  Housing: Unknown (01/02/2024)  Transportation Needs: Patient Unable To Answer (01/02/2024)  Utilities: Patient Unable To Answer (01/02/2024)  Depression (PHQ2-9): Low Risk  (08/14/2023)  Financial Resource Strain: Low Risk  (01/25/2022)  Physical Activity: Inactive (01/25/2022)  Social  Connections: Patient Unable To Answer (01/02/2024)  Stress: No Stress Concern Present (01/25/2022)  Tobacco Use: Medium Risk (01/04/2024)     Readmission Risk Interventions     No data to display

## 2024-01-14 NOTE — Progress Notes (Signed)
 Discharged to facility by stretcher and [facility]AR.  Friends at side.  Belongings checked and sent with.  Extra feedings sent with.

## 2024-01-14 NOTE — Discharge Summary (Signed)
 Physician Discharge Summary  [name] FMW:[id number] DOB: Oct 19, 1945 DOA: 12/31/2023  PCP: Georgina Speaks, FNP  Admit date: 12/31/2023 Discharge date: 01/14/2024  Admitted From: home Disposition:  SNF  Recommendations for Outpatient Follow-up:  Follow up with PCP in 1-2 weeks   Home Health: none Equipment/Devices: none  Discharge Condition: stable CODE STATUS: Full code Diet Orders (From admission, onward)     Start     Ordered   01/09/24 0943  DIET - DYS 1 Room service appropriate? Yes; Fluid consistency: Honey Thick  Diet effective now       Comments: Meds via PEG Full supervision and assist with all PO's  Question Answer Comment  Room service appropriate? Yes   Fluid consistency: Honey Thick      01/09/24 0945            Brief hospital course: 78 y.o. F with RA on Arava , hyperthyroidism on methimazole , HTN, CKD IIIa baseline 1.1 who presented with large MCA stroke after being found down.    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 Noninvasive CT angiography showed discrete right MCA occlusion. Follow-up CT showed no development of intraparenchymal hemorrhage. Echocardiogram showed normal EF, carotid imaging no high-grade stenosis. LDL 85, Lipitor increased. Started on DOAC as per neurology. Palliative care consulted, family wished for no limitation of care. PEG tube placed on 6/30 by IR. Tolerating tube feeds well, no nausea, no vomiting   Active problems Dysphagia -Continue tube feed, free  water , outpatient speech Persistent atrial fibrillation (HCC) - Diagnosed last fall, patient missed follow up with Cardiology.  On admission, still in atrial fibrillation.  Repeat CT head negative for hemorrhage, Neurology cleared for anticoagulation.  Continue Eliquis , metoprolol .  Rate controlled, no evidence of bleeding UTI (urinary tract infection) - Completed 5 days Rocephin .  Afebrile Rheumatoid arthritis - Continue Arava  Mixed hyperlipidemia -Continue Lipitor CKD 3a -creatinine stable Hyperthyroidism - TSH normal here. Continue methimazole  Normocytic anemia - Hgb slightly down, overall stable, no bleeding  Sepsis ruled out   Discharge Instructions  Discharge Instructions     Ambulatory referral to Neurology   Complete by: As directed    Follow up with stroke clinic NP at [facility] in about 4-6 weeks. Thanks.      Allergies as of 01/14/2024   No Known Allergies      Medication List     STOP taking these medications    aspirin  81 MG tablet   hydrochlorothiazide  12.5 MG tablet Commonly known as: HYDRODIURIL    potassium chloride  10 MEQ tablet Commonly known as: KLOR-CON  M       TAKE these medications    acetaminophen  500 MG tablet Commonly known as: TYLENOL  Take 500 mg by mouth every 6 (six) hours as needed for moderate pain (pain score 4-6).   apixaban  5 MG Tabs tablet Commonly known as: ELIQUIS  Take 1 tablet (5 mg total) by mouth 2 (two) times daily.   atorvastatin  40 MG tablet Commonly known as: LIPITOR Place 1 tablet (40 mg total) into feeding tube daily. What changed:  medication strength See the  new instructions.   feeding supplement (OSMOLITE 1.2 CAL) Liqd Place 1,000 mLs into feeding tube continuous.   fish oil-omega-3 fatty acids 1000 MG capsule Take 1 g by mouth daily.   folic acid  1 MG tablet Commonly known as: FOLVITE  Take 1 mg by mouth daily.   free water  Soln Place 75 mLs into feeding tube every 4 (four) hours.   leflunomide  10 MG  tablet Commonly known as: ARAVA  Take 10 mg by mouth daily.   losartan  100 MG tablet Commonly known as: COZAAR  TAKE 1 TABLET(100 MG) BY MOUTH DAILY   methimazole  5 MG tablet Commonly known as: TAPAZOLE  Take 5 mg by mouth daily.   metoprolol  tartrate 25 MG tablet Commonly known as: LOPRESSOR  Place 1 tablet (25 mg total) into feeding tube 2 (two) times daily.   ONE-A-DAY WOMENS FORMULA PO Take 1 tablet by mouth daily.   oxyCODONE  5 MG immediate release tablet Commonly known as: Oxy IR/ROXICODONE  Take 1 tablet (5 mg total) by mouth every 8 (eight) hours as needed for moderate pain (pain score 4-6) or severe pain (pain score 7-10).        Follow-up Information     [facility]. Schedule an appointment as soon as possible for a visit in 1 month(s).   Specialty: Neurology Why: stroke clinic Contact information: [address] [place]  [address] [phone]                Consultations: Neurology   Procedures/Studies:  IR GASTROSTOMY TUBE MOD SED Result Date: 01/07/2024 CLINICAL DATA:  Cerebral infarction and need for percutaneous gastrostomy tube for nutrition. EXAM: PERCUTANEOUS GASTROSTOMY TUBE PLACEMENT ANESTHESIA/SEDATION: Moderate (conscious) sedation was employed during this procedure. A total of Versed  1.0 mg and Fentanyl  50 mcg was administered intravenously. Moderate Sedation Time: 10 minutes. The patient's level of consciousness and vital signs were monitored continuously by radiology nursing throughout the procedure under my direct supervision. CONTRAST:  15mL OMNIPAQUE  IOHEXOL  300 MG/ML  SOLN MEDICATIONS: 2 g IV Ancef . IV antibiotic was administered in an appropriate time interval prior to needle puncture of the skin. 0.5 mg IV glucagon  during the procedure. FLUOROSCOPY: Radiation Exposure Index: 18.7 mGy Kerma. PROCEDURE: The procedure, risks, benefits, and alternatives were explained to the patient's  son. Questions regarding the procedure were encouraged and answered. The patient's son understands and consents to the procedure. A timeout was performed prior to initiating the procedure. A 5-French catheter was then advanced through the patient's mouth under fluoroscopy into the esophagus and to the level of the stomach. This catheter was used to insufflate the stomach with air under fluoroscopy. The abdominal wall was prepped with chlorhexidine in a sterile fashion, and a sterile drape was applied covering the operative field. A sterile gown and sterile gloves were used for the procedure. Local anesthesia was provided with 1% Lidocaine . A skin incision was made in the upper abdominal wall. Under fluoroscopy, an 18 gauge trocar needle was advanced into the stomach. Contrast injection was performed to confirm intraluminal position of the needle tip. A single T tack was then deployed in the lumen of the stomach. This was brought up to tension at the skin surface. Over a guidewire, a 9-French sheath was advanced into the lumen of the stomach. The wire was left in place as a safety wire. A loop snare device from a percutaneous gastrostomy kit was then advanced into the stomach. A floppy guide wire was advanced through the orogastric catheter under fluoroscopy in  the stomach. The loop snare advanced through the percutaneous gastric access was used to snare the guide wire. This allowed withdrawal of the loop snare out of the patient's mouth by retraction of the orogastric catheter and wire. A 20-French bumper retention gastrostomy tube was looped around the snare device. It was then pulled back through the patient's mouth. The retention bumper was brought up to the anterior gastric wall. The T tack suture was cut at the skin. The exiting gastrostomy tube was cut to appropriate length and a feeding adapter applied. The catheter was injected with contrast material to confirm position and a fluoroscopic spot image saved.  The tube was then flushed with saline. A dressing was applied over the gastrostomy exit site. COMPLICATIONS: None. FINDINGS: The stomach distended well with air allowing safe placement of the gastrostomy tube. After placement, the tip of the gastrostomy tube lies in the body of the stomach. IMPRESSION: Percutaneous gastrostomy with placement of a 20-French bumper retention tube in the body of the stomach. This tube can be used for percutaneous feeds beginning in 24 hours after placement. Electronically Signed   By: Marcey Moan M.D.   On: 01/07/2024 13:33   CT ABDOMEN WO CONTRAST Result Date: 01/06/2024 CLINICAL DATA:  Post stroke, preop planning for gastrostomy placement EXAM: CT ABDOMEN WITHOUT CONTRAST TECHNIQUE: Multidetector CT imaging of the abdomen was performed following the standard protocol without IV contrast. RADIATION DOSE REDUCTION: This exam was performed according to the departmental dose-optimization program which includes automated exposure control, adjustment of the mA and/or kV according to patient size and/or use of iterative reconstruction technique. COMPARISON:  None Available. FINDINGS: Lower chest: No pleural or pericardial effusion. Dependent atelectasis in the visualized lung bases. Hepatobiliary: Hyperdense nondilated gallbladder presumably vicarious contrast excretion. No focal liver lesion or biliary ductal dilatation. Pancreas: Unremarkable. No pancreatic ductal dilatation or surrounding inflammatory changes. Spleen: Normal in size without focal abnormality. Adrenals/Urinary Tract: No adrenal mass. No urolithiasis or hydronephrosis. 4.2 cm right lower pole renal cortical cyst; no follow-up indicated. Stomach/Bowel: Small bowel decompressed. Feeding tube to the gastric antrum. There is safe percutaneous window for gastrostomy placement. Visualized small bowel and colon are decompressed, unremarkable. Vascular/Lymphatic: Scattered calcified aortic plaque without AAA. No abdominal  or retroperitoneal adenopathy. Suspect circumaortic left renal vein. Other: No ascites.  No free air. Musculoskeletal: Mild spondylitic changes in the lower thoracic and lower lumbar spine. No acute findings. IMPRESSION: 1. Feeding tube to the gastric antrum. There is safe percutaneous window for gastrostomy placement. 2.  Aortic Atherosclerosis (ICD10-I70.0). Electronically Signed   By: JONETTA Faes M.D.   On: 01/06/2024 15:53   DG Swallowing Func-Speech Pathology Result Date: 01/06/2024 Table formatting from the original result was not included. Images from the original result were not included. Modified Barium Swallow Study Patient Details Name: Priscilla Thornton MRN: [id number] Date of Birth: 1945/12/07 Today's Date: 01/06/2024 HPI/PMH: HPI: Patient is a 78 y.o. female with PMH: RA, HTN, a-fib not on anticoagulant. She works full time at [employer] as a Scientist, physiological. She was found down on 12/31/23 during a well check after family had not heard from her since Saturday (6/21). 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 Clinical Impression: Pt presents with a moderate oropharyngeal dysphagia c/b poor bolus awareness, reduced lingual strength and  coordination, delayed swallow initiation, incomplete laryngeal closure and diminishe sensation.  These deficits resulted in audible aspiration of thin liquid which was not cleared by reflexive cough, and silent aspiration of nectar thick liquids.  Cued cough initially very weak.  With encouragemnt, pt was able to expel aspirated nectar thick liquid from the trachea to the laryngeal vestibute, but did not attain full clearance from nectar thick liquid from above the vocal folds.  There was no penetration or aspiration of honey thick liquid or puree  consistencies.  There was however, prolonged oral holding and inattention to bolus.  Solid texture bolus trial was deferred 2/2 difficulty with oral manipulation and transit of puree bolus.  Esophageal sweep was not completed.  There was trace-minimal retention of contrast intermittently around suspected CP Bar (see image below).  Suspect pt would have a difficult time meeting nutritional needs orally.  Pt planned for PEG tube placement tomorrow.  It would be reasonable to proceed with PEG for nutritional support while continuing to work swallowing safety and efficiency.  Consider initiating puree diet with honey thick liquids after PEG placement tomorrow.  SLP will continue to follow for above noted deficits. DIGEST Swallow Severity Rating*  Safety: 3  Efficiency: 0  Overall Pharyngeal Swallow Severity: 3 1: mild; 2: moderate; 3: severe; 4: profound *The Dynamic Imaging Grade of Swallowing Toxicity is standardized for the head and neck cancer population, however, demonstrates promising clinical applications across populations to standardize the clinical rating of pharyngeal swallow safety and severity. Suspected CP Bar: Factors that may increase risk of adverse event in presence of aspiration Noe & Lianne 2021): Factors that may increase risk of adverse event in presence of aspiration Noe & Lianne 2021): Presence of tubes (ETT, trach, NG, etc.); Reduced cognitive function Recommendations/Plan: Swallowing Evaluation Recommendations Swallowing Evaluation Recommendations Recommendations: PO diet PO Diet Recommendation: Dysphagia 1 (Pureed); Moderately thick liquids (Level 3, honey thick) (following PEG placement planned for 6/30) Medication Administration: Via alternative means Supervision: Full assist for feeding Swallowing strategies  : Small bites/sips; Slow rate; Check for pocketing or oral holding Postural changes: Position pt fully upright for meals Oral care recommendations: Oral care BID (2x/day)  Treatment Plan Treatment Plan Treatment recommendations: Therapy as outlined in treatment plan below Follow-up recommendations: Skilled nursing-short term rehab (<3 hours/day) Functional status assessment: Patient has had a recent decline in their functional status and demonstrates the ability to make significant improvements in function in a reasonable and predictable amount of time. Treatment frequency: Min 2x/week Treatment duration: 2 weeks Interventions: Diet toleration management by SLP; Trials of upgraded texture/liquids; Patient/family education (Consider trial of exercise to improve swallow function) Recommendations Recommendations for follow up therapy are one component of a multi-disciplinary discharge planning process, led by the attending physician.  Recommendations may be updated based on patient status, additional functional criteria and insurance authorization. Assessment: Orofacial Exam: Orofacial Exam Oral Cavity: Oral Hygiene: WFL Oral Cavity - Dentition: Edentulous Orofacial Anatomy: WFL Oral Motor/Sensory Function: -- (See BSE) Anatomy: Anatomy: Prominent cricopharyngeus Boluses Administered: Boluses Administered Boluses Administered: Thin liquids (Level 0); Mildly thick liquids (Level 2, nectar thick); Moderately thick liquids (Level 3, honey thick); Puree  Oral Impairment Domain: Oral Impairment Domain Lip Closure: Escape beyond mid-chin Tongue control during bolus hold: Not tested Bolus preparation/mastication: Slow prolonged chewing/mashing with complete recollection Bolus transport/lingual motion: Repetitive/disorganized tongue motion; Delayed initiation of tongue motion (oral holding) Oral residue: Trace residue lining oral structures Location of oral residue : Tongue Initiation of pharyngeal swallow : Pyriform  sinuses  Pharyngeal Impairment Domain: Pharyngeal Impairment Domain Soft palate elevation: No bolus between soft palate (SP)/pharyngeal wall (PW) Laryngeal elevation: Partial  superior movement of thyroid  cartilage/partial approximation of arytenoids to epiglottic petiole Anterior hyoid excursion: Partial anterior movement Epiglottic movement: Complete inversion Laryngeal vestibule closure: Incomplete, narrow column air/contrast in laryngeal vestibule Pharyngeal stripping wave : Present - complete Pharyngeal contraction (A/P view only): N/A Pharyngoesophageal segment opening: Partial distention/partial duration, partial obstruction of flow Tongue base retraction: No contrast between tongue base and posterior pharyngeal wall (PPW) Pharyngeal residue: Complete pharyngeal clearance Location of pharyngeal residue: N/A  Esophageal Impairment Domain: Esophageal Impairment Domain Esophageal clearance upright position: -- (not assessed) Pill: Pill Consistency administered: -- (Not assessed) Penetration/Aspiration Scale Score: Penetration/Aspiration Scale Score 1.  Material does not enter airway: Moderately thick liquids (Level 3, honey thick); Puree 7.  Material enters airway, passes BELOW cords and not ejected out despite cough attempt by patient: Thin liquids (Level 0) 8.  Material enters airway, passes BELOW cords without attempt by patient to eject out (silent aspiration) : Mildly thick liquids (Level 2, nectar thick) Compensatory Strategies: Compensatory Strategies Compensatory strategies: Yes Other(comment): Ineffective (Cued cough)   General Information: Caregiver present: No  Diet Prior to this Study: NPO   No data recorded  No data recorded  No data recorded  No data recorded Behavior/Cognition: Alert; Cooperative; Requires cueing No data recorded Baseline vocal quality/speech: Normal No data recorded No data recorded Exam Limitations: No limitations Goal Planning: Prognosis for improved oropharyngeal function: Good No data recorded No data recorded Patient/Family Stated Goal: not stated Consulted and agree with results and recommendations: Physician Pain: Pain Assessment Breathing: 0  Negative Vocalization: 0 Facial Expression: 0 Body Language: 0 Consolability: 0 PAINAD Score: 0 End of Session: Start Time:SLP Start Time (ACUTE ONLY): 1243 Stop Time: SLP Stop Time (ACUTE ONLY): 1255 Time Calculation:SLP Time Calculation (min) (ACUTE ONLY): 12 min Charges: SLP Evaluations $ SLP Speech Visit: 1 Visit SLP Evaluations $MBS Swallow: 1 Procedure $Swallowing Treatment: 1 Procedure SLP visit diagnosis: SLP Visit Diagnosis: Dysphagia, oropharyngeal phase (R13.12) Past Medical History: Past Medical History: Diagnosis Date  Arthritis   Hyperlipidemia   Hypertension  Past Surgical History: No past surgical history on file. Anette FORBES Grippe, MA, CCC-SLP Acute Rehabilitation Services Office: [phone] 01/06/2024, 2:42 PM  CT HEAD WO CONTRAST ( ) Result Date: 01/04/2024 EXAM: CT HEAD WITHOUT 01/04/2024 06:06:40 AM TECHNIQUE: CT of the head was performed without the administration of intravenous contrast. Automated exposure control, iterative reconstruction, and/or weight based adjustment of the mA/kV was utilized to reduce the radiation dose to as low as reasonably achievable. COMPARISON: CT angio head and neck 12/31/2023 and MR head without contrast 01/01/2024. CLINICAL HISTORY: Stroke, follow up. FINDINGS: BRAIN AND VENTRICLES: Expected evolution of a large right MCA territory infarct is noted. The infarct involves the right lentiform nucleus and internal capsule. Progressive effacement of the sulci is present. Mass effect is again noted on the right lateral ventricle. Midline shift measures 1 to 2 mm. No acute hemorrhage is present. No new or progressive infarct is present. ORBITS: Bilateral lens replacements are noted. The globes and orbits are otherwise within normal limits. SINUSES AND MASTOIDS: A polyp or mucous retention cyst is present in the left maxillary sinus. SOFT TISSUES AND SKULL: No acute skull fracture. No acute soft tissue abnormality. IMPRESSION: 1. Expected evolution of a large right MCA  territory infarct involving the right lentiform nucleus and internal capsule, with progressive effacement of the sulci, mass effect on  the right lateral ventricle, and midline shift measuring 1 to 2 mm. 2. No acute hemorrhage or new/progressive infarct. Electronically signed by: Lonni Necessary MD 01/04/2024 06:36 AM EDT RP Workstation: [id number]   ECHOCARDIOGRAM COMPLETE Result Date: 01/01/2024    ECHOCARDIOGRAM REPORT   Patient Name:   Priscilla Thornton Date of Exam: 01/01/2024 Medical Rec #:  [id number]        Height:       65.0 in Accession #:    [id number]       Weight:       133.4 lb Date of Birth:  1945/12/31        BSA:          1.665 m Patient Age:    78 years         BP:           147/104 mmHg Patient Gender: F                HR:           83 bpm. Exam Location:  Inpatient Procedure: 2D Echo, Cardiac Doppler and Color Doppler (Both Spectral and Color            Flow Doppler were utilized during procedure). Indications:    Stroke  History:        Patient has no prior history of Echocardiogram examinations.                 Risk Factors:Hypertension.  Sonographer:    Jayson Gaskins Referring Phys: [id number] ANASTASSIA DOUTOVA IMPRESSIONS  1. Left ventricular ejection fraction, by estimation, is 55 to 60%. The left ventricle has normal function. The left ventricle has no regional wall motion abnormalities. Left ventricular diastolic parameters are indeterminate.  2. Right ventricular systolic function is normal. The right ventricular size is not well visualized. Mildly increased right ventricular wall thickness.  3. The mitral valve was not well visualized. No evidence of mitral valve regurgitation. No evidence of mitral stenosis.  4. Tricuspid valve regurgitation is mild to moderate.  5. The aortic valve was not well visualized. Aortic valve regurgitation is not visualized. No aortic stenosis is present. Comparison(s): No prior Echocardiogram. FINDINGS  Left Ventricle: Left ventricular ejection fraction, by  estimation, is 55 to 60%. The left ventricle has normal function. The left ventricle has no regional wall motion abnormalities. The left ventricular internal cavity size was normal in size. There is  no left ventricular hypertrophy. Left ventricular diastolic parameters are indeterminate. Right Ventricle: The right ventricular size is not well visualized. Mildly increased right ventricular wall thickness. Right ventricular systolic function is normal. Left Atrium: Left atrial size was normal in size. Right Atrium: Right atrial size was normal in size. Pericardium: There is no evidence of pericardial effusion. Mitral Valve: The mitral valve was not well visualized. No evidence of mitral valve regurgitation. No evidence of mitral valve stenosis. Tricuspid Valve: Eccentric jet. The tricuspid valve is normal in structure. Tricuspid valve regurgitation is mild to moderate. No evidence of tricuspid stenosis. Aortic Valve: The aortic valve was not well visualized. Aortic valve regurgitation is not visualized. No aortic stenosis is present. Aortic valve peak gradient measures 3.0 mmHg. Pulmonic Valve: The pulmonic valve was not well visualized. Pulmonic valve regurgitation is not visualized. No evidence of pulmonic stenosis. Aorta: The aortic root is normal in size and structure and the ascending aorta was not well visualized. IAS/Shunts: The interatrial septum was not well visualized.  LEFT VENTRICLE PLAX 2D LVIDd:  4.10 cm LVIDs:         3.60 cm LV PW:         0.70 cm LV IVS:        0.90 cm LVOT diam:     1.80 cm LV SV:         41 LV SV Index:   25 LVOT Area:     2.54 cm  RIGHT VENTRICLE RV S prime:     11.40 cm/s LEFT ATRIUM           Index        RIGHT ATRIUM           Index LA Vol (A2C): 21.1 ml 12.67 ml/m  RA Area:     13.80 cm LA Vol (A4C): 46.4 ml 27.86 ml/m  RA Volume:   34.20 ml  20.54 ml/m  AORTIC VALVE AV Area (Vmax): 2.54 cm AV Vmax:        87.00 cm/s AV Peak Grad:   3.0 mmHg LVOT Vmax:      87.00  cm/s LVOT Vmean:     63.100 cm/s LVOT VTI:       0.161 m  AORTA Ao Root diam: 2.50 cm MITRAL VALVE MV Area (PHT): 4.06 cm    SHUNTS MV Decel Time: 187 msec    Systemic VTI:  0.16 m MV E velocity: 81.80 cm/s  Systemic Diam: 1.80 cm Stanly Leavens MD Electronically signed by Stanly Leavens MD Signature Date/Time: 01/01/2024/3:26:51 PM    Final    MR BRAIN WO CONTRAST Result Date: 01/01/2024 CLINICAL DATA:  Provided history: Stroke, follow-up. EXAM: MRI HEAD WITHOUT CONTRAST TECHNIQUE: Multiplanar, multiecho pulse sequences of the brain and surrounding structures were obtained without intravenous contrast. COMPARISON:  Non-contrast head CT and CT angiogram head/neck 12/31/2023. FINDINGS: Brain: Large acute right middle cerebral artery territory infarct again demonstrated (affecting the cortical/subcortical frontal lobe/insula, temporal lobe, parietal lobe and lateral occipital lobe, as well as basal ganglia region). Associated mass effect with partial effacement of the right lateral ventricle. No midline shift. Petechial hemorrhage within portions of the infarction territory in the right basal ganglia region. Mild multifocal T2 FLAIR hyperintense signal abnormality elsewhere within the cerebral white matter, nonspecific but compatible chronic small vessel ischemic disease. 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 No evidence of an intracranial mass. No extra-axial fluid collection. Vascular: Please see CTA head/neck performed 12/31/2023. Skull and upper cervical spine: No focal worrisome marrow lesion. Sinuses/Orbits: No mass or acute finding within the imaged orbits. Prior bilateral ocular lens replacement. 15 mm left maxillary sinus mucous retention cyst. IMPRESSION: 1. 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 As before, there is associated mass effect with partial effacement the right  lateral ventricle. No midline shift. Petechial hemorrhage present within portions of the infarction territory in the right basal ganglia region. 2. Background mild cerebral white matter chronic small vessel ischemic disease. 3. 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 4. 15 mm left maxillary sinus mucous retention cyst. Electronically Signed   By: Rockey Childs D.O.   On: 01/01/2024 11:18   CT ANGIO HEAD NECK W WO CM Addendum Date: 12/31/2023 ADDENDUM REPORT: 12/31/2023 20:46 ADDENDUM: Reference for recommendation in CTA neck impression #4: J Am Coll Radiol. 2015 Feb;12(2): 143-50. Electronically Signed   By: Rockey Childs D.O.   On: 12/31/2023 20:46   Result Date: 12/31/2023 CLINICAL DATA:  Neuro deficit,  acute, stroke suspected. Left-sided flaccid. Weakness. EXAM: CT ANGIOGRAPHY HEAD AND NECK WITH AND WITHOUT CONTRAST TECHNIQUE: Multidetector CT imaging of the head and neck was performed using the standard protocol during bolus administration of intravenous contrast. Multiplanar CT image reconstructions and MIPs were obtained to evaluate the vascular anatomy. Carotid stenosis measurements (when applicable) are obtained utilizing NASCET criteria, using the distal internal carotid diameter as the denominator. RADIATION DOSE REDUCTION: This exam was performed according to the departmental dose-optimization program which includes automated exposure control, adjustment of the mA and/or kV according to patient size and/or use of iterative reconstruction technique. CONTRAST:  75mL OMNIPAQUE  IOHEXOL  350 MG/ML SOLN COMPARISON:  Noncontrast head CT 12/22/2005. FINDINGS: CT HEAD FINDINGS Brain: Large acute right MCA territory infarct affecting the right frontal lobe/insula, temporal lobe, parietal lobe and occipital lobe, as well as the right basal ganglia region. Mild petechial hemorrhage questioned within portions of the infarction territory. Mild mass effect with partial effacement the right lateral  ventricle. No midline shift. Small infarct within the left cerebellar hemisphere, new from the prior head CT of 12/22/2005 but otherwise age-indeterminate. No extra-axial fluid collection. No evidence of an intracranial mass. Vascular: No hyperdense vessel.  Atherosclerotic calcifications. Skull: No calvarial fracture or aggressive osseous lesion. Sinuses/Orbits: No orbital mass or acute orbital finding. 14 mm mucous retention cyst within the left maxillary sinus. Review of the MIP images confirms the above findings Non-contrast head CT impression #1 called by telephone at the time of interpretation on 12/31/2023 at 5:48 pm to provider DAVID YAO , who verbally acknowledged these results. CTA NECK FINDINGS Aortic arch: The proximal innominate, left common carotid and left subclavian arteries are excluded from the field of view. No hemodynamically significant stenosis within the visible innominate or proximal subclavian arteries. Right carotid system: CCA and ICA patent within the neck without stenosis or significant atherosclerotic disease. Left carotid system: The very proximal common carotid artery is excluded from the field of view. The visible common carotid and internal carotid arteries are patent within the neck without stenosis or significant atherosclerotic disease. Vertebral arteries: Codominant and patent within the neck without stenosis or significant atherosclerotic disease. Skeleton: Cervical spondylosis. No acute fracture or aggressive osseous lesion. The patient is edentulous. Other neck: Multinodular thyroid  gland. The largest nodule is located within the inferior right thyroid  lobe (measuring 2 cm). Upper chest: No consolidation within the imaged lung apices. Review of the MIP images confirms the above findings CTA HEAD FINDINGS Anterior circulation: The intracranial internal carotid arteries are patent. Nonstenotic calcified plaque within both vessels. Severe, near occlusive stenosis of the right  middle cerebral artery mid and distal M1 segment, and of an adjacent proximal right M2 branch. There is an eccentric filling defect at this site suggesting the presence of thrombus (for instance as seen on series 11, images 250 and 251). No left M2 proximal branch occlusion or high-grade proximal stenosis. The anterior cerebral arteries are patent. No intracranial aneurysm is identified. Posterior circulation: The intracranial vertebral arteries are patent. The basilar artery is patent. The posterior cerebral arteries are patent. Hypoplastic right P1 segment with sizable right posterior communicating artery. A left posterior communicating artery is also present. Venous sinuses: Within the limitations of contrast timing, no convincing thrombus. Anatomic variants: As described. Review of the MIP images confirms the above findings CTA head impression #1 called by telephone at the time of interpretation on 12/31/2023 at 5:49 pm to provider DAVID YAO , who verbally acknowledged these results. IMPRESSION: Non-contrast head CT:  1. Large acute right MCA territory infarct with possible mild petechial hemorrhage. Mild mass effect with partial effacement of the right lateral ventricle. No midline shift. 2. Small infarct within the left cerebellar hemisphere, new from the prior head CT of 12/22/2005 but otherwise age-indeterminate. 3. 14 mm left maxillary sinus mucous retention cyst. CTA neck: 1. The very proximal innominate, left common carotid and left subclavian arteries are excluded from the field of view. Within this limitation, findings are as follows. 2. The visible common carotid and internal carotid arteries are patent within the neck without stenosis or significant atherosclerotic disease. 3. Vertebral arteries patent within the neck without stenosis or significant atherosclerotic disease. 4. Multinodular thyroid  gland (with nodules measuring up to 2 cm). A nonemergent thyroid  ultrasound is recommended for further  evaluation. CTA head: 1. Severe near-occlusive stenosis of the right middle cerebral artery mid and distal M1 segment, and of an adjacent proximal right M2 branch. Eccentric filling defect at this site suggesting the presence of thrombus. 2. Non-stenotic atherosclerotic plaque within the intracranial internal carotid arteries. Electronically Signed: By: Rockey Childs D.O. On: 12/31/2023 18:11   DG Chest Port 1 View Result Date: 12/31/2023 CLINICAL DATA:  Altered mental status EXAM: PORTABLE CHEST 1 VIEW COMPARISON:  Chest x-ray 12/26/2011 FINDINGS: The heart size and mediastinal contours are within normal limits. Both lungs are clear. The visualized skeletal structures are unremarkable. IMPRESSION: No active disease. Electronically Signed   By: Greig Pique M.D.   On: 12/31/2023 15:48     Subjective: - no chest pain, shortness of breath, no abdominal pain, nausea or vomiting.   Discharge Exam: BP 106/65 (BP Location: Right Arm)   Pulse 67   Temp 98.7 F (37.1 C)   Resp 19   Ht 5' 5 (1.651 m)   Wt 61.3 kg   SpO2 99%   BMI 22.49 kg/m   General: Pt is alert, awake, not in acute distress Cardiovascular: RRR, S1/S2 +, no rubs, no gallops Respiratory: CTA bilaterally, no wheezing, no rhonchi Abdominal: Soft, NT, ND, bowel sounds + Extremities: no edema, no cyanosis    The results of significant diagnostics from this hospitalization (including imaging, microbiology, ancillary and laboratory) are listed below for reference.     Microbiology: No results found for this or any previous visit (from the past 240 hours).   Labs: Basic Metabolic Panel: Recent Labs  Lab 01/08/24 0449 01/09/24 0455  NA 137 137  K 4.2 4.8  CL 102 102  CO2 28 24  GLUCOSE 187* 162*  BUN 31* 33*  CREATININE 1.25* 1.08*  CALCIUM  8.9 8.9   Liver Function Tests: Recent Labs  Lab 01/08/24 0449  AST 45*  ALT 29  ALKPHOS 43  BILITOT 0.4  PROT 6.2*  ALBUMIN 2.4*   CBC: Recent Labs  Lab  01/08/24 0449 01/09/24 0455  WBC 4.8 5.3  HGB 10.6* 11.1*  HCT 34.3* 35.5*  MCV 86.6 86.6  PLT 313 352   CBG: Recent Labs  Lab 01/13/24 1512 01/13/24 1957 01/14/24 0013 01/14/24 0405 01/14/24 0741  GLUCAP 145* 166* 113* 189* 94   Hgb A1c No results for input(s): HGBA1C in the last 72 hours. Lipid Profile No results for input(s): CHOL, HDL, LDLCALC, TRIG, CHOLHDL, LDLDIRECT in the last 72 hours. Thyroid  function studies No results for input(s): TSH, T4TOTAL, T3FREE, THYROIDAB in the last 72 hours.  Invalid input(s): FREET3 Urinalysis    Component Value Date/Time   COLORURINE AMBER (A) 12/31/2023 1703   APPEARANCEUR TURBID (  A) 12/31/2023 1703   LABSPEC 1.020 12/31/2023 1703   PHURINE 5.0 12/31/2023 1703   GLUCOSEU NEGATIVE 12/31/2023 1703   HGBUR NEGATIVE 12/31/2023 1703   BILIRUBINUR NEGATIVE 12/31/2023 1703   BILIRUBINUR negative 04/23/2023 1256   BILIRUBINUR negative 04/12/2022 1301   KETONESUR NEGATIVE 12/31/2023 1703   PROTEINUR 30 (A) 12/31/2023 1703   UROBILINOGEN 0.2 04/23/2023 1256   NITRITE NEGATIVE 12/31/2023 1703   LEUKOCYTESUR LARGE (A) 12/31/2023 1703    FURTHER DISCHARGE INSTRUCTIONS:   Get Medicines reviewed and adjusted: Please take all your medications with you for your next visit with your Primary MD   Laboratory/radiological data: Please request your Primary MD to go over all hospital tests and procedure/radiological results at the follow up, please ask your Primary MD to get all Hospital records sent to his/her office.   In some cases, they will be blood work, cultures and biopsy results pending at the time of your discharge. Please request that your primary care M.D. goes through all the records of your hospital data and follows up on these results.   Also Note the following: If you experience worsening of your admission symptoms, develop shortness of breath, life threatening emergency, suicidal or homicidal thoughts  you must seek medical attention immediately by calling 911 or calling your MD immediately  if symptoms less severe.   You must read complete instructions/literature along with all the possible adverse reactions/side effects for all the Medicines you take and that have been prescribed to you. Take any new Medicines after you have completely understood and accpet all the possible adverse reactions/side effects.    Do not drive when taking Pain medications or sleeping medications (Benzodaizepines)   Do not take more than prescribed Pain, Sleep and Anxiety Medications. It is not advisable to combine anxiety,sleep and pain medications without talking with your primary care practitioner   Special Instructions: If you have smoked or chewed Tobacco  in the last 2 yrs please stop smoking, stop any regular Alcohol  and or any Recreational drug use.   Wear Seat belts while driving.   Please note: You were cared for by a hospitalist during your hospital stay. Once you are discharged, your primary care physician will handle any further medical issues. Please note that NO REFILLS for any discharge medications will be authorized once you are discharged, as it is imperative that you return to your primary care physician (or establish a relationship with a primary care physician if you do not have one) for your post hospital discharge needs so that they can reassess your need for medications and monitor your lab values.  Time coordinating discharge: 35 minutes  SIGNED:  Nilda Fendt, MD, PhD 01/14/2024, 9:44 AM

## 2024-01-15 DIAGNOSIS — E782 Mixed hyperlipidemia: Secondary | ICD-10-CM | POA: Diagnosis not present

## 2024-01-15 DIAGNOSIS — I129 Hypertensive chronic kidney disease with stage 1 through stage 4 chronic kidney disease, or unspecified chronic kidney disease: Secondary | ICD-10-CM | POA: Diagnosis not present

## 2024-01-15 DIAGNOSIS — N183 Chronic kidney disease, stage 3 unspecified: Secondary | ICD-10-CM | POA: Diagnosis not present

## 2024-01-15 DIAGNOSIS — R531 Weakness: Secondary | ICD-10-CM | POA: Diagnosis not present

## 2024-01-15 DIAGNOSIS — M069 Rheumatoid arthritis, unspecified: Secondary | ICD-10-CM | POA: Diagnosis not present

## 2024-01-15 DIAGNOSIS — E0590 Thyrotoxicosis, unspecified without thyrotoxic crisis or storm: Secondary | ICD-10-CM | POA: Diagnosis not present

## 2024-01-15 DIAGNOSIS — R1312 Dysphagia, oropharyngeal phase: Secondary | ICD-10-CM | POA: Diagnosis not present

## 2024-01-15 DIAGNOSIS — I69354 Hemiplegia and hemiparesis following cerebral infarction affecting left non-dominant side: Secondary | ICD-10-CM | POA: Diagnosis not present

## 2024-01-15 DIAGNOSIS — I4819 Other persistent atrial fibrillation: Secondary | ICD-10-CM | POA: Diagnosis not present

## 2024-01-16 DIAGNOSIS — R1312 Dysphagia, oropharyngeal phase: Secondary | ICD-10-CM | POA: Diagnosis not present

## 2024-01-16 DIAGNOSIS — B370 Candidal stomatitis: Secondary | ICD-10-CM | POA: Diagnosis not present

## 2024-01-16 DIAGNOSIS — Z931 Gastrostomy status: Secondary | ICD-10-CM | POA: Diagnosis not present

## 2024-01-16 DIAGNOSIS — I10 Essential (primary) hypertension: Secondary | ICD-10-CM | POA: Diagnosis not present

## 2024-01-22 DIAGNOSIS — R1312 Dysphagia, oropharyngeal phase: Secondary | ICD-10-CM | POA: Diagnosis not present

## 2024-01-22 DIAGNOSIS — B370 Candidal stomatitis: Secondary | ICD-10-CM | POA: Diagnosis not present

## 2024-01-22 DIAGNOSIS — Z931 Gastrostomy status: Secondary | ICD-10-CM | POA: Diagnosis not present

## 2024-01-25 ENCOUNTER — Encounter: Payer: Self-pay | Admitting: Advanced Practice Midwife

## 2024-01-30 DIAGNOSIS — Z7901 Long term (current) use of anticoagulants: Secondary | ICD-10-CM | POA: Diagnosis not present

## 2024-01-30 DIAGNOSIS — I10 Essential (primary) hypertension: Secondary | ICD-10-CM | POA: Diagnosis not present

## 2024-01-30 DIAGNOSIS — I69354 Hemiplegia and hemiparesis following cerebral infarction affecting left non-dominant side: Secondary | ICD-10-CM | POA: Diagnosis not present

## 2024-02-05 DIAGNOSIS — I10 Essential (primary) hypertension: Secondary | ICD-10-CM | POA: Diagnosis not present

## 2024-02-05 DIAGNOSIS — R222 Localized swelling, mass and lump, trunk: Secondary | ICD-10-CM | POA: Diagnosis not present

## 2024-02-05 DIAGNOSIS — I69354 Hemiplegia and hemiparesis following cerebral infarction affecting left non-dominant side: Secondary | ICD-10-CM | POA: Diagnosis not present

## 2024-02-05 DIAGNOSIS — Z7901 Long term (current) use of anticoagulants: Secondary | ICD-10-CM | POA: Diagnosis not present

## 2024-02-05 DIAGNOSIS — I4819 Other persistent atrial fibrillation: Secondary | ICD-10-CM | POA: Diagnosis not present

## 2024-02-05 DIAGNOSIS — E785 Hyperlipidemia, unspecified: Secondary | ICD-10-CM | POA: Diagnosis not present

## 2024-02-07 DIAGNOSIS — R222 Localized swelling, mass and lump, trunk: Secondary | ICD-10-CM | POA: Diagnosis not present

## 2024-02-07 DIAGNOSIS — I10 Essential (primary) hypertension: Secondary | ICD-10-CM | POA: Diagnosis not present

## 2024-02-07 DIAGNOSIS — Z7901 Long term (current) use of anticoagulants: Secondary | ICD-10-CM | POA: Diagnosis not present

## 2024-02-07 DIAGNOSIS — Z931 Gastrostomy status: Secondary | ICD-10-CM | POA: Diagnosis not present

## 2024-02-07 DIAGNOSIS — E0590 Thyrotoxicosis, unspecified without thyrotoxic crisis or storm: Secondary | ICD-10-CM | POA: Diagnosis not present

## 2024-02-07 DIAGNOSIS — R1312 Dysphagia, oropharyngeal phase: Secondary | ICD-10-CM | POA: Diagnosis not present

## 2024-02-07 DIAGNOSIS — I4819 Other persistent atrial fibrillation: Secondary | ICD-10-CM | POA: Diagnosis not present

## 2024-02-07 DIAGNOSIS — E785 Hyperlipidemia, unspecified: Secondary | ICD-10-CM | POA: Diagnosis not present

## 2024-02-07 DIAGNOSIS — I69354 Hemiplegia and hemiparesis following cerebral infarction affecting left non-dominant side: Secondary | ICD-10-CM | POA: Diagnosis not present

## 2024-02-08 ENCOUNTER — Ambulatory Visit: Payer: Self-pay

## 2024-02-08 ENCOUNTER — Telehealth: Payer: Self-pay

## 2024-02-08 NOTE — Telephone Encounter (Signed)
 FYI Only or Action Required?: Action required by provider: update on patient condition.  Patient was last seen in primary care on 08/14/2023 by Georgina Speaks, FNP.  Called Nurse Triage reporting Nasal Congestion./wheezing, cough  Symptoms began several days ago.  Interventions attempted: Nothing.  Symptoms are: gradually worsening.  Triage Disposition: Go to ED Now (or PCP Triage)  Patient/caregiver understands and will follow disposition?: Yes   Copied from CRM #[id number]. Topic: Clinical - Medication Question >> Feb 08, 2024  5:56 PM Delon HERO wrote: Reason for CRM:  Patient daugther called in stating patient is having some congestion, wanted to know what she can get or take for the medication. Also wanted to speak someone about starting palliative care for patient Reason for Disposition  Patient sounds very sick or weak to the triager  Answer Assessment - Initial Assessment Questions 1. ONSET: When did the cough begin?      Three days 2. SEVERITY: How bad is the cough today?      Coughs during the day and the cough keeps her up at night 3. SPUTUM: Describe the color of your sputum (e.g., none, dry cough; clear, white, yellow, green)     Daughter has not seen anything come up 4. HEMOPTYSIS: Are you coughing up any blood? If Yes, ask: How much? (e.g., flecks, streaks, tablespoons, etc.)     denies 5. DIFFICULTY BREATHING: Are you having difficulty breathing? If Yes, ask: How bad is it? (e.g., mild, moderate, severe)      Reports that she can hear her mother wheezing when she breathes 6. FEVER: Do you have a fever? If Yes, ask: What is your temperature, how was it measured, and when did it start?     Has not been measured, but possibly having chills  9. PE RISK FACTORS: Do you have a history of blood clots? (or: recent major surgery, recent prolonged travel, bedridden)     Recent stroke, immobile 10. OTHER SYMPTOMS: Do you have any other symptoms? (e.g.,  runny nose, wheezing, chest pain)   Patient with recent stroke, decreased mobility, recent d/c from rehab with chills, cough, and wheezing. Recommend ED via EMS.  Patient's daughter verbalizes understanding       Runny nose and wheezing 11. PREGNANCY: Is there any chance you are pregnant? When was your last menstrual period?       N/A 12. TRAVEL: Have you traveled out of the country in the last month? (e.g., travel history, exposures)       N/a  Protocols used: Cough - Acute Non-Productive-A-AH

## 2024-02-08 NOTE — Telephone Encounter (Signed)
 Copied from CRM [id number]. Topic: Clinical - Medical Advice >> Feb 08, 2024  8:06 AM Thersia BROCKS wrote: Reason for CRM: Patient daugther called in stating patient is having some congestion, wanted to know what she can get or take for the medication. Also wanted to speak someone about starting palliative care for patient  LVM FOR PT WITH INFORMATION- SHE WAS ADVISED TO TRY A URGENT  CARE AND SHE COULD DISCUSS PALLIATIVE CARE WITH PCP AT VISIT. Hermina Barnard, CMA

## 2024-02-09 DIAGNOSIS — Z431 Encounter for attention to gastrostomy: Secondary | ICD-10-CM | POA: Diagnosis not present

## 2024-02-09 DIAGNOSIS — M0589 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: Secondary | ICD-10-CM | POA: Diagnosis not present

## 2024-02-09 DIAGNOSIS — N1831 Chronic kidney disease, stage 3a: Secondary | ICD-10-CM | POA: Diagnosis not present

## 2024-02-09 DIAGNOSIS — I129 Hypertensive chronic kidney disease with stage 1 through stage 4 chronic kidney disease, or unspecified chronic kidney disease: Secondary | ICD-10-CM | POA: Diagnosis not present

## 2024-02-09 DIAGNOSIS — D631 Anemia in chronic kidney disease: Secondary | ICD-10-CM | POA: Diagnosis not present

## 2024-02-09 DIAGNOSIS — R1312 Dysphagia, oropharyngeal phase: Secondary | ICD-10-CM | POA: Diagnosis not present

## 2024-02-09 DIAGNOSIS — I4819 Other persistent atrial fibrillation: Secondary | ICD-10-CM | POA: Diagnosis not present

## 2024-02-09 DIAGNOSIS — I69354 Hemiplegia and hemiparesis following cerebral infarction affecting left non-dominant side: Secondary | ICD-10-CM | POA: Diagnosis not present

## 2024-02-09 DIAGNOSIS — E871 Hypo-osmolality and hyponatremia: Secondary | ICD-10-CM | POA: Diagnosis not present

## 2024-02-11 ENCOUNTER — Telehealth: Payer: Self-pay

## 2024-02-14 ENCOUNTER — Ambulatory Visit: Payer: Self-pay | Admitting: Nurse Practitioner

## 2024-03-10 DIAGNOSIS — 419620001 Death: Secondary | SNOMED CT | POA: Diagnosis not present

## 2024-03-10 NOTE — Telephone Encounter (Signed)
 Spoke with [employer] police department -  They stated Priscilla Thornton had passed away at her daughter's house 02/11/2024 @7 :50am of natural causes.

## 2024-03-10 NOTE — Telephone Encounter (Unsigned)
 Copied from CRM [id number]. Topic: General - Other >> 2024-02-18  8:17 AM Delon HERO wrote: Reason for CRM: Officer Dziendziel calling from [facility] is calling to report that the patient passed away 07;50a today 02-18-2024 of natural causes. Would like to know if [place] would sign the death certificate. CB- [phone]

## 2024-03-10 DEATH — deceased

## 2024-04-23 ENCOUNTER — Encounter: Payer: Self-pay | Admitting: Nurse Practitioner
